# Patient Record
Sex: Female | Born: 1980 | Race: White | Hispanic: No | Marital: Married | Smoking: Current every day smoker
Health system: Southern US, Community
[De-identification: ages and names within clinical notes are randomized; demographics above are authoritative.]

## PROBLEM LIST (undated history)

## (undated) DIAGNOSIS — T4145XA Adverse effect of unspecified anesthetic, initial encounter: Secondary | ICD-10-CM

## (undated) DIAGNOSIS — F319 Bipolar disorder, unspecified: Secondary | ICD-10-CM

## (undated) DIAGNOSIS — E7800 Pure hypercholesterolemia, unspecified: Secondary | ICD-10-CM

## (undated) DIAGNOSIS — F419 Anxiety disorder, unspecified: Secondary | ICD-10-CM

## (undated) DIAGNOSIS — E079 Disorder of thyroid, unspecified: Secondary | ICD-10-CM

## (undated) DIAGNOSIS — T8859XA Other complications of anesthesia, initial encounter: Secondary | ICD-10-CM

## (undated) DIAGNOSIS — R51 Headache: Secondary | ICD-10-CM

## (undated) DIAGNOSIS — K219 Gastro-esophageal reflux disease without esophagitis: Secondary | ICD-10-CM

## (undated) DIAGNOSIS — F53 Postpartum depression: Secondary | ICD-10-CM

## (undated) DIAGNOSIS — O99345 Other mental disorders complicating the puerperium: Secondary | ICD-10-CM

## (undated) DIAGNOSIS — K589 Irritable bowel syndrome without diarrhea: Secondary | ICD-10-CM

## (undated) DIAGNOSIS — I10 Essential (primary) hypertension: Secondary | ICD-10-CM

## (undated) DIAGNOSIS — E119 Type 2 diabetes mellitus without complications: Secondary | ICD-10-CM

## (undated) DIAGNOSIS — G43909 Migraine, unspecified, not intractable, without status migrainosus: Secondary | ICD-10-CM

## (undated) DIAGNOSIS — F4310 Post-traumatic stress disorder, unspecified: Secondary | ICD-10-CM

## (undated) HISTORY — DX: Migraine, unspecified, not intractable, without status migrainosus: G43.909

## (undated) HISTORY — PX: ORIF KNEE DISLOCATION: SUR938

## (undated) HISTORY — PX: LAPAROSCOPIC PARTIAL COLECTOMY: SHX5907

---
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
## 2000-08-09 ENCOUNTER — Emergency Department (HOSPITAL_COMMUNITY): Admission: EM | Admit: 2000-08-09 | Discharge: 2000-08-09 | Payer: Self-pay | Admitting: *Deleted

## 2000-08-09 ENCOUNTER — Encounter: Payer: Self-pay | Admitting: *Deleted

## 2000-08-20 ENCOUNTER — Encounter (HOSPITAL_COMMUNITY): Admission: RE | Admit: 2000-08-20 | Discharge: 2000-09-19 | Payer: Self-pay | Admitting: Family Medicine

## 2000-08-21 ENCOUNTER — Emergency Department (HOSPITAL_COMMUNITY): Admission: EM | Admit: 2000-08-21 | Discharge: 2000-08-21 | Payer: Self-pay | Admitting: Emergency Medicine

## 2001-03-01 ENCOUNTER — Emergency Department (HOSPITAL_COMMUNITY): Admission: EM | Admit: 2001-03-01 | Discharge: 2001-03-01 | Payer: Self-pay | Admitting: Emergency Medicine

## 2002-09-07 ENCOUNTER — Emergency Department (HOSPITAL_COMMUNITY): Admission: EM | Admit: 2002-09-07 | Discharge: 2002-09-08 | Payer: Self-pay | Admitting: *Deleted

## 2005-06-27 ENCOUNTER — Emergency Department (HOSPITAL_COMMUNITY): Admission: EM | Admit: 2005-06-27 | Discharge: 2005-06-27 | Payer: Self-pay | Admitting: Emergency Medicine

## 2005-11-14 ENCOUNTER — Other Ambulatory Visit: Admission: RE | Admit: 2005-11-14 | Discharge: 2005-11-14 | Payer: Self-pay | Admitting: Emergency Medicine

## 2007-06-17 ENCOUNTER — Other Ambulatory Visit: Admission: RE | Admit: 2007-06-17 | Discharge: 2007-06-17 | Payer: Self-pay | Admitting: Internal Medicine

## 2007-09-30 ENCOUNTER — Emergency Department (HOSPITAL_COMMUNITY): Admission: EM | Admit: 2007-09-30 | Discharge: 2007-09-30 | Payer: Self-pay | Admitting: Emergency Medicine

## 2007-10-27 ENCOUNTER — Ambulatory Visit: Payer: Self-pay | Admitting: Orthopedic Surgery

## 2007-10-27 DIAGNOSIS — M25519 Pain in unspecified shoulder: Secondary | ICD-10-CM | POA: Insufficient documentation

## 2007-10-27 DIAGNOSIS — S139XXA Sprain of joints and ligaments of unspecified parts of neck, initial encounter: Secondary | ICD-10-CM | POA: Insufficient documentation

## 2007-10-30 ENCOUNTER — Encounter: Payer: Self-pay | Admitting: Orthopedic Surgery

## 2007-10-30 ENCOUNTER — Telehealth: Payer: Self-pay | Admitting: Orthopedic Surgery

## 2007-12-01 ENCOUNTER — Encounter: Payer: Self-pay | Admitting: Orthopedic Surgery

## 2008-10-10 ENCOUNTER — Ambulatory Visit (HOSPITAL_COMMUNITY): Admission: RE | Admit: 2008-10-10 | Discharge: 2008-10-10 | Payer: Self-pay | Admitting: Internal Medicine

## 2008-11-28 ENCOUNTER — Emergency Department (HOSPITAL_COMMUNITY): Admission: EM | Admit: 2008-11-28 | Discharge: 2008-11-28 | Payer: Self-pay | Admitting: Emergency Medicine

## 2008-12-21 ENCOUNTER — Ambulatory Visit (HOSPITAL_COMMUNITY): Admission: RE | Admit: 2008-12-21 | Discharge: 2008-12-21 | Payer: Self-pay | Admitting: Internal Medicine

## 2008-12-28 ENCOUNTER — Ambulatory Visit (HOSPITAL_COMMUNITY): Admission: RE | Admit: 2008-12-28 | Discharge: 2008-12-28 | Payer: Self-pay | Admitting: Internal Medicine

## 2009-01-11 ENCOUNTER — Other Ambulatory Visit: Admission: RE | Admit: 2009-01-11 | Discharge: 2009-01-11 | Payer: Self-pay | Admitting: Internal Medicine

## 2009-08-14 ENCOUNTER — Other Ambulatory Visit: Admission: RE | Admit: 2009-08-14 | Discharge: 2009-08-14 | Payer: Self-pay | Admitting: Obstetrics & Gynecology

## 2010-01-07 DIAGNOSIS — O99345 Other mental disorders complicating the puerperium: Secondary | ICD-10-CM

## 2010-01-07 DIAGNOSIS — F53 Postpartum depression: Secondary | ICD-10-CM

## 2010-01-07 HISTORY — DX: Postpartum depression: F53.0

## 2010-01-07 HISTORY — DX: Other mental disorders complicating the puerperium: O99.345

## 2011-11-05 ENCOUNTER — Ambulatory Visit (HOSPITAL_COMMUNITY)
Admission: RE | Admit: 2011-11-05 | Discharge: 2011-11-05 | Disposition: A | Discharge status: Home / Self Care | Payer: Medicaid Other | Source: Ambulatory Visit | Attending: Neurology | Admitting: Neurology

## 2011-11-05 DIAGNOSIS — M6281 Muscle weakness (generalized): Secondary | ICD-10-CM | POA: Insufficient documentation

## 2011-11-05 DIAGNOSIS — M25519 Pain in unspecified shoulder: Secondary | ICD-10-CM | POA: Insufficient documentation

## 2011-11-05 DIAGNOSIS — IMO0001 Reserved for inherently not codable concepts without codable children: Secondary | ICD-10-CM | POA: Insufficient documentation

## 2011-11-05 NOTE — Evaluation (Signed)
Occupational Therapy Evaluation  Patient Details  Name: Isabella Walker Patient MRN: [id number] Date of Birth: 14-Jan-1980  Today's Date: 11/05/2011 Time: 1110-1150 OT Time Calculation (min): 40 min OT Evaluation  40' Visit#: 1  of 4   Re-eval: 11/26/11  Assessment Diagnosis: Bilateral Shoulder Pain Next MD Visit: 11/26/11 Prior Therapy: n/a  Authorization: Medicaid - 3 visits today is eval, begin counting visits next session  Authorization Time Period: through 11/26/11  Authorization Visit#: 0  of 3    Past Medical History: No past medical history on file. Past Surgical History: No past surgical history on file.  Subjective S:  I have been having pain in both of my shoulders and it causes headaches.  I think it is bursitis. Pertinent History: [name] states that her shoulders began hurting and losing mobility a few weeks ago.  She consulted with her MD, and was diagnosed with bilateral shoulder bursitis.  She recieved cortisone injections in both shoulders, which has alleviated the pain minimally.  She has been referred to occupational therapy for evaluation and treatment.   Special Tests: UEFI:  44/80 = 55% Patient Stated Goals: I want to try and get the pain to go away. 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  Assessment ADL/Vision/Perception ADL ADL Comments: Any activity  that involves reaching overhead or lifting is painful.  Experience pain when washing dishes and pain when picking up her son Dominant Hand: Right Vision - History Baseline Vision: No visual deficits  Cognition/Observation Cognition Overall Cognitive Status: Appears within functional limits for tasks assessed  Sensation/Coordination/Edema Sensation Light Touch: Appears Intact Coordination Gross Motor Movements are Fluid and Coordinated: Yes Fine Motor Movements are Fluid and Coordinated: Yes  Additional Assessments RUE AROM (degrees) RUE Overall AROM Comments: assessed in seated.  ER/IR with shoulder adducted Right Shoulder Flexion: 131 Degrees Right Shoulder ABduction: 90 Degrees Right Shoulder Internal Rotation: 70 Degrees Right Shoulder External Rotation: 35 Degrees RUE PROM (degrees) RUE Overall PROM Comments: Assessed in supine, 75% RUE Strength RUE Overall Strength Comments: Assessed in seated Right Shoulder Flexion: 4/5 Right Shoulder ABduction: 4/5 Right Shoulder Internal Rotation: 4/5 Right Shoulder External Rotation: 4/5 LUE AROM (degrees) LUE Overall AROM Comments: assessed in seated.  ER/IR with shoulder adducted Left Shoulder Flexion: 110 Degrees Left Shoulder ABduction: 86 Degrees Left Shoulder Internal Rotation: 84 Degrees Left Shoulder External Rotation: 30 Degrees LUE PROM (degrees) LUE Overall PROM Comments: assessed in supine 75% LUE Strength LUE Overall Strength Comments: assessed in seated ER/IR with shoulder adducted  Left Shoulder Flexion: 4/5 Left Shoulder ABduction: 4/5 Left Shoulder Internal Rotation: 4/5 Left Shoulder External Rotation: 4/5 Palpation Palpation: Min-moderate fascial restrictions in scapular region of bilateral shoulders     Exercise/Treatments    Manual Therapy Manual Therapy: Myofascial release Myofascial Release: MFR and manual massage to determine fascial restrictions and range of motion limitations.  Occupational  Therapy Assessment and Plan OT Assessment and Plan Clinical Impression Statement: A:  31 year old with bilateral scapular spasms/fascial restrictions causing  limited mobility and increased pain in her bilateral shoulders.  These deficits are causing decreased I with all B/IADL, leisure, and work activities. Pt will benefit from skilled therapeutic intervention in order to improve on the following deficits: Impaired UE functional use;Increased muscle spasms;Increased fascial restricitons;Decreased range of motion;Decreased strength;Pain Rehab Potential: Good OT Frequency: Min 1X/week OT Duration: 4 weeks OT Treatment/Interventions: Self-care/ADL training;Manual therapy;Therapeutic exercise;Modalities;Therapeutic activities;Patient/family education OT Plan: P:  Skilled OT intervention to decrease pain and restrictions in bilateral scapular regions and increase functional use of BUE during ADLs, work, and leisure activities.  Treatment Plan:  MFR and manual stretching to bilateral shoulder regions to decrease pain and restrictions.  Therapeutic Exercises:  AAROM and PROM in supine.  seated scapular AROM.  ball stretches, wall wash, thumb tacks, prot/ret//elev/dep.  Progress as tolerated.     Goals Short Term Goals Time to Complete Short Term Goals: 2 weeks Short Term Goal 1: Patient will be educated on a HEP. Short Term Goal 2: Patient will increase PROM to [facility] in bilateral shoulders for increased ability to reach into overhead cabinets. Short Term Goal 3: Patient will decrease pain in her bilateral shoulders to 5/10 when picking up her son. Long Term Goals Time to Complete Long Term Goals: 4 weeks Long Term Goal 1: Patient will be I with HEP for AROM and scapular stability exercises. Long Term Goal 2: Patient will increase bilateral shoulder AROM to [facility] for increased ability to reach into overhead cabinets at work. 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 Long Term Goal 4: Patient will decrease fascial restrictions to minimal in her bilateral scapular regions. Long Term Goal 5: Patient will decrease pain in her bilateral shoulders to 3/10 when lifting her son or infants at work.  Problem List Patient Active Problem List  Diagnosis  . SHOULDER PAIN  . CERVICAL SPASM  . Muscle weakness (generalized)    End of Session Activity Tolerance: Patient tolerated treatment well General Behavior During Session: [facility] for tasks performed Cognition: [facility] for tasks performed OT Plan of Care OT Home Exercise Plan: Educated patient on self massage with tennis ball, dowel rod exercises in supine, and shoulder stretches. Consulted and Agree with Plan of Care: Patient  GO    Shirlean Mylar, OTR/L  11/05/2011, 12:17 PM  Physician Documentation Your signature is required to indicate approval of the treatment plan as stated above.  Please sign and either send electronically or make a copy of this report for your files and return this physician signed original.  Please mark one 1.__approve of plan  2. ___approve of plan with the following conditions.   ______________________________                                                          _____________________ Physician Signature  Date  

## 2011-11-12 ENCOUNTER — Ambulatory Visit (HOSPITAL_COMMUNITY)
Admission: RE | Admit: 2011-11-12 | Discharge: 2011-11-12 | Disposition: A | Discharge status: Home / Self Care | Payer: Medicaid Other | Source: Ambulatory Visit | Attending: Neurology | Admitting: Neurology

## 2011-11-12 DIAGNOSIS — IMO0001 Reserved for inherently not codable concepts without codable children: Secondary | ICD-10-CM | POA: Insufficient documentation

## 2011-11-12 DIAGNOSIS — M25519 Pain in unspecified shoulder: Secondary | ICD-10-CM | POA: Insufficient documentation

## 2011-11-12 DIAGNOSIS — M6281 Muscle weakness (generalized): Secondary | ICD-10-CM | POA: Insufficient documentation

## 2011-11-12 NOTE — Progress Notes (Signed)
Occupational Therapy Treatment Patient Details  Name: Isabella Walker Patient MRN: [id number] Date of Birth: 12/18/1980  Today's Date: 11/12/2011 Time: 7829-5621 OT Time Calculation (min): 47 min Manual Therapy 3086-5784 27' Therapeutic Exercise 1130-1149 19'  Visit#: 2  of 4   Re-eval: 11/26/11    Authorization: Medicaid - 3 visits today is eval, begin counting visits next session   Authorization Time Period: through 11/26/11   Authorization Visit#: 1  of 3   Subjective Symptoms/Limitations Symptoms: S:  They hurt today, that may be why I have a headache.  The right is worse. Pain Assessment Currently in Pain?: Yes Pain Score:   8 Pain Location: Shoulder Pain Orientation: Right Pain Type: Acute pain Multiple Pain Sites: Yes  Precautions/Restrictions     Exercise/Treatments Supine Protraction: PROM;AAROM;10 reps;Both Horizontal ABduction: PROM;AAROM;Both;10 reps External Rotation: PROM;AAROM;Both;10 reps Internal Rotation: PROM;AAROM;Both;10 reps Flexion: PROM;AAROM;Both;10 reps ABduction: PROM;AAROM;Both;10 reps Seated Elevation: AROM;10 reps Extension: AROM;10 reps Row: AROM;10 reps   Therapy Ball Flexion: 15 reps ABduction: 15 reps;Other (comment) (bilateral UE)      Manual Therapy Manual Therapy: Myofascial release Myofascial Release: MFR and manual stretching to B UE along scapula, upper trap and upper arm with focus to decrease pain and restrictions to decrease pain and increase AROM  Occupational Therapy Assessment and Plan OT Assessment and Plan Clinical Impression Statement: A:  Added multple new exercises which patient tolerated well.  Patient with pain decreased to 5/10 on left and headache almost gone after manual. OT Plan: P:  Add wall wash, thumbtacks and pro/ret/elev/dep.   Goals Short Term Goals Time to Complete Short Term Goals: 2 weeks Short Term Goal 1: Patient will be educated on a HEP. Short Term Goal 2: Patient will increase PROM to [facility] in  bilateral shoulders for increased ability to reach into overhead cabinets. Short Term Goal 3: Patient will decrease pain in her bilateral shoulders to 5/10 when picking up her son. Long Term Goals Time to Complete Long Term Goals: 4 weeks Long Term Goal 1: Patient will be I with HEP for AROM and scapular stability exercises. Long Term Goal 2: Patient will increase bilateral shoulder AROM to [facility] for increased ability to reach into overhead cabinets at work. 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 Long Term Goal 4: Patient will decrease fascial restrictions to minimal in her bilateral scapular regions. Long Term Goal 5: Patient will decrease pain in her bilateral shoulders to 3/10 when lifting her son or infants at work.  Problem List Patient Active Problem List  Diagnosis  . SHOULDER PAIN  . CERVICAL SPASM  . Muscle weakness (generalized)    End of Session Activity Tolerance: Patient tolerated treatment well General Behavior During Session: [facility] for tasks performed Cognition: [facility] for tasks performed  GO    Noralee Stain, Arianny Pun L 11/12/2011, 11:49 AM

## 2011-11-19 ENCOUNTER — Ambulatory Visit (HOSPITAL_COMMUNITY)
Admission: RE | Admit: 2011-11-19 | Discharge: 2011-11-19 | Disposition: A | Discharge status: Home / Self Care | Payer: Medicaid Other | Source: Ambulatory Visit | Attending: Neurology | Admitting: Neurology

## 2011-11-19 DIAGNOSIS — M6281 Muscle weakness (generalized): Secondary | ICD-10-CM

## 2011-11-19 NOTE — Progress Notes (Signed)
Occupational Therapy Treatment Patient Details  Name: Isabella Walker Patient MRN: [id number] Date of Birth: 25-Jul-1980  Today's Date: 11/19/2011 Time: 1110-1207 OT Time Calculation (min): 57 min Manual Therapy 1110-1136 26' Therapeutic Exercise 1137-1207 30'  Visit#: 3  of 4   Re-eval: 11/26/11    Authorization: Medicaid   Authorization Time Period:    Authorization Visit#: 2  of 3   Subjective Symptoms/Limitations Symptoms: S: I don't have any pain just tight and sore.  Precautions/Restrictions     Exercise/Treatments Supine Protraction: PROM;AAROM;Both;12 reps Horizontal ABduction: PROM;AAROM;Both;12 reps External Rotation: PROM;AAROM;Both;12 reps Internal Rotation: PROM;AAROM;Both;12 reps Flexion: PROM;AAROM;Both;12 reps ABduction: PROM;AAROM;Both;12 reps Therapy Ball Flexion: 20 reps ABduction: 20 reps    Modalities Modalities: Cryotherapy Manual Therapy Manual Therapy: Myofascial release Myofascial Release: MFR and manual stretching to B UE along scapula, upper trap and upper arm with focus to decrease pain and restrictions to decrease pain and increase AROM   Occupational Therapy Assessment and Plan OT Assessment and Plan Clinical Impression Statement: A : Palpable spasms felt in bilateral border of scapula.  Added wall wash, prot/ret/elev/dep and thumbtacks today which patient tolerated well. Patient given handout concerning Medicaid and being selfpay after 3 visits, patient also give Kathie Rhodes Ratliff's number to contact for assistance Rehab Potential: Good OT Plan: P:  Switch to AROM bilateral since patient has full PROM, add tband for scapular strengthening if no spasms.   Goals Short Term Goals Time to Complete Short Term Goals: 2 weeks Short Term Goal 1: Patient will be educated on a HEP. Short Term Goal 2: Patient will increase PROM to [facility] in bilateral shoulders for increased ability to reach into overhead cabinets. Short Term Goal 3: Patient will decrease pain  in her bilateral shoulders to 5/10 when picking up her son. Long Term Goals Time to Complete Long Term Goals: 4 weeks Long Term Goal 1: Patient will be I with HEP for AROM and scapular stability exercises. Long Term Goal 2: Patient will increase bilateral shoulder AROM to [facility] for increased ability to reach into overhead cabinets at work. 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 Long Term Goal 4: Patient will decrease fascial restrictions to minimal in her bilateral scapular regions. Long Term Goal 5: Patient will decrease pain in her bilateral shoulders to 3/10 when lifting her son or infants at work.  Problem List Patient Active Problem List  Diagnosis  . SHOULDER PAIN  . CERVICAL SPASM  . Muscle weakness (generalized)    End of Session Activity Tolerance: Patient tolerated treatment well General Behavior During Session: [facility] for tasks performed Cognition: [facility] for tasks performed  GO    Noralee Stain, [place] Burgin L 11/19/2011, 2:51 PM

## 2011-11-25 ENCOUNTER — Inpatient Hospital Stay (HOSPITAL_COMMUNITY): Admission: RE | Admit: 2011-11-25 | Payer: Medicaid Other | Source: Ambulatory Visit | Admitting: Occupational Therapy

## 2011-11-26 ENCOUNTER — Ambulatory Visit (HOSPITAL_COMMUNITY): Payer: Medicaid Other | Admitting: Specialist

## 2011-11-29 ENCOUNTER — Ambulatory Visit (HOSPITAL_COMMUNITY)
Admission: RE | Admit: 2011-11-29 | Discharge: 2011-11-29 | Disposition: A | Discharge status: Home / Self Care | Payer: Medicaid Other | Source: Ambulatory Visit | Attending: Neurology | Admitting: Neurology

## 2011-11-29 NOTE — Progress Notes (Signed)
Occupational Therapy Treatment Patient Details  Name: Isabella Walker Patient MRN: [id number] Date of Birth: 1980-11-25  Today's Date: 11/29/2011 Time: 0865-7846 OT Time Calculation (min): 34 min Manual Therapy [phone] 13' Therapeutic Exercises/HEP Instruction [phone] 60' Visit#: 4  of 4   Re-eval: 12/27/11    Authorization: Medicaid  Authorization Time Period: authorization has expired  Authorization Visit#: 3  of 3   Subjective  S:  Im feeling pretty good.  I am applying for the financial assistance through the hospital.  I want to work on some exercises at home, until I hear from them Pain Assessment Currently in Pain?: Other (Comment) (just soreness/tightness in bilateral rhomoboids.)  Precautions/Restrictions   progress as tolerated  Exercise/Treatments Supine Protraction: PROM;10 reps Horizontal ABduction: PROM;10 reps External Rotation: PROM;10 reps Internal Rotation: PROM;10 reps Flexion: PROM;10 reps ABduction: PROM;10 reps Other Supine Exercises: bilateral serratus anterior punch x 10 Seated Extension: Theraband;10 reps Theraband Level (Shoulder Extension): Level 3 (Green) Retraction: Theraband;10 reps Theraband Level (Shoulder Retraction): Level 3 (Green) Row: Theraband;10 reps Theraband Level (Shoulder Row): Level 3 (Green) ROM / Strengthening / Isometric Strengthening "W" Arms: 10 reps with cuing to use rhomboids X to V Arms: 10 reps with vg to use rhomboids Proximal Shoulder Strengthening, Seated: BUE 10 reps completed entire sequence without resting.    Stretches Other Shoulder Stretches: educated on bilateral cervical stretches, upper trap stretch, neck retraction, and levator scapulae completed 1 rep of each holding 5 seconds each and added to HEP    Manual Therapy Manual Therapy: Myofascial release Myofascial Release: MFR and manual stretching to B UE along scapula, upper trap and upper arm with focus to decrease pain and restrictions to decrease pain and  increase AROM [phone]   Occupational Therapy Assessment and Plan OT Assessment and Plan Clinical Impression Statement: A:  Significant decrease in pain and restrictions in bilateral scapular regions this date.  Please see progress note in letter section of this chart for details.  Patients therapy will be placed on hold until she determines if she is eligible for financial discount.  Patient would benefit from continued skilled OT services to increase scapular stability and shoulder strength to WNL.   OT Plan: P:  Hold therapy until financial eligibility is determined, then continue treatment 1 x week x 4 weeks to increase strength and scapular stability to WNL.   Goals Short Term Goals Time to Complete Short Term Goals: 2 weeks Short Term Goal 1: Patient will be educated on a HEP. Short Term Goal 1 Progress: Met Short Term Goal 2: Patient will increase PROM to [facility] in bilateral shoulders for increased ability to reach into overhead cabinets. Short Term Goal 2 Progress: Met Short Term Goal 3: Patient will decrease pain in her bilateral shoulders to 5/10 when picking up her son. Short Term Goal 3 Progress: Met Long Term Goals Time to Complete Long Term Goals: 4 weeks Long Term Goal 1: Patient will be I with HEP for AROM and scapular stability exercises. Long Term Goal 1 Progress: Progressing toward goal Long Term Goal 2: Patient will increase bilateral shoulder AROM to [facility] for increased ability to reach into overhead cabinets at work. 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 Long Term Goal 3 Progress: Progressing toward goal Long Term Goal 4: Patient will decrease fascial restrictions to minimal in her bilateral scapular regions. Long Term Goal 4 Progress: Met  Long Term Goal 5: Patient will decrease pain in her bilateral shoulders to 3/10 when lifting her son or  infants at work. Long Term Goal 5 Progress: Met  Problem List Patient Active Problem List  Diagnosis  . SHOULDER PAIN  . CERVICAL SPASM  . Muscle weakness (generalized)    End of Session Activity Tolerance: Patient tolerated treatment well General Behavior During Session: [facility] for tasks performed Cognition: [facility] for tasks performed OT Plan of Care OT Home Exercise Plan: Educated patient on cervical stretches, scapular stability exercises, and theraband for scapular stability for home.  Consulted and Agree with Plan of Care: Patient    Shirlean Mylar, OTR/L  11/29/2011, 9:21 AM

## 2012-04-15 ENCOUNTER — Ambulatory Visit (HOSPITAL_COMMUNITY)
Admission: RE | Admit: 2012-04-15 | Discharge: 2012-04-15 | Disposition: A | Discharge status: Home / Self Care | Payer: Medicaid Other | Source: Ambulatory Visit | Attending: Nurse Practitioner | Admitting: Nurse Practitioner

## 2012-04-15 ENCOUNTER — Other Ambulatory Visit (HOSPITAL_COMMUNITY): Payer: Self-pay | Admitting: Nurse Practitioner

## 2012-04-15 DIAGNOSIS — M542 Cervicalgia: Secondary | ICD-10-CM

## 2012-04-15 DIAGNOSIS — M549 Dorsalgia, unspecified: Secondary | ICD-10-CM | POA: Insufficient documentation

## 2012-05-01 ENCOUNTER — Encounter (HOSPITAL_COMMUNITY): Payer: Self-pay

## 2012-05-01 ENCOUNTER — Emergency Department (HOSPITAL_COMMUNITY): Payer: Medicaid Other

## 2012-05-01 ENCOUNTER — Emergency Department (HOSPITAL_COMMUNITY)
Admission: EM | Admit: 2012-05-01 | Discharge: 2012-05-01 | Disposition: A | Discharge status: Home / Self Care | Payer: Medicaid Other | Attending: Emergency Medicine | Admitting: Emergency Medicine

## 2012-05-01 DIAGNOSIS — E669 Obesity, unspecified: Secondary | ICD-10-CM | POA: Insufficient documentation

## 2012-05-01 DIAGNOSIS — K219 Gastro-esophageal reflux disease without esophagitis: Secondary | ICD-10-CM | POA: Insufficient documentation

## 2012-05-01 DIAGNOSIS — R109 Unspecified abdominal pain: Secondary | ICD-10-CM | POA: Insufficient documentation

## 2012-05-01 DIAGNOSIS — Z79899 Other long term (current) drug therapy: Secondary | ICD-10-CM | POA: Insufficient documentation

## 2012-05-01 DIAGNOSIS — R52 Pain, unspecified: Secondary | ICD-10-CM | POA: Insufficient documentation

## 2012-05-01 DIAGNOSIS — Z8742 Personal history of other diseases of the female genital tract: Secondary | ICD-10-CM | POA: Insufficient documentation

## 2012-05-01 DIAGNOSIS — F17200 Nicotine dependence, unspecified, uncomplicated: Secondary | ICD-10-CM | POA: Insufficient documentation

## 2012-05-01 DIAGNOSIS — Z3202 Encounter for pregnancy test, result negative: Secondary | ICD-10-CM | POA: Insufficient documentation

## 2012-05-01 HISTORY — DX: Other mental disorders complicating the puerperium: O99.345

## 2012-05-01 HISTORY — DX: Gastro-esophageal reflux disease without esophagitis: K21.9

## 2012-05-01 HISTORY — DX: Postpartum depression: F53.0

## 2012-05-01 LAB — URINALYSIS, ROUTINE W REFLEX MICROSCOPIC
Glucose, UA: NEGATIVE mg/dL
Protein, ur: NEGATIVE mg/dL
pH: 5.5 (ref 5.0–8.0)

## 2012-05-01 LAB — URINE MICROSCOPIC-ADD ON

## 2012-05-01 MED ORDER — TRAMADOL HCL 50 MG PO TABS: 50.0000 mg | ORAL_TABLET | Freq: Three times a day (TID) | ORAL | Status: DC | PRN

## 2012-05-01 MED ORDER — TRAMADOL HCL 50 MG PO TABS
50.0000 mg | ORAL_TABLET | Freq: Once | ORAL | Status: AC
  Administered 2012-05-01: 50 mg via ORAL
  Filled 2012-05-01: qty 1

## 2012-05-01 NOTE — ED Provider Notes (Signed)
History     CSN: [id number]  Arrival date & time 05/01/12  1919   First MD Initiated Contact with Patient 05/01/12 1945      Chief Complaint  Patient presents with  . Abdominal Pain    (Consider location/radiation/quality/duration/timing/severity/associated sxs/prior treatment) HPI Patient presents with abdominal pain.  The pain began suddenly after a sneeze.  Since onset the pain has been intermittent, is a sore, throbbing sensation to the right of her umbilicus.  The pain had been improving, but today after coughing, the pain became more pronounced.  No concurrent nausea, anorexia, vomiting, fever, dyspnea, chest pain. Minimal relief with OTC medication.  No history of abdominal surgery.  Past Medical History  Diagnosis Date  . Depression complicating pregnancy, postpartum 2012  . GERD (gastroesophageal reflux disease)     History reviewed. No pertinent past surgical history.  History reviewed. No pertinent family history.  History  Substance Use Topics  . Smoking status: Current Some Day Smoker -- 1.00 packs/day for 15 years  . Smokeless tobacco: Not on file  . Alcohol Use: No    OB History   Grav Para Term Preterm Abortions TAB SAB Ect Mult Living                  Review of Systems  All other systems reviewed and are negative.    Allergies  Bextra  Home Medications   Current Outpatient Rx  Name  Route  Sig  Dispense  Refill  . butalbital-acetaminophen-caffeine (FIORICET, ESGIC) 50-325-40 MG per tablet   Oral   Take 1-2 tablets by mouth every 6 (six) hours as needed for headache.         . clonazePAM (KLONOPIN) 0.5 MG tablet   Oral   Take 0.5 mg by mouth 3 (three) times daily as needed for anxiety.         Marland Kitchen HYDROcodone-acetaminophen (NORCO/VICODIN) 5-325 MG per tablet   Oral   Take 1 tablet by mouth every 6 (six) hours as needed for pain.         Marland Kitchen loratadine (CLARITIN) 10 MG tablet   Oral   Take 10 mg by mouth every morning.          . meloxicam (MOBIC) 15 MG tablet   Oral   Take 15 mg by mouth every morning.         . naproxen sodium (ALEVE) 220 MG tablet   Oral   Take 440 mg by mouth daily as needed (FOR HEADACHE PAIN).         Marland Kitchen omeprazole (PRILOSEC) 20 MG capsule   Oral   Take 20 mg by mouth every morning.         . topiramate (TOPAMAX) 25 MG tablet   Oral   Take 25 mg by mouth 2 (two) times daily.         . traMADol (ULTRAM) 50 MG tablet   Oral   Take 50-1,000 mg by mouth every 6 (six) hours as needed for pain.         Marland Kitchen triamterene-hydrochlorothiazide (MAXZIDE-25) 37.5-25 MG per tablet   Oral   Take 1 tablet by mouth every morning.         . Vilazodone HCl (VIIBRYD) 40 MG TABS   Oral   Take 40 mg by mouth daily after supper.           BP 136/94  Pulse 88  Temp(Src) 98 F (36.7 C) (Oral)  Resp 20  Ht 5'  3" (1.6 m)  Wt 307 lb 11.2 oz (139.572 kg)  BMI 54.52 kg/m2  SpO2 97%  LMP 04/07/2012  Physical Exam  Nursing note and vitals reviewed. Constitutional: She is oriented to person, place, and time. She appears well-developed and well-nourished. No distress.  Obese female in no distress  HENT:  Head: Normocephalic and atraumatic.  Eyes: Conjunctivae and EOM are normal.  Cardiovascular: Normal rate and regular rhythm.   Pulmonary/Chest: Effort normal and breath sounds normal. No stridor. No respiratory distress.  Abdominal:  Large abdomen, nondistended, no guarding, no rebound, minimal tenderness to palpation with about an area just to the right of the umbilicus was no discernible mass, fascial defect, protuberant bowel.   Musculoskeletal: She exhibits no edema.  Neurological: She is alert and oriented to person, place, and time. No cranial nerve deficit.  Skin: Skin is warm and dry.  Psychiatric: She has a normal mood and affect.    ED Course  Procedures (including critical care time)  Labs Reviewed  URINALYSIS, ROUTINE W REFLEX MICROSCOPIC - Abnormal; Notable for  the following:    APPearance CLOUDY (*)    Specific Gravity, Urine >1.030 (*)    Ketones, ur TRACE (*)    Leukocytes, UA TRACE (*)    All other components within normal limits  URINE MICROSCOPIC-ADD ON - Abnormal; Notable for the following:    Squamous Epithelial / LPF MANY (*)    Bacteria, UA MANY (*)    All other components within normal limits  URINE CULTURE  PREGNANCY, URINE   No results found.   No diagnosis found.    MDM  This patient presents with several weeks of ongoing pain density anterior abdomen.  The description of pain began following a forceful activity, sneezing, is suggestive of also related disruption: Rectus sheath hematoma versus muscle strain versus early hernia.  The absence of any protuberant tissue, the absence of distress, fever, any history of nausea, vomiting, and anorexia, fevers his reassuring for the low suspicion of acute ongoing pathology, such as incarcerated bowel or a typical acute appendicitis.  The patient's x-ray did not demonstrate acute pathology, and her IUD is in place.  This was concerned by the patient and her mother.  The patient's was given the stable, discharged in stable condition with PMD followup.        Gerhard Munch, MD 05/01/12 2121

## 2012-05-01 NOTE — ED Notes (Signed)
Pain lower right quad, onset 2 weeks ago when sneezing. Now increased point tender pain and a knot at the site.

## 2012-05-04 LAB — URINE CULTURE: Colony Count: NO GROWTH

## 2012-05-05 ENCOUNTER — Encounter (HOSPITAL_COMMUNITY): Payer: Self-pay

## 2012-05-05 ENCOUNTER — Other Ambulatory Visit (HOSPITAL_COMMUNITY): Payer: Self-pay | Admitting: Family Medicine

## 2012-05-05 ENCOUNTER — Ambulatory Visit (HOSPITAL_COMMUNITY)
Admission: RE | Admit: 2012-05-05 | Discharge: 2012-05-05 | Disposition: A | Discharge status: Home / Self Care | Payer: Medicaid Other | Source: Ambulatory Visit | Attending: Family Medicine | Admitting: Family Medicine

## 2012-05-05 DIAGNOSIS — R197 Diarrhea, unspecified: Secondary | ICD-10-CM

## 2012-05-05 DIAGNOSIS — K429 Umbilical hernia without obstruction or gangrene: Secondary | ICD-10-CM | POA: Insufficient documentation

## 2012-05-05 DIAGNOSIS — K7689 Other specified diseases of liver: Secondary | ICD-10-CM | POA: Insufficient documentation

## 2012-05-05 DIAGNOSIS — R109 Unspecified abdominal pain: Secondary | ICD-10-CM

## 2012-05-05 MED ORDER — IOHEXOL 300 MG/ML  SOLN
100.0000 mL | Freq: Once | INTRAMUSCULAR | Status: AC | PRN
  Administered 2012-05-05: 100 mL via INTRAVENOUS

## 2012-05-19 ENCOUNTER — Encounter (HOSPITAL_COMMUNITY): Payer: Self-pay | Admitting: Pharmacy Technician

## 2012-05-19 NOTE — H&P (Signed)
  NTS SOAP Note  Vital Signs:  Vitals as of: 05/19/2012: Systolic 124: Diastolic 73: Heart Rate 81: Temp 97.10F: Height 29ft 3in: Weight 306Lbs 0 Ounces: Pain Level 9: BMI 54.2  BMI : 54.2 kg/m2  Subjective: This 32 Years 63 Months old Female presents for of    HERNIA: ,Has been having increased swelling and pain just to the right of the umbilicus.  Has been present for some time, but is increasing in size and causing pain.  No emesis noted.  CT scan of the abdomen reveals an umbilical hernia.  Did not assess lower abdominal wall.  Review of Symptoms:  Constitutional:  fatigue    headaches Eyes:unremarkable   Nose/Mouth/Throat:unremarkable Cardiovascular:  unremarkable   Respiratory:  cough Gastrointestin    heartburn Genitourinary:    frequency Musculoskeletal:unremarkable   dry skin, boils Hematolgic/Lymphatic:unremarkable       seasonal   Past Medical History:    Reviewed   Past Medical History  Surgical History: right knee surgery Medical Problems: morbid obesity Allergies: bextra Medications: none   Social History:Reviewed  Social History  Preferred Language: English Race:  White Ethnicity: Not Hispanic / Latino Age: 32 Years 4 Months Marital Status:  S Alcohol:  No Recreational drug(s):  No   Smoking Status: Current every day smoker reviewed on 05/19/2012 Started Date: 01/07/1998 Packs per day: 1.00 Functional Status reviewed on mm/dd/yyyy ------------------------------------------------ Bathing: Normal Cooking: Normal Dressing: Normal Driving: Normal Eating: Normal Managing Meds: Normal Oral Care: Normal Shopping: Normal Toileting: Normal Transferring: Normal Walking: Normal Cognitive Status reviewed on mm/dd/yyyy ------------------------------------------------ Attention: Normal Decision Making: Normal Language: Normal Memory: Normal Motor: Normal Perception: Normal Problem Solving: Normal Visual  and Spatial: Normal   Family History:  Reviewed   Family History  Is there a family history of:N/A    Objective Information:   Morbidly obese white female Heart:  RRR, no murmur Lungs:    CTA bilaterally, no wheezes, rhonchi, rales.  Breathing unlabored. Abdomen:Soft, NT/ND, no HSM, no masses.  Hernia appreciated at umbilical base, with possible additional hernia just to the right and inferior to umbilicus.  Difficult to assess due to body habitus.  Assessment:Ventral hernia  Diagnosis &amp; Procedure Smart Code   Plan:Scheduled for laparoscopic ventral herniorrhaphy with mesh on 05/25/12.   Patient Education:Alternative treatments to surgery were discussed with patient (and family).  Risks and benefits  of procedure including bowel injury and recurrence of the hernia were fully explained to the patient (and family) who gave informed consent. Patient/family questions were addressed.  Follow-up:Pending Surgery

## 2012-05-20 ENCOUNTER — Encounter (HOSPITAL_COMMUNITY): Payer: Self-pay

## 2012-05-20 ENCOUNTER — Encounter (HOSPITAL_COMMUNITY)
Admission: RE | Admit: 2012-05-20 | Discharge: 2012-05-20 | Disposition: A | Discharge status: Home / Self Care | Payer: Medicaid Other | Source: Ambulatory Visit | Attending: General Surgery | Admitting: General Surgery

## 2012-05-20 HISTORY — DX: Essential (primary) hypertension: I10

## 2012-05-20 HISTORY — DX: Headache: R51

## 2012-05-20 LAB — CBC WITH DIFFERENTIAL/PLATELET
Basophils Absolute: 0 10*3/uL (ref 0.0–0.1)
Eosinophils Relative: 2 % (ref 0–5)
Lymphocytes Relative: 22 % (ref 12–46)
MCV: 103.2 fL — ABNORMAL HIGH (ref 78.0–100.0)
Platelets: 303 10*3/uL (ref 150–400)
RDW: 13 % (ref 11.5–15.5)
WBC: 15.3 10*3/uL — ABNORMAL HIGH (ref 4.0–10.5)

## 2012-05-20 LAB — BASIC METABOLIC PANEL
CO2: 23 mEq/L (ref 19–32)
Calcium: 8.8 mg/dL (ref 8.4–10.5)
Creatinine, Ser: 0.78 mg/dL (ref 0.50–1.10)
GFR calc Af Amer: >90 mL/min (ref >90)

## 2012-05-20 LAB — SURGICAL PCR SCREEN
MRSA, PCR: NEGATIVE
Staphylococcus aureus: NEGATIVE

## 2012-05-20 LAB — HCG, SERUM, QUALITATIVE: Preg, Serum: NEGATIVE

## 2012-05-20 NOTE — Patient Instructions (Addendum)
Isabella Walker  05/20/2012   Your procedure is scheduled on:   05/25/2012   Report to [facility] at  720  AM.  Call this number if you have problems the morning of surgery: [phone]   Remember:   Do not eat food or drink liquids after midnight.   Take these medicines the morning of surgery with A SIP OF WATER: none  Do not wear jewelry, make-up or nail polish.  Do not wear lotions, powders, or perfumes.   Do not shave 48 hours prior to surgery. Men may shave face and neck.  Do not bring valuables to the hospital.  Contacts, dentures or bridgework may not be worn into surgery.  Leave suitcase in the car. After surgery it may be brought to your room.  For patients admitted to the hospital, checkout time is 11:00 AM the day of discharge.   Patients discharged the day of surgery will not be allowed to drive  home.  Name and phone number of your driver: family  Special Instructions: Shower using CHG 2 nights before surgery and the night before surgery.  If you shower the day of surgery use CHG.  Use special wash - you have one bottle of CHG for all showers.  You should use approximately 1/3 of the bottle for each shower.   Please read over the following fact sheets that you were given: Pain Booklet, Coughing and Deep Breathing, MRSA Information, Surgical Site Infection Prevention, Anesthesia Post-op Instructions and Care and Recovery After Surgery Hernia Repair with Laparoscope A hernia occurs when an internal organ pushes out through a weak spot in the belly (abdominal) wall muscles. Hernias most commonly occur in the groin and around the navel. Hernias can also occur through a cut by the surgeon (incision) after an abdominal operation. A hernia may be caused by:  Lifting heavy objects.  Prolonged coughing.  Straining to move your bowels. Hernias can often be pushed back into place (reduced). Most hernias tend to get worse over time. Problems occur when abdominal contents get stuck  in the opening and the blood supply is blocked or impaired (incarcerated hernia). Because of these risks, you require surgery to repair the hernia. Your hernia will be repaired using a laparoscope. Laparoscopic surgery is a type of minimally invasive surgery. It does not involve making a typical surgical cut (incision) in the skin. A laparoscope is a telescope-like rod and lens system. It is usually connected to a video camera and a light source so your caregiver can clearly see the operative area. The instruments are inserted through  to  inch (5 mm or 10 mm) openings in the skin at specific locations. A working and viewing space is created by blowing a small amount of carbon dioxide gas into the abdominal cavity. The abdomen is essentially blown up like a balloon (insufflated). This elevates the abdominal wall above the internal organs like a dome. The carbon dioxide gas is common to the human body and can be absorbed by tissue and removed by the respiratory system. Once the repair is completed, the small incisions will be closed with either stitches (sutures) or staples (just like a paper stapler only this staple holds the skin together). LET YOUR CAREGIVERS KNOW ABOUT:  Allergies.  Medications taken including herbs, eye drops, over the counter medications, and creams.  Use of steroids (by mouth or creams).  Previous problems with anesthetics or Novocaine.  Possibility of pregnancy, if this applies.  History  of blood clots (thrombophlebitis).  History of bleeding or blood problems.  Previous surgery.  Other health problems. BEFORE THE PROCEDURE  Laparoscopy can be done either in a hospital or out-patient clinic. You may be given a mild sedative to help you relax before the procedure. Once in the operating room, you will be given a general anesthesia to make you sleep (unless you and your caregiver choose a different anesthetic).  AFTER THE PROCEDURE  After the procedure you will be  watched in a recovery area. Depending on what type of hernia was repaired, you might be admitted to the hospital or you might go home the same day. With this procedure you may have less pain and scarring. This usually results in a quicker recovery and less risk of infection. HOME CARE INSTRUCTIONS   Bed rest is not required. You may continue your normal activities but avoid heavy lifting (more than 10 pounds) or straining.  Cough gently. If you are a smoker it is best to stop, as even the best hernia repair can break down with the continual strain of coughing.  Avoid driving until given the OK by your surgeon.  There are no dietary restrictions unless given otherwise.  TAKE ALL MEDICATIONS AS DIRECTED.  Only take over-the-counter or prescription medicines for pain, discomfort, or fever as directed by your caregiver. SEEK MEDICAL CARE IF:   There is increasing abdominal pain or pain in your incisions.  There is more bleeding from incisions, other than minimal spotting.  You feel light headed or faint.  You develop an unexplained fever, chills, and/or an oral temperature above 102 F (38.9 C).  You have redness, swelling, or increasing pain in the wound.  Pus coming from wound.  A foul smell coming from the wound or dressings. SEEK IMMEDIATE MEDICAL CARE IF:   You develop a rash.  You have difficulty breathing.  You have any allergic problems. MAKE SURE YOU:   Understand these instructions.  Will watch your condition.  Will get help right away if you are not doing well or get worse. Document Released: 12/24/2004 Document Revised: 03/18/2011 Document Reviewed: 11/23/2008 [facility] Patient Information 2013 [place], [place]. PATIENT INSTRUCTIONS POST-ANESTHESIA  IMMEDIATELY FOLLOWING SURGERY:  Do not drive or operate machinery for the first twenty four hours after surgery.  Do not make any important decisions for twenty four hours after surgery or while taking narcotic pain  medications or sedatives.  If you develop intractable nausea and vomiting or a severe headache please notify your doctor immediately.  FOLLOW-UP:  Please make an appointment with your surgeon as instructed. You do not need to follow up with anesthesia unless specifically instructed to do so.  WOUND CARE INSTRUCTIONS (if applicable):  Keep a dry clean dressing on the anesthesia/puncture wound site if there is drainage.  Once the wound has quit draining you may leave it open to air.  Generally you should leave the bandage intact for twenty four hours unless there is drainage.  If the epidural site drains for more than 36-48 hours please call the anesthesia department.  QUESTIONS?:  Please feel free to call your physician or the hospital operator if you have any questions, and they will be happy to assist you.

## 2012-05-25 ENCOUNTER — Ambulatory Visit (HOSPITAL_COMMUNITY): Payer: Medicaid Other | Admitting: Anesthesiology

## 2012-05-25 ENCOUNTER — Observation Stay (HOSPITAL_COMMUNITY)
Admission: RE | Admit: 2012-05-25 | Discharge: 2012-05-26 | Disposition: A | Discharge status: Home / Self Care | Payer: Medicaid Other | Source: Ambulatory Visit | Attending: General Surgery | Admitting: General Surgery

## 2012-05-25 ENCOUNTER — Encounter (HOSPITAL_COMMUNITY): Payer: Self-pay | Admitting: Anesthesiology

## 2012-05-25 ENCOUNTER — Ambulatory Visit (HOSPITAL_COMMUNITY): Payer: Medicaid Other

## 2012-05-25 ENCOUNTER — Encounter (HOSPITAL_COMMUNITY)
Admission: RE | Disposition: A | Discharge status: Home / Self Care | Payer: Self-pay | Source: Ambulatory Visit | Attending: General Surgery

## 2012-05-25 ENCOUNTER — Encounter (HOSPITAL_COMMUNITY): Payer: Self-pay | Admitting: *Deleted

## 2012-05-25 DIAGNOSIS — Z23 Encounter for immunization: Secondary | ICD-10-CM | POA: Insufficient documentation

## 2012-05-25 DIAGNOSIS — E669 Obesity, unspecified: Secondary | ICD-10-CM | POA: Insufficient documentation

## 2012-05-25 DIAGNOSIS — Z01812 Encounter for preprocedural laboratory examination: Secondary | ICD-10-CM | POA: Insufficient documentation

## 2012-05-25 DIAGNOSIS — K439 Ventral hernia without obstruction or gangrene: Principal | ICD-10-CM | POA: Insufficient documentation

## 2012-05-25 DIAGNOSIS — Z01818 Encounter for other preprocedural examination: Secondary | ICD-10-CM | POA: Insufficient documentation

## 2012-05-25 HISTORY — PX: VENTRAL HERNIA REPAIR: SHX424

## 2012-05-25 HISTORY — PX: INSERTION OF MESH: SHX5868

## 2012-05-25 SURGERY — REPAIR, HERNIA, VENTRAL, LAPAROSCOPIC
Anesthesia: General | Site: Abdomen | Wound class: Clean

## 2012-05-25 MED ORDER — SODIUM CHLORIDE 0.9 % IN NEBU
INHALATION_SOLUTION | RESPIRATORY_TRACT | Status: AC
  Filled 2012-05-25: qty 3

## 2012-05-25 MED ORDER — MIDAZOLAM HCL 2 MG/2ML IJ SOLN
INTRAMUSCULAR | Status: AC
  Filled 2012-05-25: qty 2

## 2012-05-25 MED ORDER — GLYCOPYRROLATE 0.2 MG/ML IJ SOLN
INTRAMUSCULAR | Status: AC
  Filled 2012-05-25: qty 4

## 2012-05-25 MED ORDER — TRAMADOL HCL 50 MG PO TABS
50.0000 mg | ORAL_TABLET | Freq: Four times a day (QID) | ORAL | Status: DC | PRN
  Administered 2012-05-26: 50 mg via ORAL
  Filled 2012-05-25: qty 1

## 2012-05-25 MED ORDER — ONDANSETRON HCL 4 MG/2ML IJ SOLN
INTRAMUSCULAR | Status: AC
  Filled 2012-05-25: qty 2

## 2012-05-25 MED ORDER — ENOXAPARIN SODIUM 40 MG/0.4ML ~~LOC~~ SOLN
40.0000 mg | Freq: Once | SUBCUTANEOUS | Status: AC
  Administered 2012-05-25: 40 mg via SUBCUTANEOUS

## 2012-05-25 MED ORDER — ALBUTEROL SULFATE (5 MG/ML) 0.5% IN NEBU
2.5000 mg | INHALATION_SOLUTION | RESPIRATORY_TRACT | Status: DC | PRN
  Administered 2012-05-25: 2.5 mg via RESPIRATORY_TRACT

## 2012-05-25 MED ORDER — BUPIVACAINE HCL (PF) 0.5 % IJ SOLN
INTRAMUSCULAR | Status: AC
  Filled 2012-05-25: qty 30

## 2012-05-25 MED ORDER — LIDOCAINE HCL (PF) 1 % IJ SOLN
INTRAMUSCULAR | Status: AC
  Filled 2012-05-25: qty 5

## 2012-05-25 MED ORDER — TRIAMTERENE-HCTZ 37.5-25 MG PO TABS
1.0000 | ORAL_TABLET | Freq: Every morning | ORAL | Status: DC
  Administered 2012-05-26: 1 via ORAL
  Filled 2012-05-25 (×2): qty 1

## 2012-05-25 MED ORDER — ONDANSETRON HCL 4 MG/2ML IJ SOLN: 4.0000 mg | Freq: Once | INTRAMUSCULAR | Status: DC | PRN

## 2012-05-25 MED ORDER — HYDROMORPHONE HCL PF 1 MG/ML IJ SOLN
1.0000 mg | INTRAMUSCULAR | Status: DC | PRN
  Administered 2012-05-25 (×2): 1 mg via INTRAVENOUS
  Filled 2012-05-25 (×2): qty 1

## 2012-05-25 MED ORDER — HALOPERIDOL LACTATE 5 MG/ML IJ SOLN
1.0000 mg | Freq: Once | INTRAMUSCULAR | Status: DC
  Filled 2012-05-25: qty 0.2

## 2012-05-25 MED ORDER — LIDOCAINE HCL (CARDIAC) 20 MG/ML IV SOLN
INTRAVENOUS | Status: DC | PRN
  Administered 2012-05-25: 40 mg via INTRAVENOUS

## 2012-05-25 MED ORDER — ONDANSETRON HCL 4 MG PO TABS: 4.0000 mg | ORAL_TABLET | Freq: Four times a day (QID) | ORAL | Status: DC | PRN

## 2012-05-25 MED ORDER — PNEUMOCOCCAL VAC POLYVALENT 25 MCG/0.5ML IJ INJ
0.5000 mL | INJECTION | INTRAMUSCULAR | Status: AC
  Administered 2012-05-26: 0.5 mL via INTRAMUSCULAR
  Filled 2012-05-25: qty 0.5

## 2012-05-25 MED ORDER — FENTANYL CITRATE 0.05 MG/ML IJ SOLN: 25.0000 ug | INTRAMUSCULAR | Status: DC | PRN

## 2012-05-25 MED ORDER — BUPIVACAINE HCL (PF) 0.5 % IJ SOLN
INTRAMUSCULAR | Status: DC | PRN
  Administered 2012-05-25: 20 mL

## 2012-05-25 MED ORDER — NEOSTIGMINE METHYLSULFATE 1 MG/ML IJ SOLN
INTRAMUSCULAR | Status: AC
  Filled 2012-05-25: qty 1

## 2012-05-25 MED ORDER — SUCCINYLCHOLINE CHLORIDE 20 MG/ML IJ SOLN
INTRAMUSCULAR | Status: DC | PRN
  Administered 2012-05-25: 120 mg via INTRAVENOUS

## 2012-05-25 MED ORDER — GLYCOPYRROLATE 0.2 MG/ML IJ SOLN
INTRAMUSCULAR | Status: AC
  Filled 2012-05-25: qty 2

## 2012-05-25 MED ORDER — FENTANYL CITRATE 0.05 MG/ML IJ SOLN
INTRAMUSCULAR | Status: AC
  Filled 2012-05-25: qty 2

## 2012-05-25 MED ORDER — PANTOPRAZOLE SODIUM 40 MG PO TBEC: 40.0000 mg | DELAYED_RELEASE_TABLET | Freq: Every day | ORAL | Status: DC

## 2012-05-25 MED ORDER — DEXAMETHASONE SODIUM PHOSPHATE 4 MG/ML IJ SOLN
10.0000 mg | Freq: Once | INTRAMUSCULAR | Status: AC
  Administered 2012-05-25: 10 mg via INTRAVENOUS

## 2012-05-25 MED ORDER — CEFAZOLIN SODIUM-DEXTROSE 2-3 GM-% IV SOLR: 2.0000 g | INTRAVENOUS | Status: DC

## 2012-05-25 MED ORDER — MIDAZOLAM HCL 5 MG/5ML IJ SOLN
INTRAMUSCULAR | Status: DC | PRN
  Administered 2012-05-25 (×2): 2 mg via INTRAVENOUS

## 2012-05-25 MED ORDER — DEXTROSE 5 % IV SOLN: 3.0000 g | INTRAVENOUS | Status: DC

## 2012-05-25 MED ORDER — CEFAZOLIN SODIUM 1-5 GM-% IV SOLN
1.0000 g | INTRAVENOUS | Status: AC
  Administered 2012-05-25: 3 g via INTRAVENOUS

## 2012-05-25 MED ORDER — CLONAZEPAM 0.5 MG PO TABS: 0.5000 mg | ORAL_TABLET | Freq: Three times a day (TID) | ORAL | Status: DC | PRN

## 2012-05-25 MED ORDER — CEFAZOLIN SODIUM-DEXTROSE 2-3 GM-% IV SOLR
INTRAVENOUS | Status: AC
  Filled 2012-05-25: qty 50

## 2012-05-25 MED ORDER — ONDANSETRON HCL 4 MG/2ML IJ SOLN: 4.0000 mg | Freq: Four times a day (QID) | INTRAMUSCULAR | Status: DC | PRN

## 2012-05-25 MED ORDER — GLYCOPYRROLATE 0.2 MG/ML IJ SOLN
0.2000 mg | Freq: Once | INTRAMUSCULAR | Status: AC
  Administered 2012-05-25: 0.2 mg via INTRAVENOUS

## 2012-05-25 MED ORDER — DIPHENHYDRAMINE HCL 25 MG PO CAPS: 25.0000 mg | ORAL_CAPSULE | Freq: Four times a day (QID) | ORAL | Status: DC | PRN

## 2012-05-25 MED ORDER — PROPOFOL 10 MG/ML IV EMUL
INTRAVENOUS | Status: AC
  Filled 2012-05-25: qty 20

## 2012-05-25 MED ORDER — CEFAZOLIN SODIUM 1-5 GM-% IV SOLN
INTRAVENOUS | Status: AC
  Filled 2012-05-25: qty 50

## 2012-05-25 MED ORDER — CHLORHEXIDINE GLUCONATE 4 % EX LIQD: 1.0000 "application " | Freq: Once | CUTANEOUS | Status: DC

## 2012-05-25 MED ORDER — ACETAMINOPHEN 10 MG/ML IV SOLN
1000.0000 mg | Freq: Four times a day (QID) | INTRAVENOUS | Status: AC
  Administered 2012-05-25 – 2012-05-26 (×4): 1000 mg via INTRAVENOUS
  Filled 2012-05-25 (×3): qty 100

## 2012-05-25 MED ORDER — DEXAMETHASONE SODIUM PHOSPHATE 4 MG/ML IJ SOLN
INTRAMUSCULAR | Status: AC
  Filled 2012-05-25: qty 1

## 2012-05-25 MED ORDER — ENOXAPARIN SODIUM 40 MG/0.4ML ~~LOC~~ SOLN
40.0000 mg | SUBCUTANEOUS | Status: DC
  Administered 2012-05-26: 40 mg via SUBCUTANEOUS
  Filled 2012-05-25: qty 0.4

## 2012-05-25 MED ORDER — ROCURONIUM BROMIDE 100 MG/10ML IV SOLN
INTRAVENOUS | Status: DC | PRN
  Administered 2012-05-25: 32 mg via INTRAVENOUS
  Administered 2012-05-25: 10 mg via INTRAVENOUS
  Administered 2012-05-25: 8 mg via INTRAVENOUS

## 2012-05-25 MED ORDER — HALOPERIDOL LACTATE 5 MG/ML IJ SOLN
INTRAMUSCULAR | Status: DC | PRN
  Administered 2012-05-25: 1 mg via INTRAMUSCULAR
  Administered 2012-05-25 (×2): 2 mg via INTRAMUSCULAR

## 2012-05-25 MED ORDER — DEXAMETHASONE SODIUM PHOSPHATE 4 MG/ML IJ SOLN
INTRAMUSCULAR | Status: AC
  Filled 2012-05-25: qty 2

## 2012-05-25 MED ORDER — 0.9 % SODIUM CHLORIDE (POUR BTL) OPTIME
TOPICAL | Status: DC | PRN
  Administered 2012-05-25: 1000 mL

## 2012-05-25 MED ORDER — MIDAZOLAM HCL 2 MG/2ML IJ SOLN
1.0000 mg | INTRAMUSCULAR | Status: DC | PRN
  Administered 2012-05-25 (×2): 2 mg via INTRAVENOUS

## 2012-05-25 MED ORDER — ONDANSETRON HCL 4 MG/2ML IJ SOLN
4.0000 mg | Freq: Once | INTRAMUSCULAR | Status: AC
  Administered 2012-05-25: 4 mg via INTRAVENOUS

## 2012-05-25 MED ORDER — FENTANYL CITRATE 0.05 MG/ML IJ SOLN
INTRAMUSCULAR | Status: AC
  Filled 2012-05-25: qty 5

## 2012-05-25 MED ORDER — ACETAMINOPHEN 10 MG/ML IV SOLN
INTRAVENOUS | Status: AC
  Filled 2012-05-25: qty 100

## 2012-05-25 MED ORDER — MIDAZOLAM HCL 2 MG/2ML IJ SOLN: 2.0000 mg | Freq: Once | INTRAMUSCULAR | Status: DC

## 2012-05-25 MED ORDER — PROPOFOL 10 MG/ML IV BOLUS
INTRAVENOUS | Status: DC | PRN
  Administered 2012-05-25: 170 mg via INTRAVENOUS

## 2012-05-25 MED ORDER — LACTATED RINGERS IV SOLN
INTRAVENOUS | Status: DC
  Administered 2012-05-25: 18:00:00 via INTRAVENOUS

## 2012-05-25 MED ORDER — SUCCINYLCHOLINE CHLORIDE 20 MG/ML IJ SOLN
INTRAMUSCULAR | Status: AC
  Filled 2012-05-25: qty 1

## 2012-05-25 MED ORDER — ENOXAPARIN SODIUM 40 MG/0.4ML ~~LOC~~ SOLN
SUBCUTANEOUS | Status: AC
  Filled 2012-05-25: qty 0.4

## 2012-05-25 MED ORDER — LORATADINE 10 MG PO TABS
10.0000 mg | ORAL_TABLET | Freq: Every morning | ORAL | Status: DC
  Administered 2012-05-26: 10 mg via ORAL
  Filled 2012-05-25: qty 1

## 2012-05-25 MED ORDER — FENTANYL CITRATE 0.05 MG/ML IJ SOLN
INTRAMUSCULAR | Status: DC | PRN
  Administered 2012-05-25 (×7): 50 ug via INTRAVENOUS

## 2012-05-25 MED ORDER — ROCURONIUM BROMIDE 50 MG/5ML IV SOLN
INTRAVENOUS | Status: AC
  Filled 2012-05-25: qty 1

## 2012-05-25 MED ORDER — GLYCOPYRROLATE 0.2 MG/ML IJ SOLN
INTRAMUSCULAR | Status: AC
  Filled 2012-05-25: qty 1

## 2012-05-25 MED ORDER — LACTATED RINGERS IV SOLN
INTRAVENOUS | Status: DC
  Administered 2012-05-25 (×2): via INTRAVENOUS

## 2012-05-25 MED ORDER — TOPIRAMATE 25 MG PO TABS
25.0000 mg | ORAL_TABLET | Freq: Two times a day (BID) | ORAL | Status: DC
  Administered 2012-05-25 – 2012-05-26 (×2): 25 mg via ORAL
  Filled 2012-05-25 (×4): qty 1

## 2012-05-25 MED ORDER — VILAZODONE HCL 40 MG PO TABS
40.0000 mg | ORAL_TABLET | Freq: Every day | ORAL | Status: DC
  Administered 2012-05-25: 40 mg via ORAL
  Filled 2012-05-25 (×2): qty 1

## 2012-05-25 MED ORDER — ALBUTEROL SULFATE (5 MG/ML) 0.5% IN NEBU
INHALATION_SOLUTION | RESPIRATORY_TRACT | Status: AC
  Filled 2012-05-25: qty 0.5

## 2012-05-25 SURGICAL SUPPLY — 53 items
ADH SKN CLS APL DERMABOND .7 (GAUZE/BANDAGES/DRESSINGS) ×1
APL SKNCLS STERI-STRIP NONHPOA (GAUZE/BANDAGES/DRESSINGS) ×1
BAG HAMPER (MISCELLANEOUS) ×2 IMPLANT
BENZOIN TINCTURE PRP APPL 2/3 (GAUZE/BANDAGES/DRESSINGS) ×1 IMPLANT
CLOTH BEACON ORANGE TIMEOUT ST (SAFETY) ×2 IMPLANT
COVER LIGHT HANDLE STERIS (MISCELLANEOUS) ×4 IMPLANT
DECANTER SPIKE VIAL GLASS SM (MISCELLANEOUS) ×2 IMPLANT
DERMABOND ADVANCED (GAUZE/BANDAGES/DRESSINGS) ×1
DERMABOND ADVANCED .7 DNX12 (GAUZE/BANDAGES/DRESSINGS) ×1 IMPLANT
DEVICE TROCAR PUNCTURE CLOSURE (ENDOMECHANICALS) ×2 IMPLANT
DURAPREP 26ML APPLICATOR (WOUND CARE) ×2 IMPLANT
ELECT REM PT RETURN 9FT ADLT (ELECTROSURGICAL) ×2
ELECTRODE REM PT RTRN 9FT ADLT (ELECTROSURGICAL) ×1 IMPLANT
FILTER SMOKE EVAC LAPAROSHD (FILTER) ×2 IMPLANT
FORMALIN 10 PREFIL 120ML (MISCELLANEOUS) ×2 IMPLANT
GLOVE BIO SURGEON STRL SZ7.5 (GLOVE) ×2 IMPLANT
GLOVE BIOGEL PI IND STRL 7.0 (GLOVE) IMPLANT
GLOVE BIOGEL PI INDICATOR 7.0 (GLOVE) ×1
GLOVE EXAM NITRILE LRG STRL (GLOVE) ×1 IMPLANT
GLOVE SS BIOGEL STRL SZ 6.5 (GLOVE) IMPLANT
GLOVE SUPERSENSE BIOGEL SZ 6.5 (GLOVE) ×1
GOWN STRL REIN XL XLG (GOWN DISPOSABLE) ×5 IMPLANT
INST SET LAPROSCOPIC AP (KITS) ×2 IMPLANT
IV NS IRRIG 3000ML ARTHROMATIC (IV SOLUTION) IMPLANT
KIT ROOM TURNOVER APOR (KITS) ×2 IMPLANT
LIGASURE LAP ATLAS 10MM 37CM (INSTRUMENTS) ×1 IMPLANT
MANIFOLD NEPTUNE II (INSTRUMENTS) ×2 IMPLANT
MESH PHYSIO OVAL 10X15CM (Mesh General) ×1 IMPLANT
NDL INSUFFLATION 14GA 120MM (NEEDLE) ×1 IMPLANT
NEEDLE INSUFFLATION 14GA 120MM (NEEDLE) ×2 IMPLANT
NS IRRIG 1000ML POUR BTL (IV SOLUTION) ×2 IMPLANT
PACK LAP CHOLE LZT030E (CUSTOM PROCEDURE TRAY) ×2 IMPLANT
PAD ARMBOARD 7.5X6 YLW CONV (MISCELLANEOUS) ×2 IMPLANT
PENCIL HANDSWITCHING (ELECTRODE) ×2 IMPLANT
SET BASIN LINEN APH (SET/KITS/TRAYS/PACK) ×2 IMPLANT
SET TUBE IRRIG SUCTION NO TIP (IRRIGATION / IRRIGATOR) IMPLANT
SLEEVE Z-THREAD 5X100MM (TROCAR) ×2 IMPLANT
SPONGE GAUZE 2X2 8PLY STRL LF (GAUZE/BANDAGES/DRESSINGS) ×4 IMPLANT
STAPLER VISISTAT (STAPLE) ×2 IMPLANT
STRIP CLOSURE SKIN 1/2X4 (GAUZE/BANDAGES/DRESSINGS) ×2 IMPLANT
SUT NOVA NAB GS-22 2 2-0 T-19 (SUTURE) IMPLANT
SUT PROLENE 2 0 CR (SUTURE) ×2 IMPLANT
SUT VICRYL 0 UR6 27IN ABS (SUTURE) ×1 IMPLANT
TACKER 5MM HERNIA 3.5CML NAB (ENDOMECHANICALS) ×1 IMPLANT
TAPE CLOTH SURG 4X10 WHT LF (GAUZE/BANDAGES/DRESSINGS) ×1 IMPLANT
TOWEL OR 17X26 4PK STRL BLUE (TOWEL DISPOSABLE) ×2 IMPLANT
TRAY FOLEY CATH 14FR (SET/KITS/TRAYS/PACK) ×2 IMPLANT
TROCAR Z-THRD FIOS HNDL 11X100 (TROCAR) ×4 IMPLANT
TROCAR Z-THREAD FIOS 5X100MM (TROCAR) ×1 IMPLANT
TROCAR Z-THREAD SLEEVE 11X100 (TROCAR) ×2 IMPLANT
TUBING HI FLO HEAT INSUFFLATOR (IRRIGATION / IRRIGATOR) ×2 IMPLANT
WARMER LAPAROSCOPE (MISCELLANEOUS) ×2 IMPLANT
YANKAUER SUCT BULB TIP 10FT TU (MISCELLANEOUS) ×1 IMPLANT

## 2012-05-25 NOTE — Op Note (Signed)
Patient:  Isabella Walker  DOB:  [birth year]  MRN:  [id number]   Preop Diagnosis:  Ventral hernia  Postop Diagnosis:  Same  Procedure:  Laparoscopic ventral herniorrhaphy with mesh  Surgeon:  Franky Macho, M.D.  Anes:  General endotracheal  Indications:  Patient is a 32 year old morbidly obese white female who presents with a ventral hernia at the umbilical level. The patient now comes the operative room for laparoscopic ventral herniorrhaphy with mesh. The risks and benefits of the procedure including bleeding, infection, and a possibly recurrence of the hernia were fully explained to the patient, who gave informed consent.  Procedure note:  The patient is placed the supine position. After induction of general endotracheal anesthesia, the abdomen was prepped and draped using usual sterile technique with DuraPrep. Surgical site confirmation was performed.  An incision was made initially in the epigastric region to help insufflate the abdomen. As I could not enter the abdomen safely with a Veress needle at that level, a supraumbilical incision was made in a Veress needle was introduced into the abdominal cavity without difficulty. A saline drop test was performed which confirmed placement into the abdomen. The abdomen was then insufflated to 16 mm mercury pressure. An 11 mm trocar was then introduced into the left upper quadrant under direct visualization without difficulty. On inspection of liver, 2 punctate holes were present which were not actively bleeding. An additional 11 mm trocar was placed in the right upper quadrant and another one placed left lower quadrant regions. Using Bovie electrocautery. The 2 punctate wounds in the left lobe of the liver were cauterized without difficulty. Next, the ventral wall was inspected. An umbilical hernia was seen, with laxity noted to the right of the umbilicus. The hernia had no abdominal contents present within it. A 10 x 15 cm physio-mesh was then  inserted. It was tacked in 4 corners using 2-0 Prolene interrupted sutures. A pro-tacker was then used circumferentially around the mesh in order to secured to the ventral wall. There was a greater than 3 cm overlay of mesh from the edges of the hernia defect. The liver was again inspected no abnormal bleeding was noted. All fluid and air were then evacuated from the abdominal cavity prior to removal of the trochars.  All wounds were irrigated with normal saline. All wounds were injected with 0.5% Sensorcaine. The trocar sites were closed using staples. Betadine ointment and dry sterile dressings were applied. A small tacking holes were closed using Steri-Strips.  All tape and needle counts were correct at the end of the procedure. This was extubated in the operating room and transferred to PACU in stable condition.  Complications:  None  EBL:  Minimal  Specimen:  None

## 2012-05-25 NOTE — Anesthesia Postprocedure Evaluation (Signed)
  Anesthesia Post-op Note  Patient: Isabella Walker  Procedure(s) Performed: Procedure(s): LAPAROSCOPIC VENTRAL HERNIA (N/A) INSERTION OF MESH (N/A)  Patient Location: PACU  Anesthesia Type:General  Level of Consciousness: awake  Airway and Oxygen Therapy: Patient Spontanous Breathing and Patient connected to nasal cannula oxygen  Post-op Pain: mild  Post-op Assessment: Post-op Vital signs reviewed, Patient's Cardiovascular Status Stable, Respiratory Function Stable and Patent Airway  Post-op Vital Signs: Reviewed and stable  Complications: respiratory complications, delirium improving slightly with Haldol but still requiring 4 people holding her.  Intermittent combativeness and yelling.

## 2012-05-25 NOTE — Interval H&P Note (Signed)
History and Physical Interval Note:  05/25/2012 8:43 AM  Isabella Walker  has presented today for surgery, with the diagnosis of ventral hernia  The various methods of treatment have been discussed with the patient and family. After consideration of risks, benefits and other options for treatment, the patient has consented to  Procedure(s): LAPAROSCOPIC VENTRAL HERNIA (N/A) as a surgical intervention .  The patient's history has been reviewed, patient examined, no change in status, stable for surgery.  I have reviewed the patient's chart and labs.  Questions were answered to the patient's satisfaction.     Franky Macho A

## 2012-05-25 NOTE — Progress Notes (Signed)
Lesion has a confused sensorium. This causes her to become hypoxic and agitated. She also has a history of upper respiratory congestion. Chest x-ray done in recovery room revealed no pneumothorax. Will observe in step down unit overnight. Suspect patient also suffers from sleep apnea.

## 2012-05-25 NOTE — Anesthesia Preprocedure Evaluation (Addendum)
Anesthesia Evaluation  Patient identified by MRN, date of birth, ID band Patient awake    Reviewed: Allergy & Precautions, H&P , NPO status , Patient's Chart, lab work & pertinent test results  Airway Mallampati: IV TM Distance: >3 FB Neck ROM: Full    Dental  (+) Teeth Intact   Pulmonary Current Smoker (am cough),  Seasonal allergies w/ sinus drainage, chronic hoarseness. breath sounds clear to auscultation        Cardiovascular hypertension, Pt. on medications Rhythm:Regular Rate:Normal     Neuro/Psych  Headaches, PSYCHIATRIC DISORDERS Depression    GI/Hepatic GERD-  Medicated and Controlled,  Endo/Other  Morbid obesity  Renal/GU      Musculoskeletal   Abdominal   Peds  Hematology   Anesthesia Other Findings   Reproductive/Obstetrics                           Anesthesia Physical Anesthesia Plan  ASA: III  Anesthesia Plan: General   Post-op Pain Management:    Induction: Intravenous, Rapid sequence and Cricoid pressure planned  Airway Management Planned: Oral ETT and Video Laryngoscope Planned  Additional Equipment:   Intra-op Plan:   Post-operative Plan: Extubation in OR  Informed Consent: I have reviewed the patients History and Physical, chart, labs and discussed the procedure including the risks, benefits and alternatives for the proposed anesthesia with the patient or authorized representative who has indicated his/her understanding and acceptance.     Plan Discussed with:   Anesthesia Plan Comments: (Patient woke from anesthesia with delirium.  Combative, yelling.  Taken to PACU with 6 people holding her down.  Given versed, Haldol 5mg  given in divided doses and fentanyl.  )       Anesthesia Quick Evaluation

## 2012-05-25 NOTE — Transfer of Care (Signed)
Immediate Anesthesia Transfer of Care Note  Patient: Isabella Walker  Procedure(s) Performed: Procedure(s): LAPAROSCOPIC VENTRAL HERNIA (N/A) INSERTION OF MESH (N/A)  Patient Location: PACU  Anesthesia Type:General  Level of Consciousness: awake and pateint uncooperative, combative, yelling requiring 6 people to keep her in the bed  Airway & Oxygen Therapy: Patient Spontanous Breathing and Patient connected to face mask oxygen, difficulty breathing, breathing treatment given  Post-op Assessment: Report given to PACU RN  Post vital signs: Reviewed  Complications: No apparent anesthesia complicationsPost op delirium, O2 sats in the 90's, ventilating adequately, yelling, pink.  Versed 2 mg given in PACU followed by haldol 1mg  with improvement in patients mental status.

## 2012-05-25 NOTE — Anesthesia Procedure Notes (Signed)
Procedure Name: Intubation Date/Time: 05/25/2012 9:42 AM Performed by: Glynn Octave E Pre-anesthesia Checklist: Patient identified, Patient being monitored, Timeout performed, Emergency Drugs available and Suction available Patient Re-evaluated:Patient Re-evaluated prior to inductionOxygen Delivery Method: Circle System Utilized Preoxygenation: Pre-oxygenation with 100% oxygen Intubation Type: IV induction, Rapid sequence and Cricoid Pressure applied Ventilation: Mask ventilation without difficulty Grade View: Grade I Tube type: Oral Tube size: 7.0 mm Number of attempts: 1 Airway Equipment and Method: stylet,  Rigid stylet and Video-laryngoscopy Placement Confirmation: ETT inserted through vocal cords under direct vision,  positive ETCO2 and breath sounds checked- equal and bilateral Secured at: 21 cm Tube secured with: Tape Dental Injury: Teeth and Oropharynx as per pre-operative assessment  Difficulty Due To: Difficulty was anticipated Comments: More concerned about possible edema and inflammation with hoarseness.  Glidescope planned to evaluate airway, vocal cords prior to intubation and surgery.  VC visualized, minimal edema and ETT passed easily through VC.  No trauma to airway.

## 2012-05-26 MED ORDER — OXYCODONE-ACETAMINOPHEN 7.5-325 MG PO TABS: 1.0000 | ORAL_TABLET | ORAL | Status: AC | PRN

## 2012-05-26 NOTE — Progress Notes (Signed)
UR Chart Review Completed  

## 2012-05-26 NOTE — Progress Notes (Signed)
Made pt appt with Dr. Franky Macho for 06/04/12 for 0900 and gave pt discharge instructions and prescription for pain medication from Dr. Lovell Sheehan. Pt left via wheelchair with personal belongings with family going home. Pt stated she understood all instructions for meds and when to call doctor with any questions or concerns.

## 2012-05-26 NOTE — Anesthesia Postprocedure Evaluation (Signed)
  Anesthesia Post-op Note  Patient: Isabella Walker  Procedure(s) Performed: Procedure(s): LAPAROSCOPIC VENTRAL HERNIA (N/A) INSERTION OF MESH (N/A)  Patient Location: ICU 10  Anesthesia Type:General  Level of Consciousness: awake, alert , oriented and patient cooperative  Airway and Oxygen Therapy: Patient Spontanous Breathing  Post-op Pain: mild  Post-op Assessment: Post-op Vital signs reviewed, Patient's Cardiovascular Status Stable, Respiratory Function Stable, Patent Airway, No signs of Nausea or vomiting, Adequate PO intake and Pain level controlled  Post-op Vital Signs: Reviewed and stable  Complications: No apparent anesthesia complications

## 2012-05-26 NOTE — Plan of Care (Signed)
Problem: Phase I Progression Outcomes Goal: OOB as tolerated unless otherwise ordered Outcome: Completed/Met Date Met:  05/26/12 Pt is out of bed to bedside commode with no problems Goal: Voiding-avoid urinary catheter unless indicated Outcome: Completed/Met Date Met:  05/26/12 Pt is using bedside commode with not problem Goal: Hemodynamically stable Outcome: Completed/Met Date Met:  05/26/12 Pt's vital signs remaining stable.

## 2012-05-26 NOTE — Discharge Summary (Signed)
Physician Discharge Summary  Patient ID: Isabella Walker MRN: [id number] DOB/AGE: 1980-01-22 32 y.o.  Admit date: 05/25/2012 Discharge date: 05/26/2012  Admission Diagnoses: Ventral hernia, delirium post anesthesia  Discharge Diagnoses: Same Active Problems:   * No active hospital problems. *   Discharged Condition: good  Hospital Course: Patient is a 32 year old obese white female who presented to [facility] with a ventral hernia. She underwent a laparoscopic ventral herniorrhaphy with mesh on 05/25/2012. In the immediate postoperative period, she was combative and delirious. This was felt to be secondary to delirium in the immediate postoperative period. Haldol was given which helped treat this. She was admitted to the step down unit for observation. Her delirium has resolved. She is being discharged home on postoperative day one in good improving condition.  Treatments: surgery: Laparoscopic ventral herniorrhaphy with mesh on 05/25/2012  Discharge Exam: Blood pressure 127/60, pulse 55, temperature 98.5 F (36.9 C), temperature source Oral, resp. rate 19, height 5\' 3"  (1.6 m), weight 138.801 kg (306 lb), SpO2 95.00%. General appearance: alert, cooperative, no distress and moderately obese Resp: clear to auscultation bilaterally Cardio: regular rate and rhythm, S1, S2 normal, no murmur, click, rub or gallop GI: Soft. Dressings dry and intact.  Disposition: 01-Home or Self Care     Medication List    STOP taking these medications       traMADol 50 MG tablet  Commonly known as:  ULTRAM      TAKE these medications       ALEVE 220 MG tablet  Generic drug:  naproxen sodium  Take 440 mg by mouth daily as needed (FOR HEADACHE PAIN).     butalbital-acetaminophen-caffeine 50-325-40 MG per tablet  Commonly known as:  FIORICET, ESGIC  Take 1-2 tablets by mouth every 6 (six) hours as needed for headache.     clonazePAM 0.5 MG tablet  Commonly known as:  KLONOPIN  Take  0.5 mg by mouth 3 (three) times daily as needed for anxiety.     HYDROcodone-acetaminophen 7.5-325 MG per tablet  Commonly known as:  NORCO  Take 1 tablet by mouth every 4 (four) hours as needed for pain.     loratadine 10 MG tablet  Commonly known as:  CLARITIN  Take 10 mg by mouth every morning.     omeprazole 20 MG capsule  Commonly known as:  PRILOSEC  Take 20 mg by mouth every morning.     oxyCODONE-acetaminophen 7.5-325 MG per tablet  Commonly known as:  PERCOCET  Take 1-2 tablets by mouth every 4 (four) hours as needed for pain.     topiramate 25 MG tablet  Commonly known as:  TOPAMAX  Take 25 mg by mouth 2 (two) times daily.     triamterene-hydrochlorothiazide 37.5-25 MG per tablet  Commonly known as:  MAXZIDE-25  Take 1 tablet by mouth every morning.     VIIBRYD 40 MG Tabs  Generic drug:  Vilazodone HCl  Take 40 mg by mouth daily after supper.           Follow-up Information   Follow up with Dalia Heading, MD. Schedule an appointment as soon as possible for a visit on 06/04/2012.   Contact information:   [address] [place] [address] [phone]       Signed: Franky Macho A 05/26/2012, 9:20 AM

## 2012-05-27 ENCOUNTER — Encounter (HOSPITAL_COMMUNITY): Payer: Self-pay | Admitting: General Surgery

## 2012-11-18 ENCOUNTER — Other Ambulatory Visit (HOSPITAL_COMMUNITY): Payer: Self-pay | Admitting: Nurse Practitioner

## 2012-11-18 ENCOUNTER — Ambulatory Visit (HOSPITAL_COMMUNITY)
Admission: RE | Admit: 2012-11-18 | Discharge: 2012-11-18 | Disposition: A | Discharge status: Home / Self Care | Payer: Medicaid Other | Source: Ambulatory Visit | Attending: Nurse Practitioner | Admitting: Nurse Practitioner

## 2012-11-18 DIAGNOSIS — M545 Low back pain, unspecified: Secondary | ICD-10-CM | POA: Insufficient documentation

## 2014-08-03 ENCOUNTER — Other Ambulatory Visit (HOSPITAL_COMMUNITY): Payer: Self-pay | Admitting: Family Medicine

## 2014-08-03 ENCOUNTER — Ambulatory Visit (HOSPITAL_COMMUNITY)
Admission: RE | Admit: 2014-08-03 | Discharge: 2014-08-03 | Disposition: A | Discharge status: Home / Self Care | Payer: Medicaid Other | Source: Ambulatory Visit | Attending: Family Medicine | Admitting: Family Medicine

## 2014-08-03 DIAGNOSIS — R937 Abnormal findings on diagnostic imaging of other parts of musculoskeletal system: Secondary | ICD-10-CM | POA: Diagnosis not present

## 2014-08-03 DIAGNOSIS — R52 Pain, unspecified: Secondary | ICD-10-CM

## 2014-08-03 DIAGNOSIS — M25561 Pain in right knee: Secondary | ICD-10-CM | POA: Insufficient documentation

## 2014-08-11 ENCOUNTER — Other Ambulatory Visit (HOSPITAL_COMMUNITY): Payer: Self-pay | Admitting: Family Medicine

## 2014-08-11 DIAGNOSIS — M25561 Pain in right knee: Principal | ICD-10-CM

## 2014-08-11 DIAGNOSIS — G8929 Other chronic pain: Secondary | ICD-10-CM

## 2014-08-24 ENCOUNTER — Ambulatory Visit (HOSPITAL_COMMUNITY)
Admission: RE | Admit: 2014-08-24 | Discharge: 2014-08-24 | Disposition: A | Discharge status: Home / Self Care | Payer: Medicaid Other | Source: Ambulatory Visit | Attending: Family Medicine | Admitting: Family Medicine

## 2014-08-24 DIAGNOSIS — M23331 Other meniscus derangements, other medial meniscus, right knee: Secondary | ICD-10-CM | POA: Diagnosis not present

## 2014-08-24 DIAGNOSIS — G8929 Other chronic pain: Secondary | ICD-10-CM

## 2014-08-24 DIAGNOSIS — M25561 Pain in right knee: Secondary | ICD-10-CM | POA: Insufficient documentation

## 2014-09-22 ENCOUNTER — Ambulatory Visit (INDEPENDENT_AMBULATORY_CARE_PROVIDER_SITE_OTHER): Payer: Medicaid Other | Admitting: Orthopedic Surgery

## 2014-09-22 VITALS — BP 114/65 | Ht 64.0 in | Wt 306.0 lb

## 2014-09-22 DIAGNOSIS — M5441 Lumbago with sciatica, right side: Secondary | ICD-10-CM

## 2014-09-22 DIAGNOSIS — M5442 Lumbago with sciatica, left side: Secondary | ICD-10-CM | POA: Diagnosis not present

## 2014-09-22 DIAGNOSIS — M5126 Other intervertebral disc displacement, lumbar region: Secondary | ICD-10-CM
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
## 2014-09-22 NOTE — Patient Instructions (Signed)
We will schedule MRI for you and call you with appt and results To have plain xray at time of MRI

## 2014-09-23 NOTE — Addendum Note (Signed)
Addended by: Adella Hare B on: 09/23/2014 11:34 AM   Modules accepted: Orders

## 2016-05-25 ENCOUNTER — Encounter (HOSPITAL_COMMUNITY): Payer: Self-pay

## 2016-05-25 ENCOUNTER — Emergency Department (HOSPITAL_COMMUNITY)
Admission: EM | Admit: 2016-05-25 | Discharge: 2016-05-25 | Disposition: A | Discharge status: Home / Self Care | Payer: Medicaid Other | Attending: Emergency Medicine | Admitting: Emergency Medicine

## 2016-05-25 DIAGNOSIS — Z79899 Other long term (current) drug therapy: Secondary | ICD-10-CM | POA: Diagnosis not present

## 2016-05-25 DIAGNOSIS — R51 Headache: Secondary | ICD-10-CM | POA: Diagnosis present

## 2016-05-25 DIAGNOSIS — I10 Essential (primary) hypertension: Secondary | ICD-10-CM | POA: Diagnosis not present

## 2016-05-25 DIAGNOSIS — F17210 Nicotine dependence, cigarettes, uncomplicated: Secondary | ICD-10-CM | POA: Diagnosis not present

## 2016-05-25 DIAGNOSIS — R519 Headache, unspecified: Secondary | ICD-10-CM

## 2016-05-25 HISTORY — DX: Anxiety disorder, unspecified: F41.9

## 2016-05-25 MED ORDER — ONDANSETRON HCL 4 MG PO TABS
4.0000 mg | ORAL_TABLET | Freq: Once | ORAL | Status: AC
  Administered 2016-05-25: 4 mg via ORAL
  Filled 2016-05-25: qty 1

## 2016-05-25 MED ORDER — TRAMADOL HCL 50 MG PO TABS
100.0000 mg | ORAL_TABLET | Freq: Once | ORAL | Status: AC
  Administered 2016-05-25: 100 mg via ORAL
  Filled 2016-05-25: qty 2

## 2016-05-25 MED ORDER — DIAZEPAM 5 MG PO TABS
10.0000 mg | ORAL_TABLET | Freq: Once | ORAL | Status: AC
  Administered 2016-05-25: 10 mg via ORAL
  Filled 2016-05-25: qty 2

## 2016-05-25 MED ORDER — BUTALBITAL-APAP-CAFF-COD 50-325-40-30 MG PO CAPS: ORAL_CAPSULE | ORAL | 0 refills | Status: DC

## 2016-05-25 MED ORDER — ONDANSETRON HCL 4 MG PO TABS: 4.0000 mg | ORAL_TABLET | Freq: Four times a day (QID) | ORAL | 0 refills | Status: DC

## 2016-05-25 NOTE — ED Provider Notes (Signed)
[facility] Provider Note   CSN: [id number] Arrival date & time: 05/25/16  1401     History   Chief Complaint Chief Complaint  Patient presents with  . Headache    HPI Isabella Walker is a 36 y.o. female.  Patient is a 36 year old female who presents to the emergency department with complaint of headache.  The patient states this problem started yesterday and has been continuous. The pain is mostly in the frontal portion of the head, goes into the eyes, and at times in the back of the neck and shoulders. The patient has a history of "migraine headaches". The patient states she's been under a great deal of stress recently. She's not had any recent injury or trauma to the head or neck area. She's had some nausea, but no actual vomiting. She has tried Tylenol however these have not been able to relieve her pain and discomfort. The patient has a wrist device to check blood pressure, it stated that her blood pressure was extremely high. This frightened her and her family and she came into the emergency department for evaluation of the blood pressure and for the headache.      Past Medical History:  Diagnosis Date  . Anxiety   . Depression complicating pregnancy, postpartum 2012  . GERD (gastroesophageal reflux disease)   . Headache(784.0)    hx of migraines-last one few months ago.  Marland Kitchen. Hypertension     Patient Active Problem List   Diagnosis Date Noted  . Muscle weakness (generalized) 11/05/2011  . SHOULDER PAIN 10/27/2007  . CERVICAL SPASM 10/27/2007    Past Surgical History:  Procedure Laterality Date  . INSERTION OF MESH N/A 05/25/2012   Procedure: INSERTION OF MESH;  Surgeon: Dalia HeadingMark A Jenkins, MD;  Location: [facility];  Service: General;  Laterality: N/A;  . ORIF KNEE DISLOCATION Right   . VENTRAL HERNIA REPAIR N/A 05/25/2012   Procedure: LAPAROSCOPIC VENTRAL HERNIA;  Surgeon: Dalia HeadingMark A Jenkins, MD;  Location: [facility];  Service: General;  Laterality: N/A;    OB History      No data available       Home Medications    Prior to Admission medications   Medication Sig Start Date End Date Taking? Authorizing Provider  butalbital-acetaminophen-caffeine (FIORICET, ESGIC) 50-325-40 MG per tablet Take 1-2 tablets by mouth every 6 (six) hours as needed for headache.    [provider]  clonazePAM (KLONOPIN) 0.5 MG tablet Take 0.5 mg by mouth 3 (three) times daily as needed for anxiety.    [provider]  HYDROcodone-acetaminophen (NORCO) 7.5-325 MG per tablet Take 1 tablet by mouth every 4 (four) hours as needed for pain.    [provider]  loratadine (CLARITIN) 10 MG tablet Take 10 mg by mouth every morning.    [provider]  naproxen sodium (ALEVE) 220 MG tablet Take 440 mg by mouth daily as needed (FOR HEADACHE PAIN).    [provider]  omeprazole (PRILOSEC) 20 MG capsule Take 20 mg by mouth every morning.    [provider]  topiramate (TOPAMAX) 25 MG tablet Take 25 mg by mouth 2 (two) times daily.    [provider]  triamterene-hydrochlorothiazide (MAXZIDE-25) 37.5-25 MG per tablet Take 1 tablet by mouth every morning.    [provider]  Vilazodone HCl (VIIBRYD) 40 MG TABS Take 40 mg by mouth daily after supper.    [provider]    Family History No family history on file.  Social History Social History  Substance Use Topics  . Smoking status: Current Some Day Smoker    Packs/day: 1.00    Years: 15.00  . Smokeless tobacco: Never Used  . Alcohol use No     Allergies   Bextra [valdecoxib]   Review of Systems Review of Systems  Eyes: Positive for photophobia.  Gastrointestinal: Positive for nausea.  Neurological: Positive for headaches.  All other systems reviewed and are negative.    Physical Exam Updated Vital Signs BP 128/83 (BP Location: Left Arm)   Pulse 95   Temp 98.6 F (37 C) (Oral)   Resp 18   Ht 5\' 3"  (1.6 m)   Wt 295 lb (133.8 kg)    SpO2 95%   BMI 52.26 kg/m   Physical Exam  Constitutional: She is oriented to person, place, and time. She appears well-developed and well-nourished.  Non-toxic appearance. No distress.  HENT:  Head: Normocephalic and atraumatic.  Right Ear: Tympanic membrane and external ear normal.  Left Ear: Tympanic membrane and external ear normal.  Eyes: Conjunctivae, EOM and lids are normal. Pupils are equal, round, and reactive to light. Right eye exhibits no discharge. Left eye exhibits no discharge. No scleral icterus.  Neck: Normal range of motion. Neck supple. Carotid bruit is not present. No tracheal deviation present.  Cardiovascular: Normal rate, regular rhythm, normal heart sounds, intact distal pulses and normal pulses.   Pulmonary/Chest: Effort normal and breath sounds normal. No stridor. No respiratory distress. She has no wheezes. She has no rales.  Abdominal: Soft. Bowel sounds are normal. She exhibits no distension. There is no tenderness. There is no rebound and no guarding.  Musculoskeletal: Normal range of motion. She exhibits no edema or tenderness.  Lymphadenopathy:       Head (right side): No submandibular adenopathy present.       Head (left side): No submandibular adenopathy present.    She has no cervical adenopathy.  Neurological: She is alert and oriented to person, place, and time. She has normal strength. No cranial nerve deficit or sensory deficit. She exhibits normal muscle tone. She displays no seizure activity. Coordination normal.  Skin: Skin is warm and dry. No rash noted.  Psychiatric: She has a normal mood and affect. Her speech is normal.  Nursing note and vitals reviewed.    ED Treatments / Results  Labs (all labs ordered are listed, but only abnormal results are displayed) Labs Reviewed - No data to display  EKG  EKG Interpretation None       Radiology No results found.  Procedures Procedures (including critical care time)  Medications Ordered  in ED Medications  diazepam (VALIUM) tablet 10 mg (not administered)  ondansetron (ZOFRAN) tablet 4 mg (not administered)  traMADol (ULTRAM) tablet 100 mg (not administered)     Initial Impression / Assessment and Plan / ED Course  I have reviewed the triage vital signs and the nursing notes.  Pertinent labs & imaging results that were available during my care of the patient were reviewed by me and considered in my medical decision making (see chart for details).      Final Clinical Impressions(s) / ED Diagnoses MDM Vital signs within normal limits. Pulse oximetry is 95% on room air. There no gross neurologic deficits appreciated on examination. The patient speaks clearly. The gait is intact without any problem whatsoever. I've asked patient to see her primary physician for additional evaluation of her headaches. I reassured her that her examination and her vital  signs within normal limits at this time, and invited her to return to the emergency department immediately if any changes, problems, or concerns. The patient will be treated with Fioricet-Codeine and Zofran until seen by her primary physician.    Final diagnoses:  Bad headache    New Prescriptions New Prescriptions   No medications on file     Ivery Quale, Cordelia Poche 05/25/16 1458    Vanetta Mulders, MD 05/26/16 [phone]

## 2016-05-25 NOTE — Discharge Instructions (Signed)
Your blood pressure is 128/83 area the remainder of your vital signs are also within normal limits. Your oxygen level is normal 95%. Your neurologic examination is negative for acute findings at this time. Please use fioricet-codeine and Zofran we are headaches and nausea. Please see Dr. Selena BattenKim as soon as possible for additional evaluation of your headaches.

## 2016-05-25 NOTE — ED Triage Notes (Signed)
Pt reports faint headache yesterday that has gotten worse today.  Has been taking tylenol without relief. Pt says she checked her bp at home and it read high.

## 2016-05-25 NOTE — ED Triage Notes (Signed)
Took BP on home BP wrist cuff and it read high- Pt here for evaluation Pt smokes, Dad with hx of HTN and died at 4063   Dr Selena BattenKim is PCP

## 2016-07-08 ENCOUNTER — Encounter (HOSPITAL_COMMUNITY): Payer: Self-pay | Admitting: Emergency Medicine

## 2016-07-08 ENCOUNTER — Emergency Department (HOSPITAL_COMMUNITY)
Admission: EM | Admit: 2016-07-08 | Discharge: 2016-07-08 | Disposition: A | Discharge status: Home / Self Care | Payer: Medicaid Other | Attending: Emergency Medicine | Admitting: Emergency Medicine

## 2016-07-08 ENCOUNTER — Emergency Department (HOSPITAL_COMMUNITY): Payer: Medicaid Other

## 2016-07-08 DIAGNOSIS — K061 Gingival enlargement: Secondary | ICD-10-CM | POA: Diagnosis not present

## 2016-07-08 DIAGNOSIS — F17210 Nicotine dependence, cigarettes, uncomplicated: Secondary | ICD-10-CM | POA: Diagnosis not present

## 2016-07-08 DIAGNOSIS — Z79899 Other long term (current) drug therapy: Secondary | ICD-10-CM | POA: Diagnosis not present

## 2016-07-08 DIAGNOSIS — R739 Hyperglycemia, unspecified: Secondary | ICD-10-CM | POA: Insufficient documentation

## 2016-07-08 DIAGNOSIS — K047 Periapical abscess without sinus: Secondary | ICD-10-CM

## 2016-07-08 DIAGNOSIS — K0889 Other specified disorders of teeth and supporting structures: Secondary | ICD-10-CM | POA: Diagnosis not present

## 2016-07-08 LAB — CBC WITH DIFFERENTIAL/PLATELET
Basophils Absolute: 0 10*3/uL (ref 0.0–0.1)
Basophils Relative: 0 %
Eosinophils Absolute: 0.3 10*3/uL (ref 0.0–0.7)
Eosinophils Relative: 3 %
HCT: 41.4 % (ref 36.0–46.0)
Hemoglobin: 13.5 g/dL (ref 12.0–15.0)
LYMPHS ABS: 2.4 10*3/uL (ref 0.7–4.0)
LYMPHS PCT: 22 %
MCH: 32.7 pg (ref 26.0–34.0)
MCHC: 32.6 g/dL (ref 30.0–36.0)
MCV: 100.2 fL — AB (ref 78.0–100.0)
MONO ABS: 0.5 10*3/uL (ref 0.1–1.0)
Monocytes Relative: 5 %
Neutro Abs: 7.8 10*3/uL — ABNORMAL HIGH (ref 1.7–7.7)
Neutrophils Relative %: 70 %
PLATELETS: 263 10*3/uL (ref 150–400)
RBC: 4.13 MIL/uL (ref 3.87–5.11)
RDW: 13.2 % (ref 11.5–15.5)
WBC: 11 10*3/uL — ABNORMAL HIGH (ref 4.0–10.5)

## 2016-07-08 LAB — BASIC METABOLIC PANEL
Anion gap: 11 (ref 5–15)
BUN: 11 mg/dL (ref 6–20)
CALCIUM: 8.8 mg/dL — AB (ref 8.9–10.3)
CO2: 22 mmol/L (ref 22–32)
Chloride: 102 mmol/L (ref 101–111)
Creatinine, Ser: 0.73 mg/dL (ref 0.44–1.00)
GFR calc Af Amer: >60 mL/min (ref >60)
GLUCOSE: 271 mg/dL — AB (ref 65–99)
Potassium: 3.6 mmol/L (ref 3.5–5.1)
Sodium: 135 mmol/L (ref 135–145)

## 2016-07-08 LAB — POC URINE PREG, ED: PREG TEST UR: NEGATIVE

## 2016-07-08 MED ORDER — MORPHINE SULFATE (PF) 4 MG/ML IV SOLN
4.0000 mg | Freq: Once | INTRAVENOUS | Status: AC
  Administered 2016-07-08: 4 mg via INTRAVENOUS
  Filled 2016-07-08: qty 1

## 2016-07-08 MED ORDER — ONDANSETRON HCL 4 MG/2ML IJ SOLN
4.0000 mg | Freq: Once | INTRAMUSCULAR | Status: AC
  Administered 2016-07-08: 4 mg via INTRAVENOUS
  Filled 2016-07-08: qty 2

## 2016-07-08 MED ORDER — CLINDAMYCIN PHOSPHATE 600 MG/50ML IV SOLN
600.0000 mg | Freq: Once | INTRAVENOUS | Status: AC
  Administered 2016-07-08: 600 mg via INTRAVENOUS
  Filled 2016-07-08: qty 50

## 2016-07-08 MED ORDER — IOPAMIDOL (ISOVUE-300) INJECTION 61%
75.0000 mL | Freq: Once | INTRAVENOUS | Status: AC | PRN
  Administered 2016-07-08: 75 mL via INTRAVENOUS

## 2016-07-08 MED ORDER — HYDROCODONE-ACETAMINOPHEN 5-325 MG PO TABS: 1.0000 | ORAL_TABLET | ORAL | 0 refills | Status: DC | PRN

## 2016-07-08 MED ORDER — CLINDAMYCIN HCL 300 MG PO CAPS: 300.0000 mg | ORAL_CAPSULE | Freq: Four times a day (QID) | ORAL | 0 refills | Status: DC

## 2016-07-08 NOTE — ED Provider Notes (Signed)
[facility] Provider Note   CSN: [id number] Arrival date & time: 07/08/16  [id number]     History   Chief Complaint Chief Complaint  Patient presents with  . Dental Pain    HPI Isabella Walker is a 36 y.o. female presenting with 1 day history of dental pain and gingival swelling.   The patient has a history of injury and/or decay in the tooth involved which has recently started to cause increased  pain.  There has been no fevers, chills, nausea or vomiting, also no complaint of difficulty swallowing, although She is having difficulty opening her mouth secondary to pain.  Her pain radiates into her infra orbital left side and tracks along her nose.  She is concerned about eyelid swelling.  She takes hydrocodone chronically for pain, this has not improved her facial pain.  She does not have a dentist, her PCP is Dr. Selena BattenKim.    HPI  Past Medical History:  Diagnosis Date  . Anxiety   . Depression complicating pregnancy, postpartum 2012  . GERD (gastroesophageal reflux disease)   . Headache(784.0)    hx of migraines-last one few months ago.  Marland Kitchen. Hypertension     Patient Active Problem List   Diagnosis Date Noted  . Muscle weakness (generalized) 11/05/2011  . SHOULDER PAIN 10/27/2007  . CERVICAL SPASM 10/27/2007    Past Surgical History:  Procedure Laterality Date  . INSERTION OF MESH N/A 05/25/2012   Procedure: INSERTION OF MESH;  Surgeon: Dalia HeadingMark A Jenkins, MD;  Location: [facility];  Service: General;  Laterality: N/A;  . ORIF KNEE DISLOCATION Right   . VENTRAL HERNIA REPAIR N/A 05/25/2012   Procedure: LAPAROSCOPIC VENTRAL HERNIA;  Surgeon: Dalia HeadingMark A Jenkins, MD;  Location: [facility];  Service: General;  Laterality: N/A;    OB History    No data available       Home Medications    Prior to Admission medications   Medication Sig Start Date End Date Taking? Authorizing Provider  busPIRone (BUSPAR) 10 MG tablet Take 10 mg by mouth 3 (three) times daily.   Yes [provider]    celecoxib (CELEBREX) 100 MG capsule Take 100 mg by mouth 2 (two) times daily.   Yes [provider]  cholecalciferol (VITAMIN D) 1000 units tablet Take 1,000 Units by mouth daily.   Yes [provider]  Cholecalciferol (VITAMIN D3) 2000 units TABS Take 1 tablet by mouth daily.   Yes [provider]  gabapentin (NEURONTIN) 300 MG capsule Take 300 mg by mouth 2 (two) times daily. 1 capsule 2 times daily and 2 capsules at bedtime   Yes [provider]  hydrOXYzine (ATARAX/VISTARIL) 25 MG tablet Take 25 mg by mouth every 8 (eight) hours as needed.   Yes [provider]  levothyroxine (SYNTHROID, LEVOTHROID) 125 MCG tablet Take 125 mcg by mouth daily before breakfast.   Yes [provider]  loratadine (CLARITIN) 10 MG tablet Take 10 mg by mouth every morning.   Yes [provider]  mirtazapine (REMERON) 45 MG tablet Take 45 mg by mouth at bedtime.   Yes [provider]  naproxen sodium (ALEVE) 220 MG tablet Take 440 mg by mouth daily as needed (FOR HEADACHE PAIN).   Yes [provider]  omeprazole (PRILOSEC) 20 MG capsule Take 40 mg by mouth every morning.    Yes [provider]  topiramate (TOPAMAX) 25 MG tablet Take 50 mg by mouth 2 (two) times daily.    Yes  [provider]  traMADol (ULTRAM) 50 MG tablet Take 50 mg by mouth 3 (three) times daily.   Yes [provider]  triamterene-hydrochlorothiazide (MAXZIDE-25) 37.5-25 MG per tablet Take 1 tablet by mouth every morning.   Yes [provider]  butalbital-acetaminophen-caffeine (FIORICET, ESGIC) 50-325-40 MG per tablet Take 1-2 tablets by mouth every 6 (six) hours as needed for headache.    [provider]  butalbital-acetaminophen-caffeine (FIORICET/CODEINE) 50-325-40-30 MG capsule 1 or 2 po q6h prn headache Patient not taking: Reported on 07/08/2016 05/25/16   Ivery Quale, PA-C  clindamycin (CLEOCIN) 300 MG capsule Take 1  capsule (300 mg total) by mouth 4 (four) times daily. 07/08/16   Burgess Amor, PA-C  clonazePAM (KLONOPIN) 0.5 MG tablet Take 0.5 mg by mouth 3 (three) times daily as needed for anxiety.    [provider]  HYDROcodone-acetaminophen (NORCO/VICODIN) 5-325 MG tablet Take 1 tablet by mouth every 4 (four) hours as needed. 07/08/16   Burgess Amor, PA-C  ondansetron (ZOFRAN) 4 MG tablet Take 1 tablet (4 mg total) by mouth every 6 (six) hours. Patient not taking: Reported on 07/08/2016 05/25/16   Ivery Quale, PA-C  Vilazodone HCl (VIIBRYD) 40 MG TABS Take 40 mg by mouth daily after supper.    [provider]    Family History History reviewed. No pertinent family history.  Social History Social History  Substance Use Topics  . Smoking status: Current Some Day Smoker    Packs/day: 1.00    Years: 15.00  . Smokeless tobacco: Never Used  . Alcohol use No     Allergies   Bextra [valdecoxib]   Review of Systems Review of Systems  Constitutional: Negative for chills and fever.  HENT: Positive for dental problem and facial swelling. Negative for sore throat.   Respiratory: Negative for shortness of breath, wheezing and stridor.   Musculoskeletal: Negative for neck pain and neck stiffness.     Physical Exam Updated Vital Signs BP (!) 139/91   Pulse 75   Temp 98.4 F (36.9 C) (Oral)   Resp 18   Ht 5\' 3"  (1.6 m)   Wt 136.1 kg (300 lb)   SpO2 100%   BMI 53.14 kg/m   Physical Exam  Constitutional: She is oriented to person, place, and time. She appears well-developed and well-nourished. No distress.  HENT:  Head: Normocephalic and atraumatic.  Right Ear: Tympanic membrane, external ear and ear canal normal.  Left Ear: Tympanic membrane, external ear and ear canal normal.  Nose: Sinus tenderness present.  Mouth/Throat: Oropharynx is clear and moist and mucous membranes are normal. No oral lesions. There is trismus in the jaw. Dental abscesses present.  Tenderness with  induration along the left nose and tracking across maxilla involving infraorbital .  Patient is unable to fully open her mouth secondary to pain.  She does have moderate left cheek induration with erythema.  No fluctuant pocket appreciated.  Her left upper premolars have decay at the bases with gingival edema and erythema above these teeth.  Sublingual space is soft.  Eyes: Conjunctivae are normal.  Neck: Normal range of motion. Neck supple.  Cardiovascular: Normal rate and normal heart sounds.   Pulmonary/Chest: Effort normal.  Musculoskeletal: Normal range of motion.  Lymphadenopathy:    She has no cervical adenopathy.  Neurological: She is alert and oriented to person, place, and time.  Skin: Skin is warm and dry. No erythema.  Psychiatric: She has a normal mood and affect.     ED  Treatments / Results  Labs (all labs ordered are listed, but only abnormal results are displayed) Labs Reviewed  CBC WITH DIFFERENTIAL/PLATELET - Abnormal; Notable for the following:       Result Value   WBC 11.0 (*)    MCV 100.2 (*)    Neutro Abs 7.8 (*)    All other components within normal limits  BASIC METABOLIC PANEL - Abnormal; Notable for the following:    Glucose, Bld 271 (*)    Calcium 8.8 (*)    All other components within normal limits  POC URINE PREG, ED    EKG  EKG Interpretation None       Radiology Ct Maxillofacial W Contrast  Result Date: 07/08/2016 CLINICAL DATA:  Facial edema and dental infection. Left-sided facial swelling beginning last night. EXAM: CT MAXILLOFACIAL WITH CONTRAST TECHNIQUE: Multidetector CT imaging of the maxillofacial structures was performed. Multiplanar CT image reconstructions were also generated. A small metallic BB was placed on the right temple in order to reliably differentiate right from left. COMPARISON:  None. FINDINGS: Osseous: No acute or aggressive finding. Diffuse tooth devitalization with periapical erosion around tooth 11 and the remaining  right lower molar. Orbits: Negative for inflammation. Symmetric prominence of lacrimal glands without superimposed mass. Sinuses: Minimal mucosal thickening along the left maxillary sinus floor, potentially odontogenic. No acute sinusitis Soft tissues: Asymmetric fat reticulation left of the piriform aperture and contiguous with the devitalized 11 tooth. Negative for abscess or soft tissue emphysema. Limited intracranial: Negative IMPRESSION: Fat reticulation associated with the devitalized eleventh tooth correlating with history of odontogenic infection. Negative for abscess. Electronically Signed   By: Marnee Spring M.D.   On: 07/08/2016 12:01    Procedures Procedures (including critical care time)  Medications Ordered in ED Medications  clindamycin (CLEOCIN) IVPB 600 mg (0 mg Intravenous Stopped 07/08/16 1059)  morphine 4 MG/ML injection 4 mg (4 mg Intravenous Given 07/08/16 1024)  ondansetron (ZOFRAN) injection 4 mg (4 mg Intravenous Given 07/08/16 1024)  iopamidol (ISOVUE-300) 61 % injection 75 mL (75 mLs Intravenous Contrast Given 07/08/16 1126)     Initial Impression / Assessment and Plan / ED Course  I have reviewed the triage vital signs and the nursing notes.  Pertinent labs & imaging results that were available during my care of the patient were reviewed by me and considered in my medical decision making (see chart for details).     Imaging and labs reviewed and discussed.  Pt prescribed clindamycin, hydrocodone, dental referrals given.  Advised close f/u with pcp for fasting glucose.  Return here in 24 hours if not responding to abx or for any worsened sx.   Final Clinical Impressions(s) / ED Diagnoses   Final diagnoses:  Dental infection  Hyperglycemia    New Prescriptions New Prescriptions   CLINDAMYCIN (CLEOCIN) 300 MG CAPSULE    Take 1 capsule (300 mg total) by mouth 4 (four) times daily.   HYDROCODONE-ACETAMINOPHEN (NORCO/VICODIN) 5-325 MG TABLET    Take 1 tablet by mouth  every 4 (four) hours as needed.     Burgess Amor, PA-C 07/08/16 1305    Eber Hong, MD 07/11/16 [phone]

## 2016-07-08 NOTE — Discharge Instructions (Signed)
Take your entire course of the antibiotic.  You may take the hydrocodone prescribed for pain relief.  This will make you drowsy - do not drive within 4 hours of taking this medication.  Establish care with a dentist as discussed.  Call your doctor for further testing regarding your elevated blood glucose level - it was 271 here.  Return here for a recheck tomorrow if your infection is worse in any way.

## 2016-07-08 NOTE — ED Notes (Signed)
Pt taken to ct 

## 2016-07-08 NOTE — ED Triage Notes (Signed)
Started having dental pain to left side yesterday. Moderate left side facial selling noted.

## 2016-09-06 DIAGNOSIS — F418 Other specified anxiety disorders: Secondary | ICD-10-CM | POA: Insufficient documentation

## 2016-09-06 DIAGNOSIS — Z6841 Body Mass Index (BMI) 40.0 and over, adult: Secondary | ICD-10-CM | POA: Insufficient documentation

## 2016-09-06 DIAGNOSIS — N912 Amenorrhea, unspecified: Secondary | ICD-10-CM | POA: Insufficient documentation

## 2016-12-20 ENCOUNTER — Encounter (HOSPITAL_COMMUNITY): Payer: Self-pay | Admitting: Emergency Medicine

## 2016-12-20 ENCOUNTER — Other Ambulatory Visit: Payer: Self-pay

## 2016-12-20 ENCOUNTER — Emergency Department (HOSPITAL_COMMUNITY): Payer: Medicaid Other

## 2016-12-20 ENCOUNTER — Emergency Department (HOSPITAL_COMMUNITY)
Admission: EM | Admit: 2016-12-20 | Discharge: 2016-12-20 | Disposition: A | Discharge status: Home / Self Care | Payer: Medicaid Other | Attending: Emergency Medicine | Admitting: Emergency Medicine

## 2016-12-20 DIAGNOSIS — I10 Essential (primary) hypertension: Secondary | ICD-10-CM | POA: Insufficient documentation

## 2016-12-20 DIAGNOSIS — S92355A Nondisplaced fracture of fifth metatarsal bone, left foot, initial encounter for closed fracture: Secondary | ICD-10-CM | POA: Insufficient documentation

## 2016-12-20 DIAGNOSIS — Z79899 Other long term (current) drug therapy: Secondary | ICD-10-CM | POA: Insufficient documentation

## 2016-12-20 DIAGNOSIS — Y9301 Activity, walking, marching and hiking: Secondary | ICD-10-CM | POA: Insufficient documentation

## 2016-12-20 DIAGNOSIS — S300XXA Contusion of lower back and pelvis, initial encounter: Secondary | ICD-10-CM | POA: Diagnosis present

## 2016-12-20 DIAGNOSIS — F17210 Nicotine dependence, cigarettes, uncomplicated: Secondary | ICD-10-CM | POA: Diagnosis not present

## 2016-12-20 DIAGNOSIS — Y999 Unspecified external cause status: Secondary | ICD-10-CM | POA: Insufficient documentation

## 2016-12-20 DIAGNOSIS — Y929 Unspecified place or not applicable: Secondary | ICD-10-CM | POA: Insufficient documentation

## 2016-12-20 DIAGNOSIS — W109XXA Fall (on) (from) unspecified stairs and steps, initial encounter: Secondary | ICD-10-CM | POA: Insufficient documentation

## 2016-12-20 DIAGNOSIS — W19XXXA Unspecified fall, initial encounter: Secondary | ICD-10-CM

## 2016-12-20 LAB — POC URINE PREG, ED: Preg Test, Ur: NEGATIVE

## 2016-12-20 MED ORDER — HYDROCODONE-ACETAMINOPHEN 5-325 MG PO TABS: 1.0000 | ORAL_TABLET | ORAL | 0 refills | Status: DC | PRN

## 2016-12-20 MED ORDER — KETOROLAC TROMETHAMINE 60 MG/2ML IM SOLN
60.0000 mg | Freq: Once | INTRAMUSCULAR | Status: AC
  Administered 2016-12-20: 60 mg via INTRAMUSCULAR
  Filled 2016-12-20: qty 2

## 2016-12-20 NOTE — ED Provider Notes (Signed)
[facility] EMERGENCY DEPARTMENT Provider Note   CSN: [id number] Arrival date & time: 12/20/16  1802     History   Chief Complaint Chief Complaint  Patient presents with  . Fall    HPI Isabella Walker is a 36 y.o. female with a past medical history of hypertension, depression, anxiety presenting with pain in her lower back and left ankle since falling this afternoon.  She was walking down her outdoor wooden steps which were wet from rain when her feet slipped out from under her.  She landed directly on her buttocks and has had pain since the event.  She has been able to weight-bear but with discomfort.  She denies weakness or numbness in her legs and has had no urinary or fecal incontinence or retention since the event.  She denies hitting her head, denies neck pain, denies LOC.  She does have pain in her left lateral ankle which radiates into her foot.  She has had no treatments prior to arrival.  The history is provided by the patient.    Past Medical History:  Diagnosis Date  . Anxiety   . Depression complicating pregnancy, postpartum 2012  . GERD (gastroesophageal reflux disease)   . Headache(784.0)    hx of migraines-last one few months ago.  Marland Kitchen Hypertension     Patient Active Problem List   Diagnosis Date Noted  . Muscle weakness (generalized) 11/05/2011  . SHOULDER PAIN 10/27/2007  . CERVICAL SPASM 10/27/2007    Past Surgical History:  Procedure Laterality Date  . INSERTION OF MESH N/A 05/25/2012   Procedure: INSERTION OF MESH;  Surgeon: Dalia Heading, MD;  Location: [facility];  Service: General;  Laterality: N/A;  . ORIF KNEE DISLOCATION Right   . VENTRAL HERNIA REPAIR N/A 05/25/2012   Procedure: LAPAROSCOPIC VENTRAL HERNIA;  Surgeon: Dalia Heading, MD;  Location: [facility];  Service: General;  Laterality: N/A;    OB History    No data available       Home Medications    Prior to Admission medications   Medication Sig Start Date End Date Taking? Authorizing  Provider  busPIRone (BUSPAR) 10 MG tablet Take 10 mg by mouth 3 (three) times daily.    [provider]  butalbital-acetaminophen-caffeine (FIORICET, ESGIC) 50-325-40 MG per tablet Take 1-2 tablets by mouth every 6 (six) hours as needed for headache.    [provider]  butalbital-acetaminophen-caffeine (FIORICET/CODEINE) 50-325-40-30 MG capsule 1 or 2 po q6h prn headache Patient not taking: Reported on 07/08/2016 05/25/16   Ivery Quale, PA-C  celecoxib (CELEBREX) 100 MG capsule Take 100 mg by mouth 2 (two) times daily.    [provider]  cholecalciferol (VITAMIN D) 1000 units tablet Take 1,000 Units by mouth daily.    [provider]  Cholecalciferol (VITAMIN D3) 2000 units TABS Take 1 tablet by mouth daily.    [provider]  clindamycin (CLEOCIN) 300 MG capsule Take 1 capsule (300 mg total) by mouth 4 (four) times daily. 07/08/16   Burgess Amor, PA-C  clonazePAM (KLONOPIN) 0.5 MG tablet Take 0.5 mg by mouth 3 (three) times daily as needed for anxiety.    [provider]  gabapentin (NEURONTIN) 300 MG capsule Take 300 mg by mouth 2 (two) times daily. 1 capsule 2 times daily and 2 capsules at bedtime    [provider]  HYDROcodone-acetaminophen (NORCO/VICODIN) 5-325 MG tablet Take 1 tablet by mouth every 4 (four) hours as needed. 12/20/16   Burgess Amor,  PA-C  hydrOXYzine (ATARAX/VISTARIL) 25 MG tablet Take 25 mg by mouth every 8 (eight) hours as needed.    [provider]  levothyroxine (SYNTHROID, LEVOTHROID) 125 MCG tablet Take 125 mcg by mouth daily before breakfast.    [provider]  loratadine (CLARITIN) 10 MG tablet Take 10 mg by mouth every morning.    [provider]  mirtazapine (REMERON) 45 MG tablet Take 45 mg by mouth at bedtime.    [provider]  naproxen sodium (ALEVE) 220 MG tablet Take 440 mg by mouth daily as needed (FOR HEADACHE PAIN).    [provider]  omeprazole  (PRILOSEC) 20 MG capsule Take 40 mg by mouth every morning.     [provider]  ondansetron (ZOFRAN) 4 MG tablet Take 1 tablet (4 mg total) by mouth every 6 (six) hours. Patient not taking: Reported on 07/08/2016 05/25/16   Ivery Quale, PA-C  topiramate (TOPAMAX) 25 MG tablet Take 50 mg by mouth 2 (two) times daily.     [provider]  traMADol (ULTRAM) 50 MG tablet Take 50 mg by mouth 3 (three) times daily.    [provider]  triamterene-hydrochlorothiazide (MAXZIDE-25) 37.5-25 MG per tablet Take 1 tablet by mouth every morning.    [provider]  Vilazodone HCl (VIIBRYD) 40 MG TABS Take 40 mg by mouth daily after supper.    [provider]    Family History History reviewed. No pertinent family history.  Social History Social History   Tobacco Use  . Smoking status: Current Some Day Smoker    Packs/day: 1.00    Years: 15.00    Pack years: 15.00  . Smokeless tobacco: Never Used  Substance Use Topics  . Alcohol use: No  . Drug use: No     Allergies   Bextra [valdecoxib]   Review of Systems Review of Systems  Constitutional: Negative for fever.  Respiratory: Negative for shortness of breath.   Cardiovascular: Negative for chest pain and leg swelling.  Gastrointestinal: Negative for abdominal distention, abdominal pain and constipation.  Genitourinary: Negative for difficulty urinating, dysuria, flank pain, frequency and urgency.  Musculoskeletal: Positive for arthralgias and back pain. Negative for gait problem, joint swelling and myalgias.  Skin: Negative for rash.  Neurological: Negative for weakness and numbness.     Physical Exam Updated Vital Signs BP (!) 107/53 (BP Location: Left Arm)   Pulse 75   Temp 97.7 F (36.5 C) (Oral)   Resp 16   Ht 5\' 3"  (1.6 m)   Wt 131.5 kg (290 lb)   SpO2 100%   BMI 51.37 kg/m   Physical Exam  Constitutional: She appears well-developed and well-nourished.  HENT:  Head:  Normocephalic.  Eyes: Conjunctivae are normal.  Neck: Normal range of motion. Neck supple.  Cardiovascular: Normal rate and intact distal pulses.  Pedal pulses normal.  Pulmonary/Chest: Effort normal.  Musculoskeletal: She exhibits no edema.       Left ankle: She exhibits decreased range of motion and swelling. She exhibits no ecchymosis and normal pulse. Tenderness. Lateral malleolus and head of 5th metatarsal tenderness found. No proximal fibula tenderness found. Achilles tendon normal.       Thoracic back: She exhibits bony tenderness. She exhibits no swelling, no edema and no spasm.       Lumbar back: She exhibits bony tenderness. She exhibits no swelling, no edema, no deformity and no spasm.  Neurological: She is alert. She has normal strength. She displays no atrophy  and no tremor. No sensory deficit. Gait normal.  Reflex Scores:      Patellar reflexes are 2+ on the right side and 2+ on the left side.      Achilles reflexes are 2+ on the right side and 2+ on the left side. No strength deficit noted in hip and knee flexor and extensor muscle groups.  Ankle flexion and extension intact.  Skin: Skin is warm and dry.  Psychiatric: She has a normal mood and affect.  Nursing note and vitals reviewed.    ED Treatments / Results  Labs (all labs ordered are listed, but only abnormal results are displayed) Labs Reviewed  POC URINE PREG, ED    EKG  EKG Interpretation None       Radiology Dg Thoracic Spine 2 View  Result Date: 12/20/2016 CLINICAL DATA:  Slip and fall down 4 steps with thoracolumbar back pain radiating down both legs. EXAM: THORACIC SPINE 2 VIEWS COMPARISON:  None. FINDINGS: The alignment is maintained. Vertebral body heights are maintained. No evidence of acute fracture. Posterior elements appear intact. Multilevel endplate spurring throughout the thoracic spine, most prominent in the mid distal portion. There is no paravertebral soft tissue abnormality. IMPRESSION:  No fracture or acute osseous abnormality of the thoracic spine. Electronically Signed   By: Rubye OaksMelanie  Ehinger M.D.   On: 12/20/2016 20:00   Dg Lumbar Spine Complete  Result Date: 12/20/2016 CLINICAL DATA:  Slip and fall down 4 steps with thoracolumbar back pain radiating down both legs. EXAM: LUMBAR SPINE - COMPLETE 4+ VIEW COMPARISON:  Radiographs 11/18/2012 FINDINGS: The alignment is maintained. Vertebral body heights are normal. There is no listhesis. The posterior elements are intact. Disc spaces are preserved. Mild endplate spurring in the lower thoracic spine. No fracture. Sacroiliac joints are symmetric and normal. IUD noted in the pelvis. IMPRESSION: No fracture or acute osseous abnormality of the lumbar spine. Electronically Signed   By: Rubye OaksMelanie  Ehinger M.D.   On: 12/20/2016 19:56   Dg Ankle Complete Left  Result Date: 12/20/2016 CLINICAL DATA:  Slip and fall down 4 steps with left ankle pain. EXAM: LEFT ANKLE COMPLETE - 3+ VIEW COMPARISON:  None. FINDINGS: Possible nondisplaced base of fifth metatarsal fracture, seen only on the oblique view. No additional fracture of the ankle. The alignment and ankle mortise are preserved. There is no evidence of arthropathy or other focal bone abnormality. Soft tissues are unremarkable. IMPRESSION: Possible nondisplaced fracture at the base of fifth metatarsal, seen only on a single view. Recommend correlation with point tenderness. Foot radiographs could be considered for further evaluation. Electronically Signed   By: Rubye OaksMelanie  Ehinger M.D.   On: 12/20/2016 19:58    Procedures Procedures (including critical care time)  Medications Ordered in ED Medications  ketorolac (TORADOL) injection 60 mg (60 mg Intramuscular Given 12/20/16 1905)     Initial Impression / Assessment and Plan / ED Course  I have reviewed the triage vital signs and the nursing notes.  Pertinent labs & imaging results that were available during my care of the patient were  reviewed by me and considered in my medical decision making (see chart for details).     Patient is point tender at her head of the left fifth metatarsal, favoring probable small nondisplaced fracture at the site.  This does correlate with the ankle film.  She was placed in a Cam walker, discussed rice treatment, follow-up with orthopedics, referral given.  Final Clinical Impressions(s) / ED Diagnoses   Final diagnoses:  Fall, initial encounter  Lumbar contusion, initial encounter  Closed nondisplaced fracture of fifth metatarsal bone of left foot, initial encounter    ED Discharge Orders        Ordered    HYDROcodone-acetaminophen (NORCO/VICODIN) 5-325 MG tablet  Every 4 hours PRN     12/20/16 2038       Burgess Amordol, Temica Righetti, PA-C 12/20/16 2254    Burgess AmorIdol, Mckoy Bhakta, PA-C 12/20/16 2254    Samuel JesterMcManus, Kathleen, DO 12/23/16 1909

## 2016-12-20 NOTE — Discharge Instructions (Signed)
Wear the boot to protect your injury at all times when weight bearing.  Ice and elevation will help with pain as well.  Your back xrays show no sign of injury.  You may take the hydrocodone prescribed for pain relief.  This will make you drowsy - do not drive within 4 hours of taking this medication.

## 2016-12-20 NOTE — ED Triage Notes (Signed)
PT states she slipped on some outdoor wooden steps today and fell down 4 steps and has had lower back pain since fall. PT able to bear weight to legs and got herself out of the car into a wheelchair.

## 2016-12-21 NOTE — ED Notes (Signed)
Pt went to have Vicodin prescription filled but pharmacy reports pt was prescribed by PCP, Nucynta ER 50mg , 60/month, BID. Per PA, Vicodin prescription not to be filled if pt still taking Nucynta. Pharmacist verbalized understanding and reported would not fill Vicodin prescription.

## 2016-12-23 ENCOUNTER — Telehealth: Payer: Self-pay | Admitting: Orthopaedic Surgery

## 2016-12-23 NOTE — Telephone Encounter (Signed)
Call from patient inquiring about scheduling for appointment for injury "fractured foot/ankle" per [facility] visit; although another orthopaedist was on call, offered appointment. P[facility] referral [facility], per insurance. Patient contacting her primary care doctor's office.  We've also faxed note to referral department.

## 2017-02-06 ENCOUNTER — Ambulatory Visit: Payer: Medicaid Other | Admitting: Orthopaedic Surgery

## 2017-02-10 ENCOUNTER — Ambulatory Visit (INDEPENDENT_AMBULATORY_CARE_PROVIDER_SITE_OTHER): Payer: Medicaid Other

## 2017-02-10 ENCOUNTER — Encounter: Payer: Self-pay | Admitting: Orthopedic Surgery

## 2017-02-10 ENCOUNTER — Ambulatory Visit: Payer: Medicaid Other | Admitting: Orthopedic Surgery

## 2017-02-10 VITALS — BP 145/74 | HR 67 | Ht 63.0 in | Wt 290.0 lb

## 2017-02-10 DIAGNOSIS — M79672 Pain in left foot: Secondary | ICD-10-CM | POA: Diagnosis not present

## 2017-02-10 DIAGNOSIS — S92355D Nondisplaced fracture of fifth metatarsal bone, left foot, subsequent encounter for fracture with routine healing: Secondary | ICD-10-CM | POA: Diagnosis not present

## 2017-02-10 NOTE — Progress Notes (Signed)
Progress Note, new problem   Patient ID: Isabella Walker, female   DOB: 04-Feb-1980, 37 y.o.   MRN: [id number]  No chief complaint on file.   37 year old female presents for evaluation of painful left foot  She fell on December 14 she wore a boot for 3 weeks she comes in complaining of continued dull constant pain at the proximal aspect of the fifth metatarsal, mild.  Present for 50+ days.       Review of Systems  Constitutional: Negative for chills and fever.  Musculoskeletal: Positive for joint pain.  Neurological: Negative for tingling.   No outpatient medications have been marked as taking for the 02/10/17 encounter (Appointment) with Vickki HearingHarrison, Kyshawn Teal E, MD.    Past Medical History:  Diagnosis Date  . Anxiety   . Depression complicating pregnancy, postpartum 2012  . GERD (gastroesophageal reflux disease)   . Headache(784.0)    hx of migraines-last one few months ago.  Marland Kitchen. Hypertension      Allergies  Allergen Reactions  . Bextra [Valdecoxib] Other (See Comments)    SEVERE NOSE BLEEDS    BP (!) 145/74   Pulse 67   Ht 5\' 3"  (1.6 m)   Wt 290 lb (131.5 kg)   BMI 51.37 kg/m     Physical Exam  Constitutional: She is oriented to person, place, and time. She appears well-developed and well-nourished.  Neurological: She is alert and oriented to person, place, and time. Gait abnormal.  Mild limp  Psychiatric: She has a normal mood and affect. Judgment normal.  Vitals reviewed.   Ortho Exam Left foot tenderness at the proximal portion of the fifth metatarsal no swelling ankle range of motion and subtalar joint normal ankle stability normal strength normal skin warm dry intact no rash or lesion pulse and perfusion normal no edema temperature normal sensation normal  Medical decision-making  Imaging:  Original image showed a fracture of the fifth metatarsal at the proximal aspect  Today's x-rays (see my dictated report) shows the  fracture has  healed  Plan   Encounter Diagnoses  Name Primary?  . Pain of left foot Yes  . Closed nondisplaced fracture of fifth metatarsal bone of left foot with routine healing, subsequent encounter    [facility] orthotics    Released , no follow up needed    Fuller CanadaStanley Kentley Blyden, MD 02/10/2017 10:47 AM

## 2017-04-01 IMAGING — MR MR KNEE*R* W/O CM
4 of 6 series · 13 of 40 positions shown · non-contrast
Comparison: Radiographs 08/03/2014.

CLINICAL DATA: 33-year-old with progressive medial knee pain for
several months and limited weight-bearing. History of surgery for
patellar fracture in 2993. No recent injury. Initial encounter.

EXAM:
MRI OF THE RIGHT KNEE WITHOUT CONTRAST
TECHNIQUE: Multiplanar, multisequence MR imaging of the knee was performed. No
intravenous contrast was administered.

[Series 4: T1 · coronal · 3.0mm · 0.19mm/px · 4 of 28 slices shown]
[im 1/28]
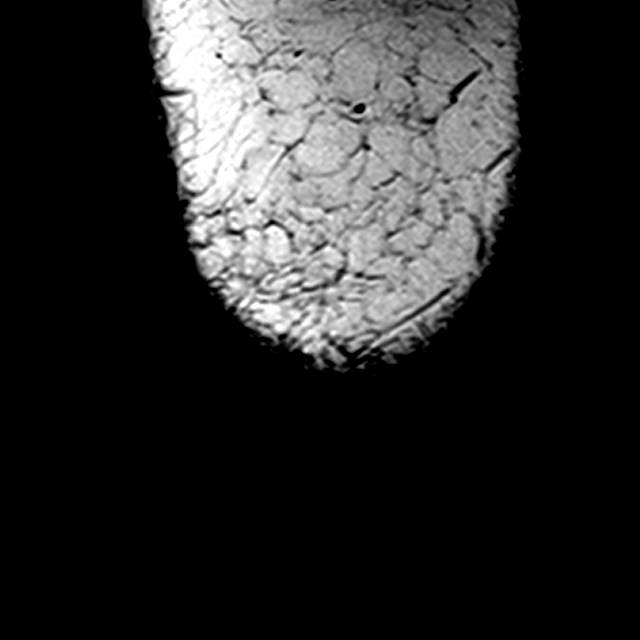
[im 5/28]
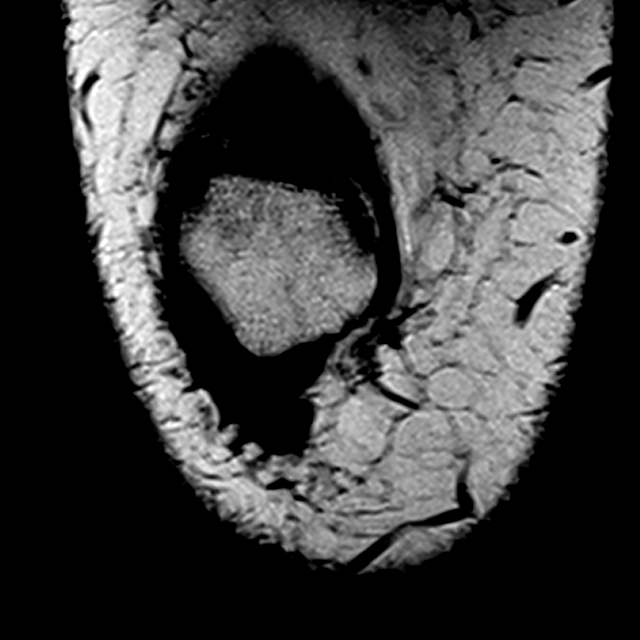
[im 14/28]
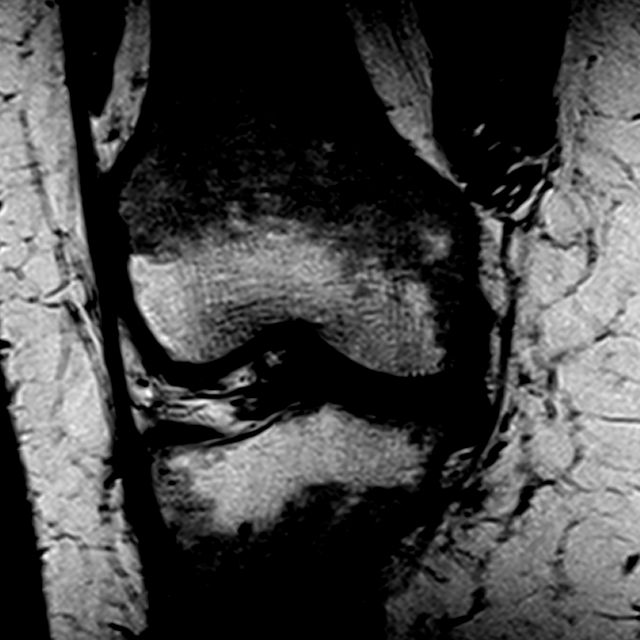
[im 23/28]
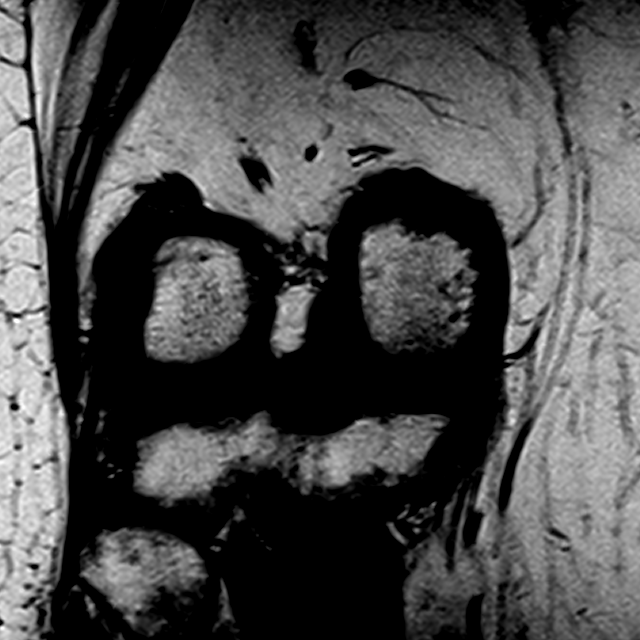

[Series 5: pdfs sag · sagittal · 3.0mm · 0.23mm/px · 3 of 29 slices shown]
[im 5/29]
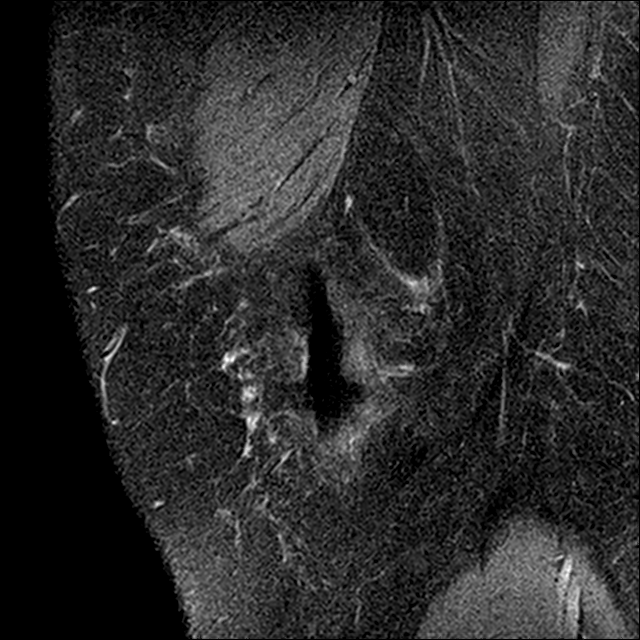
[im 15/29]
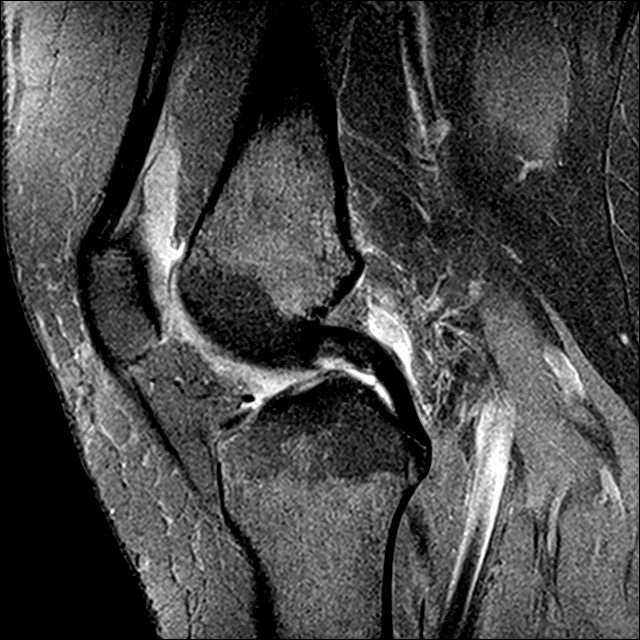
[im 24/29]
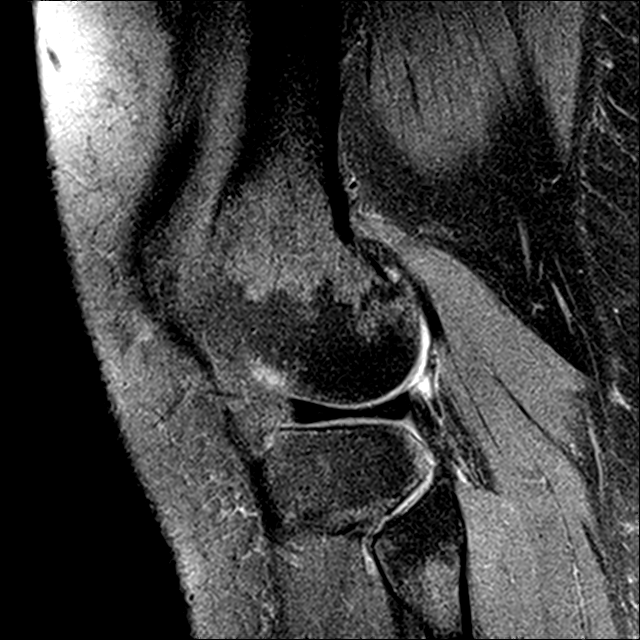

[Series 6: t2fs cor · coronal · 3.0mm · 0.20mm/px · 3 of 28 slices shown]
[im 5/28]
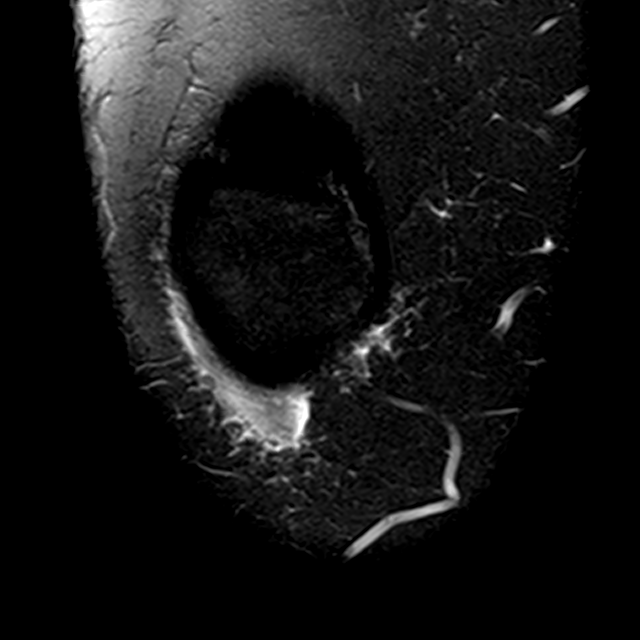
[im 14/28]
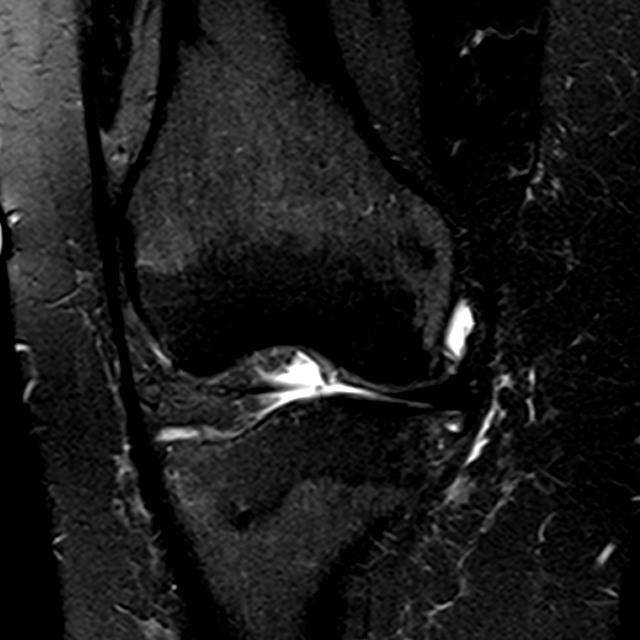
[im 23/28]
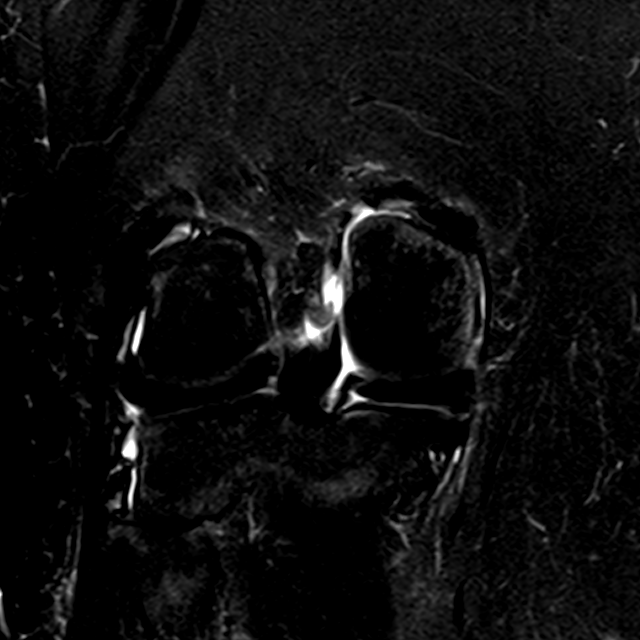

[Series 7: pdfs cor · coronal · 3.0mm · 0.19mm/px · 3 of 28 slices shown]
[im 5/28]
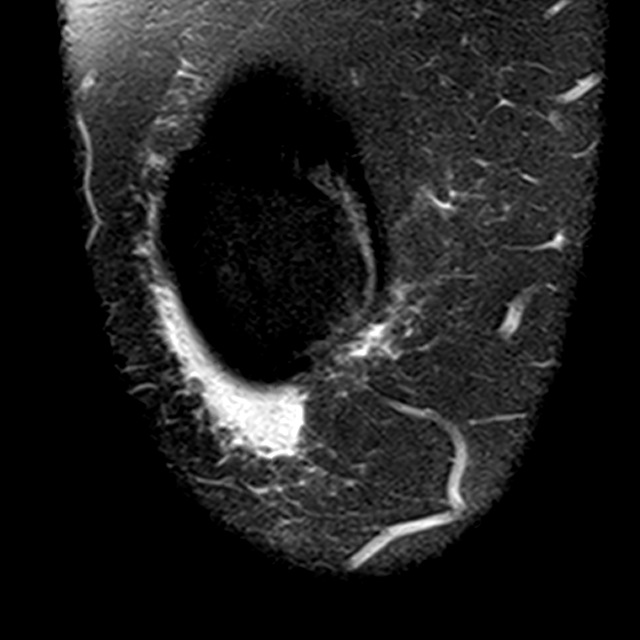
[im 14/28]
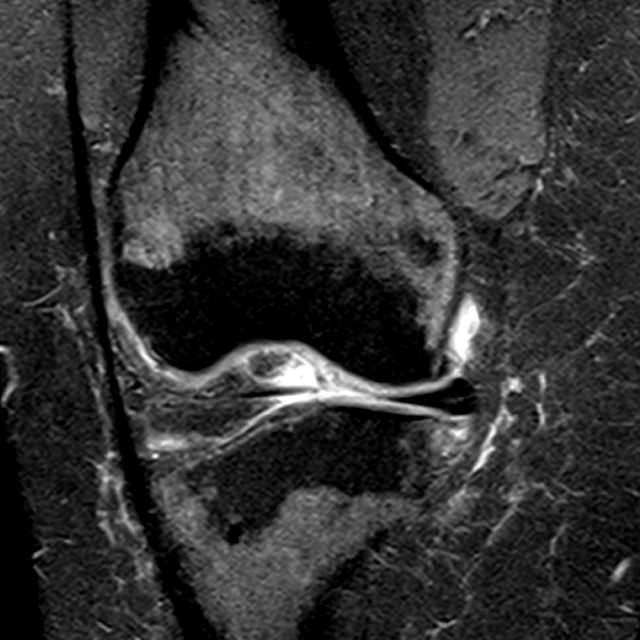
[im 23/28]
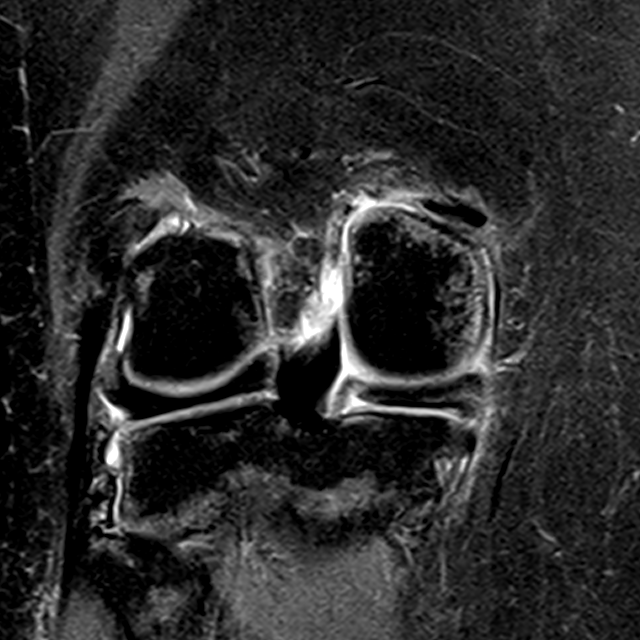

[13 of 40 positions shown; findings below may reference images not displayed]

FINDINGS: MENISCI

Medial meniscus: Meniscal degeneration with radial tearing at the
meniscal root and mild extrusion of the meniscus from the joint.
There is incomplete meniscal detachment and no centrally displaced
meniscal fragment.

Lateral meniscus:  Discoid configuration without tear.

LIGAMENTS

Cruciates:  Intact.

Collaterals: Intact. The medial collateral ligament is mildly
degenerated and buckled. The fibular collateral ligament is also
thickened with mild T2 hyperintensity proximally.

CARTILAGE

Patellofemoral: Mild chondral thinning and surface irregularity
without full-thickness defect.

Medial:  Mild chondral thinning and surface irregularity.

Lateral:  Preserved.

OTHER

Joint: Small joint effusion. No evidence of intra-articular loose
body.

Popliteal Fossa:  Unremarkable. No significant Baker's cyst.

Extensor Mechanism: Intact. There is mild prepatellar subcutaneous
edema.

Bones: No significant extra-articular osseous findings. There is
possible spurring of the lateral femoral condyle near the origin of
the fibular collateral ligament which may be posttraumatic in
origin. Residual red marrow present within the distal femur and
proximal tibia. The patella shows no evidence of previous injury.
IMPRESSION: 1. Medial meniscal degeneration radial tearing at the meniscal root,
but incomplete meniscal detachment.
2. Suspected old injury of the fibular collateral ligament
proximally. No acute ligamentous findings.
3. Mild patellofemoral and medial compartment degenerative
chondrosis. No acute osseous findings

## 2017-06-05 ENCOUNTER — Ambulatory Visit (INDEPENDENT_AMBULATORY_CARE_PROVIDER_SITE_OTHER): Payer: Medicaid Other | Admitting: Otolaryngology

## 2017-06-05 DIAGNOSIS — H906 Mixed conductive and sensorineural hearing loss, bilateral: Secondary | ICD-10-CM

## 2017-06-05 DIAGNOSIS — H6983 Other specified disorders of Eustachian tube, bilateral: Secondary | ICD-10-CM

## 2017-06-05 DIAGNOSIS — H9202 Otalgia, left ear: Secondary | ICD-10-CM

## 2017-07-22 ENCOUNTER — Other Ambulatory Visit (HOSPITAL_COMMUNITY): Payer: Self-pay | Admitting: Neurology

## 2017-07-22 DIAGNOSIS — M5416 Radiculopathy, lumbar region: Secondary | ICD-10-CM

## 2017-07-29 ENCOUNTER — Ambulatory Visit (HOSPITAL_COMMUNITY): Payer: Medicaid Other

## 2017-07-29 ENCOUNTER — Encounter (HOSPITAL_COMMUNITY): Payer: Self-pay

## 2017-09-04 ENCOUNTER — Ambulatory Visit (INDEPENDENT_AMBULATORY_CARE_PROVIDER_SITE_OTHER): Payer: Medicaid Other | Admitting: Otolaryngology

## 2017-09-28 DIAGNOSIS — Z5321 Procedure and treatment not carried out due to patient leaving prior to being seen by health care provider: Secondary | ICD-10-CM | POA: Insufficient documentation

## 2017-09-28 DIAGNOSIS — R251 Tremor, unspecified: Secondary | ICD-10-CM | POA: Diagnosis present

## 2017-09-29 ENCOUNTER — Emergency Department (HOSPITAL_COMMUNITY)
Admission: EM | Admit: 2017-09-29 | Discharge: 2017-09-29 | Disposition: A | Discharge status: Home / Self Care | Payer: Medicaid Other | Attending: Emergency Medicine | Admitting: Emergency Medicine

## 2017-09-29 ENCOUNTER — Ambulatory Visit (INDEPENDENT_AMBULATORY_CARE_PROVIDER_SITE_OTHER): Payer: Medicaid Other | Admitting: Otolaryngology

## 2017-09-29 ENCOUNTER — Other Ambulatory Visit: Payer: Self-pay

## 2017-09-29 ENCOUNTER — Encounter (HOSPITAL_COMMUNITY): Payer: Self-pay | Admitting: Emergency Medicine

## 2017-09-29 NOTE — ED Triage Notes (Signed)
Pt states she was in shower when her R leg started to shake uncontrollably.

## 2017-09-29 NOTE — ED Notes (Signed)
Patient left before being assessed by provider, due to wait time.

## 2017-11-26 ENCOUNTER — Other Ambulatory Visit: Payer: Self-pay | Admitting: Neurology

## 2017-11-26 DIAGNOSIS — R269 Unspecified abnormalities of gait and mobility: Secondary | ICD-10-CM

## 2017-11-26 DIAGNOSIS — R519 Headache, unspecified: Secondary | ICD-10-CM

## 2017-11-26 DIAGNOSIS — R51 Headache: Principal | ICD-10-CM

## 2017-12-03 ENCOUNTER — Ambulatory Visit (HOSPITAL_COMMUNITY)
Admission: RE | Admit: 2017-12-03 | Discharge: 2017-12-03 | Disposition: A | Discharge status: Home / Self Care | Payer: Medicaid Other | Source: Ambulatory Visit | Attending: Neurology | Admitting: Neurology

## 2017-12-03 DIAGNOSIS — R51 Headache: Secondary | ICD-10-CM | POA: Diagnosis not present

## 2017-12-03 DIAGNOSIS — R519 Headache, unspecified: Secondary | ICD-10-CM

## 2017-12-03 DIAGNOSIS — R269 Unspecified abnormalities of gait and mobility: Secondary | ICD-10-CM | POA: Diagnosis not present

## 2018-01-09 ENCOUNTER — Other Ambulatory Visit: Payer: Self-pay

## 2018-01-09 ENCOUNTER — Encounter (HOSPITAL_COMMUNITY): Payer: Self-pay

## 2018-01-09 ENCOUNTER — Emergency Department (HOSPITAL_COMMUNITY)
Admission: EM | Admit: 2018-01-09 | Discharge: 2018-01-09 | Disposition: A | Discharge status: Home / Self Care | Payer: Medicaid Other | Attending: Emergency Medicine | Admitting: Emergency Medicine

## 2018-01-09 DIAGNOSIS — Z87891 Personal history of nicotine dependence: Secondary | ICD-10-CM | POA: Diagnosis not present

## 2018-01-09 DIAGNOSIS — I10 Essential (primary) hypertension: Secondary | ICD-10-CM | POA: Diagnosis not present

## 2018-01-09 DIAGNOSIS — L0231 Cutaneous abscess of buttock: Secondary | ICD-10-CM | POA: Insufficient documentation

## 2018-01-09 DIAGNOSIS — Z79899 Other long term (current) drug therapy: Secondary | ICD-10-CM | POA: Insufficient documentation

## 2018-01-09 MED ORDER — OXYCODONE-ACETAMINOPHEN 5-325 MG PO TABS
1.0000 | ORAL_TABLET | Freq: Once | ORAL | Status: AC
  Administered 2018-01-09: 1 via ORAL
  Filled 2018-01-09: qty 1

## 2018-01-09 MED ORDER — SULFAMETHOXAZOLE-TRIMETHOPRIM 800-160 MG PO TABS
1.0000 | ORAL_TABLET | Freq: Once | ORAL | Status: AC
  Administered 2018-01-09: 1 via ORAL
  Filled 2018-01-09: qty 1

## 2018-01-09 MED ORDER — POVIDONE-IODINE 10 % EX SOLN
CUTANEOUS | Status: DC | PRN
  Administered 2018-01-09: 1 via TOPICAL
  Filled 2018-01-09: qty 15

## 2018-01-09 MED ORDER — OXYCODONE-ACETAMINOPHEN 5-325 MG PO TABS: 2.0000 | ORAL_TABLET | ORAL | 0 refills | Status: DC | PRN

## 2018-01-09 MED ORDER — LIDOCAINE HCL (PF) 2 % IJ SOLN
5.0000 mL | Freq: Once | INTRAMUSCULAR | Status: AC
  Administered 2018-01-09: 5 mL via INTRADERMAL

## 2018-01-09 MED ORDER — LIDOCAINE HCL (PF) 2 % IJ SOLN
INTRAMUSCULAR | Status: AC
  Filled 2018-01-09: qty 10

## 2018-01-09 MED ORDER — SULFAMETHOXAZOLE-TRIMETHOPRIM 800-160 MG PO TABS: 1.0000 | ORAL_TABLET | Freq: Two times a day (BID) | ORAL | 0 refills | Status: AC

## 2018-01-09 MED ORDER — LIDOCAINE HCL (PF) 1 % IJ SOLN
INTRAMUSCULAR | Status: AC
  Filled 2018-01-09: qty 4

## 2018-01-09 NOTE — Discharge Instructions (Addendum)
Frequent warm wet compresses or soaks to your buttock to 3 times a day.  Packing will need to be removed in 2 days.  Take the antibiotic as directed until its finished.  Follow-up with your primary doctor or return here if not improving.

## 2018-01-09 NOTE — ED Triage Notes (Signed)
Pt reports abscess to buttocks and 2 under r arm x 2 or 3 days.   Reports abscess on buttocks draining dark bloody drainage.

## 2018-01-09 NOTE — ED Provider Notes (Signed)
[facility] EMERGENCY DEPARTMENT Provider Note   CSN: [id number] Arrival date & time: 01/09/18  1948     History   Chief Complaint Chief Complaint  Patient presents with  . Abscess    HPI Isabella Walker is a 38 y.o. female.  HPI   Isabella Walker is a 38 y.o. female who presents to the Emergency Department complaining of pain, redness, swelling and drainage localized to the right buttock.  She states she has a "boil" that she noticed several days ago that has progressively gotten worse.  She squeezed it and it drained some pus and blood, but the area has been painful ever since.  She also complains of two smaller, painful nodules to the right axilla.  She has history of same.  She denies fever, chills, vomiting. No hx of DM.     Past Medical History:  Diagnosis Date  . Anxiety   . Depression complicating pregnancy, postpartum 2012  . GERD (gastroesophageal reflux disease)   . Headache(784.0)    hx of migraines-last one few months ago.  Marland Kitchen. Hypertension     Patient Active Problem List   Diagnosis Date Noted  . Muscle weakness (generalized) 11/05/2011  . SHOULDER PAIN 10/27/2007  . CERVICAL SPASM 10/27/2007    Past Surgical History:  Procedure Laterality Date  . INSERTION OF MESH N/A 05/25/2012   Procedure: INSERTION OF MESH;  Surgeon: Dalia HeadingMark A Jenkins, MD;  Location: [facility];  Service: General;  Laterality: N/A;  . ORIF KNEE DISLOCATION Right   . VENTRAL HERNIA REPAIR N/A 05/25/2012   Procedure: LAPAROSCOPIC VENTRAL HERNIA;  Surgeon: Dalia HeadingMark A Jenkins, MD;  Location: [facility];  Service: General;  Laterality: N/A;     OB History   No obstetric history on file.      Home Medications    Prior to Admission medications   Medication Sig Start Date End Date Taking? Authorizing Provider  buPROPion (WELLBUTRIN SR) 150 MG 12 hr tablet Take 150 mg by mouth 2 (two) times daily. 01/22/17   [provider]  busPIRone (BUSPAR) 10 MG tablet Take 10 mg by mouth 3 (three) times  daily.    [provider]  butalbital-acetaminophen-caffeine (FIORICET, ESGIC) 50-325-40 MG per tablet Take 1-2 tablets by mouth every 6 (six) hours as needed for headache.    [provider]  butalbital-acetaminophen-caffeine (FIORICET/CODEINE) 50-325-40-30 MG capsule 1 or 2 po q6h prn headache Patient not taking: Reported on 07/08/2016 05/25/16   Ivery QualeBryant, Hobson, PA-C  celecoxib (CELEBREX) 100 MG capsule Take 100 mg by mouth 2 (two) times daily.    [provider]  cholecalciferol (VITAMIN D) 1000 units tablet Take 1,000 Units by mouth daily.    [provider]  clonazePAM (KLONOPIN) 0.5 MG tablet Take 0.5 mg by mouth 3 (three) times daily as needed for anxiety.    [provider]  COD LIVER OIL PO Take by mouth.    [provider]  gabapentin (NEURONTIN) 300 MG capsule Take 300 mg by mouth 2 (two) times daily. 1 capsule 2 times daily and 2 capsules at bedtime    [provider]  HYDROcodone-acetaminophen (NORCO/VICODIN) 5-325 MG tablet Take 1 tablet by mouth every 4 (four) hours as needed. Patient not taking: Reported on 02/10/2017 12/20/16   Burgess AmorIdol, Julie, PA-C  hydrOXYzine (ATARAX/VISTARIL) 25 MG tablet Take 25 mg by mouth every 8 (eight) hours as needed.    [provider]  levonorgestrel (MIRENA, 52 MG,) 20 MCG/24HR IUD  [provider]  levothyroxine (SYNTHROID, LEVOTHROID) 125 MCG tablet Take 125 mcg by mouth daily before breakfast.    [provider]  loratadine (CLARITIN) 10 MG tablet Take 10 mg by mouth every morning.    [provider]  LYRICA 150 MG capsule  02/03/17   [provider]  metFORMIN (GLUCOPHAGE-XR) 500 MG 24 hr tablet  01/14/17   [provider]  mirtazapine (REMERON) 45 MG tablet Take 45 mg by mouth at bedtime.    [provider]  Multiple Vitamin (MULTIVITAMIN) capsule Take 1 capsule by mouth daily.    [provider]  naproxen sodium (ALEVE)  220 MG tablet Take 440 mg by mouth daily as needed (FOR HEADACHE PAIN).    [provider]  NUCYNTA ER 100 MG 12 hr tablet  01/13/17   [provider]  omeprazole (PRILOSEC) 20 MG capsule Take 40 mg by mouth every morning.     [provider]  phentermine 37.5 MG capsule Take 37.5 mg by mouth every morning.    [provider]  sertraline (ZOLOFT) 100 MG tablet take 1/2 tablet once daily for 1 WEEK,THEN 1 TAB DAILY. 01/22/17   [provider]  tiZANidine (ZANAFLEX) 2 MG tablet take 1-2 tablets by mouth every 8 hours if needed 01/27/17   [provider]  topiramate (TOPAMAX) 25 MG tablet Take 50 mg by mouth 2 (two) times daily.     [provider]  traMADol (ULTRAM) 50 MG tablet Take 50 mg by mouth 3 (three) times daily.    [provider]  triamterene-hydrochlorothiazide (MAXZIDE-25) 37.5-25 MG per tablet Take 1 tablet by mouth every morning.    [provider]  Vilazodone HCl (VIIBRYD) 40 MG TABS Take 40 mg by mouth daily after supper.    [provider]    Family History No family history on file.  Social History Social History   Tobacco Use  . Smoking status: Former Smoker    Packs/day: 1.00    Years: 15.00    Pack years: 15.00    Last attempt to quit: 01/31/2017    Years since quitting: 0.9  . Smokeless tobacco: Never Used  Substance Use Topics  . Alcohol use: No  . Drug use: No     Allergies   Bextra [valdecoxib]   Review of Systems Review of Systems  Constitutional: Negative for chills and fever.  Gastrointestinal: Negative for nausea and vomiting.  Musculoskeletal: Negative for arthralgias and joint swelling.  Skin: Positive for color change.       Abscess right buttock  Hematological: Negative for adenopathy.     Physical Exam Updated Vital Signs BP (!) 148/91 (BP Location: Right Arm)   Pulse 94   Temp 97.8 F (36.6 C) (Oral)   Resp 20   Ht 5\' 4"  (1.626 m)   Wt 122.5 kg    SpO2 97%   BMI 46.35 kg/m   Physical Exam Vitals signs and nursing note reviewed.  Constitutional:      General: She is not in acute distress.    Appearance: She is well-developed. She is not toxic-appearing.  HENT:     Head: Normocephalic.  Cardiovascular:     Rate and Rhythm: Normal rate and regular rhythm.     Heart sounds: Normal heart sounds. No murmur.  Pulmonary:     Effort: Pulmonary effort is normal. No respiratory distress.     Breath sounds: Normal breath sounds.  Musculoskeletal: Normal range of motion.  Skin:    General: Skin is warm and dry.     Findings: Erythema present.     Comments: 6 cm focal area of induration, mild erythema of the right mid buttock with a central pustule that is currently draining purulent material.  No significant fluctuance.  She also has two 1 cm papules to the right axilla without fluctuance or surrounding erythema.   Neurological:     General: No focal deficit present.     Mental Status: She is alert.     Sensory: No sensory deficit.     Motor: No abnormal muscle tone.      ED Treatments / Results  Labs (all labs ordered are listed, but only abnormal results are displayed) Labs Reviewed - No data to display  EKG None  Radiology No results found.  Procedures Procedures (including critical care time)   INCISION AND DRAINAGE Performed by: Fleetwood Pierron Consent: Verbal consent obtained. Risks and benefits: risks, benefits and alternatives were discussed Type: abscess  Body area: right buttock  Anesthesia: local infiltration  Incision was made with a #11 scalpel.  Local anesthetic: lidocaine 2% w/o epinephrine  Anesthetic total: 3 ml  Complexity: complex Blunt dissection to break up loculations  Drainage: purulent  Drainage amount: moderate  Packing material: 1/4 in iodoform gauze  Patient tolerance: Patient tolerated the procedure well with no immediate complications.     Medications Ordered in  ED Medications  oxyCODONE-acetaminophen (PERCOCET/ROXICET) 5-325 MG per tablet 1 tablet (has no administration in time range)  lidocaine (XYLOCAINE) 2 % injection 5 mL (has no administration in time range)  povidone-iodine (BETADINE) 10 % external solution (has no administration in time range)     Initial Impression / Assessment and Plan / ED Course  I have reviewed the triage vital signs and the nursing notes.  Pertinent labs & imaging results that were available during my care of the patient were reviewed by me and considered in my medical decision making (see chart for details).     Pt well appearing, non-toxic.  Localized abscess of the buttock with successful I&D.  This is a recurring problem.  She agrees to tx plan of abx, warm soaks and packing removal in 2 days.  Return precautions discussed.   Final Clinical Impressions(s) / ED Diagnoses   Final diagnoses:  Abscess of buttock, right    ED Discharge Orders    None       Pauline Aus, PA-C 01/10/18 1259    Long, Arlyss Repress, MD 01/10/18 1324

## 2018-01-12 MED FILL — Oxycodone w/ Acetaminophen Tab 5-325 MG: ORAL | Qty: 6 | Status: AC

## 2018-04-09 ENCOUNTER — Other Ambulatory Visit: Payer: Self-pay

## 2018-04-09 ENCOUNTER — Emergency Department (HOSPITAL_COMMUNITY)
Admission: EM | Admit: 2018-04-09 | Discharge: 2018-04-09 | Disposition: A | Discharge status: Home / Self Care | Payer: Medicaid Other | Attending: Emergency Medicine | Admitting: Emergency Medicine

## 2018-04-09 ENCOUNTER — Encounter (HOSPITAL_COMMUNITY): Payer: Self-pay | Admitting: *Deleted

## 2018-04-09 DIAGNOSIS — I10 Essential (primary) hypertension: Secondary | ICD-10-CM | POA: Diagnosis not present

## 2018-04-09 DIAGNOSIS — Z79899 Other long term (current) drug therapy: Secondary | ICD-10-CM | POA: Insufficient documentation

## 2018-04-09 DIAGNOSIS — Z7984 Long term (current) use of oral hypoglycemic drugs: Secondary | ICD-10-CM | POA: Diagnosis not present

## 2018-04-09 DIAGNOSIS — N764 Abscess of vulva: Secondary | ICD-10-CM | POA: Insufficient documentation

## 2018-04-09 DIAGNOSIS — N7689 Other specified inflammation of vagina and vulva: Secondary | ICD-10-CM | POA: Diagnosis present

## 2018-04-09 DIAGNOSIS — Z87891 Personal history of nicotine dependence: Secondary | ICD-10-CM | POA: Insufficient documentation

## 2018-04-09 LAB — CBG MONITORING, ED: Glucose-Capillary: 145 mg/dL — ABNORMAL HIGH (ref 70–99)

## 2018-04-09 MED ORDER — LIDOCAINE HCL (PF) 2 % IJ SOLN
INTRAMUSCULAR | Status: AC
  Filled 2018-04-09: qty 40

## 2018-04-09 MED ORDER — LIDOCAINE HCL (PF) 2 % IJ SOLN
5.0000 mL | Freq: Once | INTRAMUSCULAR | Status: AC
  Administered 2018-04-09: 5 mL via INTRADERMAL

## 2018-04-09 MED ORDER — SULFAMETHOXAZOLE-TRIMETHOPRIM 800-160 MG PO TABS: 1.0000 | ORAL_TABLET | Freq: Two times a day (BID) | ORAL | 0 refills | Status: AC

## 2018-04-09 MED ORDER — POVIDONE-IODINE 10 % EX SOLN
CUTANEOUS | Status: DC | PRN
  Administered 2018-04-09: 15:00:00 via TOPICAL
  Filled 2018-04-09: qty 30

## 2018-04-09 NOTE — ED Triage Notes (Signed)
Pt states she has an abscess to her left labia x one week that has been getting worse; pt states she has tried to pop it but with no sucess

## 2018-04-09 NOTE — Discharge Instructions (Addendum)
As discussed, warm water soaks 2-3 times a day.  Take the antibiotic as directed until its finished.  Follow-up with your doctor or return here for any worsening symptoms

## 2018-04-09 NOTE — ED Notes (Signed)
Have given patient pads

## 2018-04-09 NOTE — ED Provider Notes (Signed)
[facility] EMERGENCY DEPARTMENT Provider Note   CSN: [id number] Arrival date & time: 04/09/18  1426    History   Chief Complaint Chief Complaint  Patient presents with  . Abscess    HPI Isabella Walker is a 38 y.o. female.     HPI   Isabella Walker is a 38 y.o. female who presents to the Emergency Department complaining of pain and localized swelling of the left labia.  Symptoms have been present for one week.  She tried squeezing the area and now swelling and pain have worsened.  She reports history of frequent abscesses.  She denies fever, chills, difficulty urinating, abdominal pain and vomiting.  Nothing makes her symptoms better.     Past Medical History:  Diagnosis Date  . Anxiety   . Depression complicating pregnancy, postpartum 2012  . GERD (gastroesophageal reflux disease)   . Headache(784.0)    hx of migraines-last one few months ago.  Marland Kitchen Hypertension     Patient Active Problem List   Diagnosis Date Noted  . Muscle weakness (generalized) 11/05/2011  . SHOULDER PAIN 10/27/2007  . CERVICAL SPASM 10/27/2007    Past Surgical History:  Procedure Laterality Date  . INSERTION OF MESH N/A 05/25/2012   Procedure: INSERTION OF MESH;  Surgeon: Dalia Heading, MD;  Location: [facility];  Service: General;  Laterality: N/A;  . ORIF KNEE DISLOCATION Right   . VENTRAL HERNIA REPAIR N/A 05/25/2012   Procedure: LAPAROSCOPIC VENTRAL HERNIA;  Surgeon: Dalia Heading, MD;  Location: [facility];  Service: General;  Laterality: N/A;     OB History   No obstetric history on file.      Home Medications    Prior to Admission medications   Medication Sig Start Date End Date Taking? Authorizing Provider  buPROPion (WELLBUTRIN SR) 150 MG 12 hr tablet Take 150 mg by mouth 2 (two) times daily. 01/22/17   [provider]  busPIRone (BUSPAR) 10 MG tablet Take 10 mg by mouth 3 (three) times daily.    [provider]  butalbital-acetaminophen-caffeine (FIORICET, ESGIC)  50-325-40 MG per tablet Take 1-2 tablets by mouth every 6 (six) hours as needed for headache.    [provider]  butalbital-acetaminophen-caffeine (FIORICET/CODEINE) 50-325-40-30 MG capsule 1 or 2 po q6h prn headache Patient not taking: Reported on 07/08/2016 05/25/16   Ivery Quale, PA-C  celecoxib (CELEBREX) 100 MG capsule Take 100 mg by mouth 2 (two) times daily.    [provider]  cholecalciferol (VITAMIN D) 1000 units tablet Take 1,000 Units by mouth daily.    [provider]  clonazePAM (KLONOPIN) 0.5 MG tablet Take 0.5 mg by mouth 3 (three) times daily as needed for anxiety.    [provider]  COD LIVER OIL PO Take by mouth.    [provider]  gabapentin (NEURONTIN) 300 MG capsule Take 300 mg by mouth 2 (two) times daily. 1 capsule 2 times daily and 2 capsules at bedtime    [provider]  HYDROcodone-acetaminophen (NORCO/VICODIN) 5-325 MG tablet Take 1 tablet by mouth every 4 (four) hours as needed. Patient not taking: Reported on 02/10/2017 12/20/16   Burgess Amor, PA-C  hydrOXYzine (ATARAX/VISTARIL) 25 MG tablet Take 25 mg by mouth every 8 (eight) hours as needed.    [provider]  levonorgestrel (MIRENA, 52 MG,) 20 MCG/24HR IUD     [provider]  levothyroxine (SYNTHROID, LEVOTHROID) 125 MCG tablet Take 125 mcg by mouth daily before breakfast.  [provider]  loratadine (CLARITIN) 10 MG tablet Take 10 mg by mouth every morning.    [provider]  LYRICA 150 MG capsule  02/03/17   [provider]  metFORMIN (GLUCOPHAGE-XR) 500 MG 24 hr tablet  01/14/17   [provider]  mirtazapine (REMERON) 45 MG tablet Take 45 mg by mouth at bedtime.    [provider]  Multiple Vitamin (MULTIVITAMIN) capsule Take 1 capsule by mouth daily.    [provider]  naproxen sodium (ALEVE) 220 MG tablet Take 440 mg by mouth daily as needed (FOR HEADACHE PAIN).    [provider]  NUCYNTA ER 100 MG 12 hr tablet  01/13/17   [provider]  omeprazole (PRILOSEC) 20 MG capsule Take 40 mg by mouth every morning.     [provider]  oxyCODONE-acetaminophen (PERCOCET/ROXICET) 5-325 MG tablet Take 2 tablets by mouth every 4 (four) hours as needed for severe pain. 01/09/18   Nakira Litzau, PA-C  phentermine 37.5 MG capsule Take 37.5 mg by mouth every morning.    [provider]  sertraline (ZOLOFT) 100 MG tablet take 1/2 tablet once daily for 1 WEEK,THEN 1 TAB DAILY. 01/22/17   [provider]  tiZANidine (ZANAFLEX) 2 MG tablet take 1-2 tablets by mouth every 8 hours if needed 01/27/17   [provider]  topiramate (TOPAMAX) 25 MG tablet Take 50 mg by mouth 2 (two) times daily.     [provider]  traMADol (ULTRAM) 50 MG tablet Take 50 mg by mouth 3 (three) times daily.    [provider]  triamterene-hydrochlorothiazide (MAXZIDE-25) 37.5-25 MG per tablet Take 1 tablet by mouth every morning.    [provider]  Vilazodone HCl (VIIBRYD) 40 MG TABS Take 40 mg by mouth daily after supper.    [provider]    Family History History reviewed. No pertinent family history.  Social History Social History   Tobacco Use  . Smoking status: Former Smoker    Packs/day: 1.00    Years: 15.00    Pack years: 15.00    Last attempt to quit: 01/31/2017    Years since quitting: 1.1  . Smokeless tobacco: Never Used  Substance Use Topics  . Alcohol use: No  . Drug use: No     Allergies   Bextra [valdecoxib]   Review of Systems Review of Systems  Constitutional: Negative for chills and fever.  Gastrointestinal: Negative for abdominal pain, nausea and vomiting.  Genitourinary: Negative for difficulty urinating and dysuria.  Musculoskeletal: Negative for arthralgias and joint swelling.  Skin: Positive for color change.       Abscess   Hematological: Negative for adenopathy.      Physical Exam Updated Vital Signs BP (!) 142/58 (BP Location: Left Arm)   Pulse 81   Temp 98.2 F (36.8 C) (Oral)   Resp 16   Ht 5\' 4"  (1.626 m)   Wt 135.6 kg   SpO2 100%   BMI 51.32 kg/m   Physical Exam Vitals signs and nursing note reviewed.  Constitutional:      General: She is not in acute distress.    Appearance: She is well-developed. She is obese.  Cardiovascular:     Rate and Rhythm: Normal rate and regular rhythm.     Heart sounds: No murmur.  Pulmonary:     Effort: Pulmonary effort is normal. No respiratory distress.  Genitourinary:    Comments: Localized fluctuance and edema of the left  labia majora.  No drainage or surrounding erythema Musculoskeletal: Normal range of motion.  Skin:    General: Skin is warm.     Findings: No rash.  Neurological:     General: No focal deficit present.     Mental Status: She is alert.     Sensory: No sensory deficit.     Motor: No abnormal muscle tone.      ED Treatments / Results  Labs (all labs ordered are listed, but only abnormal results are displayed) Labs Reviewed  CBG MONITORING, ED - Abnormal; Notable for the following components:      Result Value   Glucose-Capillary 145 (*)    All other components within normal limits    EKG None  Radiology No results found.  Procedures Procedures (including critical care time)  INCISION AND DRAINAGE Performed by: Dyshawn Cangelosi Consent: Verbal consent obtained. Risks and benefits: risks, benefits and alternatives were discussed Type: abscess  Body area: left labia  Anesthesia: local infiltration  Incision was made with a #11 scalpel.  Local anesthetic: lidocaine 2% w/o epinephrine  Anesthetic total: 3 ml  Complexity: complex Blunt dissection to break up loculations  Drainage: purulent  Drainage amount: moderate  Packing material: none  Patient tolerance: Patient tolerated the procedure well with no immediate complications.     Medications  Ordered in ED Medications  lidocaine (XYLOCAINE) 2 % injection 5 mL (has no administration in time range)  povidone-iodine (BETADINE) 10 % external solution (has no administration in time range)     Initial Impression / Assessment and Plan / ED Course  I have reviewed the triage vital signs and the nursing notes.  Pertinent labs & imaging results that were available during my care of the patient were reviewed by me and considered in my medical decision making (see chart for details).        Diabetic patient with abscess of left labia.  She is otherwise well appearing, non-toxic.  Symptomatic improvement after I&D.  She agrees to frequent warm water soaks, abx, and ER return if symptoms worsen.  She has prescription pain medication at home.     Final Clinical Impressions(s) / ED Diagnoses   Final diagnoses:  Labial abscess    ED Discharge Orders    None       Rosey Bath 04/09/18 1534    Raeford Razor, MD 04/10/18 1151

## 2018-08-12 ENCOUNTER — Encounter: Payer: Self-pay | Admitting: Orthopedic Surgery

## 2018-08-12 ENCOUNTER — Other Ambulatory Visit: Payer: Self-pay

## 2018-08-12 ENCOUNTER — Ambulatory Visit: Payer: Medicaid Other | Admitting: Orthopedic Surgery

## 2018-08-12 VITALS — BP 132/71 | HR 76 | Ht 63.5 in | Wt 318.0 lb

## 2018-08-12 DIAGNOSIS — Z6841 Body Mass Index (BMI) 40.0 and over, adult: Secondary | ICD-10-CM | POA: Diagnosis not present

## 2018-08-12 DIAGNOSIS — G5603 Carpal tunnel syndrome, bilateral upper limbs: Secondary | ICD-10-CM | POA: Diagnosis not present

## 2018-08-12 NOTE — Patient Instructions (Addendum)
Open Carpal Tunnel Release  Open carpal tunnel release is a surgery to relieve symptoms caused by carpal tunnel syndrome. The carpal tunnel is a narrow, hollow space in the wrist. It is located between the wrist bones and a band of connective tissue (transverse carpal ligament, also known as the flexor retinaculum). The nerve that supplies most of the hand (median nerve) passes through the carpal tunnel, and so do tissues that connect bones to muscles (tendons) in the hand and arm. Carpal tunnel syndrome makes this space swell and become narrow. The swelling pinches the median nerve and causes pain and numbness. During carpal tunnel release surgery, the transverse carpal ligament is cut to make more room in the carpal tunnel space. This also lessens the pressure on the median nerve. You may have this surgery if other types of treatment have not relieved your carpal tunnel symptoms. This surgery is usually done only for the hand that you use more often (dominant hand), but it may be done for both hands depending on your symptoms. Tell a health care provider about:  Any allergies you have.  All medicines you are taking, including vitamins, herbs, eye drops, creams, and over-the-counter medicines.  Any problems you or family members have had with anesthetic medicines.  Any blood disorders you have.  Any surgeries you have had.  Any medical conditions you have.  Whether you are pregnant or may be pregnant. What are the risks? Generally, this is a safe procedure. However, problems may occur, including:  Infection.  Bleeding.  Injury to the median nerve.  Allergic reactions to medicines.  The surgery failing to relieve your symptoms, or making your symptoms worse. What happens before the procedure? Medicines  Ask your health care provider about: ? Changing or stopping your regular medicines. This is especially important if you are taking diabetes medicines or blood thinners. ? Taking  medicines such as aspirin and ibuprofen. These medicines can thin your blood. Do not take these medicines unless your health care provider tells you to take them. ? Taking over-the-counter medicines, vitamins, herbs, and supplements.  You may be given antibiotic medicine to help prevent infection. Staying hydrated Follow instructions from your health care provider about hydration, which may include:  Up to 2 hours before the procedure - you may continue to drink clear liquids, such as water, clear fruit juice, black coffee, and plain tea. Eating and drinking restrictions Follow instructions from your health care provider about eating and drinking, which may include:  8 hours before the procedure - stop eating heavy meals or foods such as meat, fried foods, or fatty foods.  6 hours before the procedure - stop eating light meals or foods, such as toast or cereal.  6 hours before the procedure - stop drinking milk or drinks that contain milk.  2 hours before the procedure - stop drinking clear liquids. General instructions  Ask your health care provider how your surgical site will be marked or identified.  You may be asked to shower with a germ-killing soap.  Plan to have someone take you home from the hospital or clinic.  Plan to have a responsible adult care for you for at least 24 hours after you leave the hospital or clinic. This is important. What happens during the procedure?  To lower your risk of infection: ? Your health care team will wash or sanitize their hands. ? Hair may be removed from the surgical area. ? Your arm, hand, and wrist will be cleaned with   a germ-killing (antiseptic) solution.  An IV will be inserted into one of your veins.  You will be given one of the following: ? A medicine to numb the wrist area (local anesthetic). You may also be given a medicine to help you relax (sedative). ? A medicine to make you fall asleep (general anesthetic).  An incision  will be made in your wrist, on the same side as your palm.  The skin of your wrist will be spread to expose the transverse carpal ligament.  The transverse carpal ligament will be cut to make more room in the carpal tunnel space.  Your incision will be closed with stitches (sutures) or staples.  A bandage (dressing) will be placed over your wrist and wrapped around your hand and wrist. The procedure may vary among health care providers and hospitals. What happens after the procedure?  Your blood pressure, heart rate, breathing rate, and blood oxygen level will be monitored until the medicines you were given have worn off.  You will be given pain medicine as needed.  A splint or brace may be placed over your dressing, to hold your hand and wrist in place while you heal.  Do not drive until your health care provider approves. Summary  Carpal tunnel release is a surgery to relieve pain and numbness in the hand caused by swelling around a nerve (carpal tunnel syndrome).  You may have this surgery if other types of treatment have not relieved your carpal tunnel symptoms.  During carpal tunnel release surgery, a band of connective tissue (transverse carpal ligament) is cut to make more room in the carpal tunnel space. This information is not intended to replace advice given to you by your health care provider. Make sure you discuss any questions you have with your health care provider. Document Released: 03/16/2003 Document Revised: 12/06/2016 Document Reviewed: 09/02/2016 Elsevier Patient Education  2020 [employer]  

## 2018-08-12 NOTE — Progress Notes (Signed)
Isabella Walker  08/12/2018  HISTORY SECTION :  Chief Complaint  Patient presents with  . Carpal Tunnel    bilateral    HPI The patient presents for evaluation of bilateral carpal tunnel syndrome  She is 37 she has thyroid disease and diabetes she has chronic pain syndrome she has had bracing gabapentin Neurontin multiple injections with no relief she has a positive carpal tunnel test by local neurologist indicating bilateral severe median neuropathy wrist carpal tunnel syndrome  Pain and numbness are located both hands all 4 fingers occasional thumb present for several months best described as burning numbness and tingling worse after most recent injection  Review of Systems  HENT: Positive for congestion, hearing loss and tinnitus.   Cardiovascular: Positive for leg swelling and PND.  Gastrointestinal: Positive for constipation, heartburn and nausea.  Musculoskeletal: Positive for back pain, joint pain and neck pain.  Endo/Heme/Allergies: Positive for environmental allergies.  Psychiatric/Behavioral: Positive for depression and memory loss. The patient has insomnia.   All other systems reviewed and are negative.    Past Medical History:  Diagnosis Date  . Anxiety   . Depression complicating pregnancy, postpartum 2012  . GERD (gastroesophageal reflux disease)   . Headache(784.0)    hx of migraines-last one few months ago.  [place] Kitchen. Hypertension     Past Surgical History:  Procedure Laterality Date  . INSERTION OF MESH N/A 05/25/2012   Procedure: INSERTION OF MESH;  Surgeon: Dalia HeadingMark A Jenkins, MD;  Location: [facility];  Service: General;  Laterality: N/A;  . ORIF KNEE DISLOCATION Right   . VENTRAL HERNIA REPAIR N/A 05/25/2012   Procedure: LAPAROSCOPIC VENTRAL HERNIA;  Surgeon: Dalia HeadingMark A Jenkins, MD;  Location: [facility];  Service: General;  Laterality: N/A;     Allergies  Allergen Reactions  . Bextra [Valdecoxib] Other (See Comments)    SEVERE NOSE BLEEDS     Current Outpatient  Medications:  .  AIMOVIG 140 MG/ML SOAJ, INJECT CONTENTS OF 1 INJECTOR INTO THE SKIN ONCE MONTHLY AS DIRECTED, Disp: , Rfl:  .  buPROPion (WELLBUTRIN SR) 150 MG 12 hr tablet, Take 150 mg by mouth 2 (two) times daily., Disp: , Rfl: 0 .  busPIRone (BUSPAR) 10 MG tablet, Take 10 mg by mouth 3 (three) times daily., Disp: , Rfl:  .  butalbital-acetaminophen-caffeine (FIORICET, ESGIC) 50-325-40 MG per tablet, Take 1-2 tablets by mouth every 6 (six) hours as needed for headache., Disp: , Rfl:  .  butalbital-acetaminophen-caffeine (FIORICET/CODEINE) 50-325-40-30 MG capsule, 1 or 2 po q6h prn headache, Disp: 15 capsule, Rfl: 0 .  celecoxib (CELEBREX) 100 MG capsule, Take 100 mg by mouth 2 (two) times daily., Disp: , Rfl:  .  cholecalciferol (VITAMIN D) 1000 units tablet, Take 1,000 Units by mouth daily., Disp: , Rfl:  .  clonazePAM (KLONOPIN) 0.5 MG tablet, Take 0.5 mg by mouth 3 (three) times daily as needed for anxiety., Disp: , Rfl:  .  COD LIVER OIL PO, Take by mouth., Disp: , Rfl:  .  gabapentin (NEURONTIN) 300 MG capsule, Take 300 mg by mouth 2 (two) times daily. 1 capsule 2 times daily and 2 capsules at bedtime, Disp: , Rfl:  .  HYDROcodone-acetaminophen (NORCO/VICODIN) 5-325 MG tablet, Take 1 tablet by mouth every 4 (four) hours as needed., Disp: 15 tablet, Rfl: 0 .  hydrOXYzine (ATARAX/VISTARIL) 25 MG tablet, Take 25 mg by mouth every 8 (eight) hours as needed., Disp: , Rfl:  .  levonorgestrel (MIRENA, 52 MG,) 20 MCG/24HR IUD, , Disp: ,  Rfl:  .  levothyroxine (SYNTHROID, LEVOTHROID) 125 MCG tablet, Take 125 mcg by mouth daily before breakfast., Disp: , Rfl:  .  loratadine (CLARITIN) 10 MG tablet, Take 10 mg by mouth every morning., Disp: , Rfl:  .  LYRICA 150 MG capsule, , Disp: , Rfl: 0 .  metFORMIN (GLUCOPHAGE-XR) 500 MG 24 hr tablet, , Disp: , Rfl: 1 .  mirtazapine (REMERON) 45 MG tablet, Take 45 mg by mouth at bedtime., Disp: , Rfl:  .  Multiple Vitamin (MULTIVITAMIN) capsule, Take 1 capsule  by mouth daily., Disp: , Rfl:  .  naproxen sodium (ALEVE) 220 MG tablet, Take 440 mg by mouth daily as needed (FOR HEADACHE PAIN)., Disp: , Rfl:  .  omeprazole (PRILOSEC) 20 MG capsule, Take 40 mg by mouth every morning. , Disp: , Rfl:  .  phentermine 37.5 MG capsule, Take 37.5 mg by mouth every morning., Disp: , Rfl:  .  sertraline (ZOLOFT) 100 MG tablet, take 1/2 tablet once daily for 1 WEEK,THEN 1 TAB DAILY., Disp: , Rfl: 0 .  tiZANidine (ZANAFLEX) 2 MG tablet, take 1-2 tablets by mouth every 8 hours if needed, Disp: , Rfl: 0 .  topiramate (TOPAMAX) 25 MG tablet, Take 50 mg by mouth 2 (two) times daily. , Disp: , Rfl:  .  triamterene-hydrochlorothiazide (MAXZIDE-25) 37.5-25 MG per tablet, Take 1 tablet by mouth every morning., Disp: , Rfl:  .  Vilazodone HCl (VIIBRYD) 40 MG TABS, Take 40 mg by mouth daily after supper., Disp: , Rfl:    PHYSICAL EXAM SECTION: 1) BP 132/71   Pulse 76   Ht 5' 3.5" (1.613 m)   Wt (!) 318 lb (144.2 kg)   BMI 55.45 kg/m   Body mass index is 55.45 kg/m. General appearance: Well-developed well-nourished no gross deformities  2) Cardiovascular normal pulse and perfusion in the upper extremities 3) Neurologically deep tendon reflexes are equal and normal, no pathologic reflexes  4) Psychological: Awake alert and oriented x3 mood and affect normal  5) Skin no lacerations or ulcerations no nodularity no palpable masses, no erythema or nodularity  6) Musculoskeletal: Bilateral numbness and tingling both hands specifically the index long and ring finger with normal range of motion in both upper extremities including the hand weak grip strength positive Tinel's positive compression test positive Phalen's sign immediately.  Severe disease noted  Color normal capillary refill normal  MEDICAL DECISION SECTION:  Encounter Diagnoses  Name Primary?  . Bilateral carpal tunnel syndrome Yes  . Body mass index 50.0-59.9, adult ([place])   . Morbid obesity ([place])    The  patient meets the AMA guidelines for Morbid (severe) obesity with a BMI > 40.0 and I have recommended weight loss.  Imaging No imaging report read into the record  Plan:  (Rx., Inj., surg., Frx, MRI/CT, XR:2)  Bilateral carpal tunnel releases  The procedure has been fully reviewed with the patient; The risks and benefits of surgery have been discussed and explained and understood. Alternative treatment has also been reviewed, questions were encouraged and answered. The postoperative plan is also been reviewed.  Specific counseling was made regarding thyroid disease and diabetes a compromise results and getting complete relief and obesity as well as counseling about 6 months of tenderness around the incision  9:57 AM Arther Abbott, MD  08/12/2018

## 2018-08-14 NOTE — Patient Instructions (Signed)
Isabella Walker  08/14/2018     @PREFPERIOPPHARMACY @   Your procedure is scheduled on  08/20/2018.  Report to [facility] at  845  A.M.  Call this number if you have problems the morning of surgery:  [phone]   Remember:  Do not eat or drink after midnight.                          Take these medicines the morning of surgery with A SIP OF WATER  Wellbutrin, celebrex, hydrocodone(if needed), hydroxyzine, levothyroxine, claritn, lyrica, prilosec, zoloft, zanaflex(if needed).    Do not wear jewelry, make-up or nail polish.  Do not wear lotions, powders, or perfumes. Please wear deodorant and brush your teeth.  Do not shave 48 hours prior to surgery.  Men may shave face and neck.  Do not bring valuables to the hospital.  [facility] is not responsible for any belongings or valuables.  Contacts, dentures or bridgework may not be worn into surgery.  Leave your suitcase in the car.  After surgery it may be brought to your room.  For patients admitted to the hospital, discharge time will be determined by your treatment team.  Patients discharged the day of surgery will not be allowed to drive home.   Name and phone number of your driver:   family Special instructions:  None  Please read over the following fact sheets that you were given. Anesthesia Post-op Instructions and Care and Recovery After Surgery       Open Carpal Tunnel Release, Care After This sheet gives you information about how to care for yourself after your procedure. Your health care provider may also give you more specific instructions. If you have problems or questions, contact your health care provider. What can I expect after the procedure? After the procedure, it is common to have:  Wrist stiffness.  Bruising. Follow these instructions at home: Bathing  Do not take baths, swim, or use a hot tub until your health care provider approves. Ask your health care provider if you may take  showers.  Keep your bandage (dressing) dry until your health care provider says it can be removed. If you have a splint or brace:  Wear the splint or brace as told by your health care provider. You may need to wear it for 2-3 weeks. Remove it only as told by your health care provider.  Loosen the splint or brace if your fingers tingle, become numb, or turn cold and blue.  Keep the splint or brace clean.  If the splint or brace is not waterproof: ? Do not let it get wet. ? Cover it with a watertight covering when you take a bath or a shower. Incision care   Follow instructions from your health care provider about how to take care of your incision. Make sure you: ? Wash your hands with soap and water before you change your dressing. If soap and water are not available, use hand sanitizer. ? Change your dressing as told by your health care provider. ? Leave stitches (sutures), skin glue, or adhesive strips in place. These skin closures may need to stay in place for 2 weeks or longer. If adhesive strip edges start to loosen and curl up, you may trim the loose edges. Do not remove adhesive strips completely unless your health care provider tells you to do that.  Check your incision area  every day for signs of infection. Check for: ? Redness, swelling, or pain. ? Fluid or blood. ? Warmth. ? Pus or a bad smell. Managing pain, stiffness, and swelling   If directed, put ice on the affected area. ? If you have a removable splint or brace, remove it as told by your health care provider. ? Put ice in a plastic bag. ? Place a towel between your skin and the bag. ? Leave the ice on for 20 minutes, 2-3 times a day.  Move your fingers often to avoid stiffness and to lessen swelling.  Raise (elevate) your wrist above the level of your heart while you are sitting or lying down. Activity  Do not drive until your health care provider approves.  Do not drive or use heavy machinery while  taking prescription pain medicine.  Return to your normal activities as told by your health care provider. Avoid activities that cause pain.  If physical therapy was prescribed, do exercises as told by your therapist. Physical therapy can help you heal faster and regain movement. General instructions  Take over-the-counter and prescription medicines only as told by your health care provider.  If you are taking prescription pain medicine, take actions to prevent or treat constipation. Your health care provider may recommend that you: ? Drink enough fluid to keep your urine pale yellow. ? Eat foods that are high in fiber, such as fresh fruits and vegetables, whole grains, and beans. ? Limit foods that are high in fat and processed sugars, such as fried or sweet foods. ? Take an over-the-counter or prescription medicine for constipation.  Do not use any products that contain nicotine or tobacco, such as cigarettes and e-cigarettes. If you need help quitting, ask your health care provider.  Keep all follow-up visits as told by your health care provider and physical therapist. This is important. Contact a health care provider if:  You have redness or swelling around your incision.  You have fluid or blood coming from your incision.  Your incision feels warm to the touch.  You have pus or a bad smell coming from your incision.  You have a fever.  You have chills.  You have pain that does not get better with medicine.  Your carpal tunnel symptoms do not go away after 2 months.  Your carpal tunnel symptoms go away and then come back. Get help right away if:  You have pain or numbness that is getting worse.  Your fingers or fingertips become very pale or bluish in color.  You are not able to move your fingers. Summary  It is common to have wrist stiffness and bruising after a carpal tunnel release.  Icing and raising (elevating) your wrist may help to lessen swelling and pain.   Call your health care provider if you have a fever or notice any signs of infection in your incision area. This information is not intended to replace advice given to you by your health care provider. Make sure you discuss any questions you have with your health care provider. Document Released: 07/13/2004 Document Revised: 12/06/2016 Document Reviewed: 09/02/2016 Elsevier Patient Education  2020 Elsevier Inc.  Monitored Anesthesia Care, Care After These instructions provide you with information about caring for yourself after your procedure. Your health care provider may also give you more specific instructions. Your treatment has been planned according to current medical practices, but problems sometimes occur. Call your health care provider if you have any problems or questions after your procedure.  What can I expect after the procedure? After your procedure, you may:  Feel sleepy for several hours.  Feel clumsy and have poor balance for several hours.  Feel forgetful about what happened after the procedure.  Have poor judgment for several hours.  Feel nauseous or vomit.  Have a sore throat if you had a breathing tube during the procedure. Follow these instructions at home: For at least 24 hours after the procedure:      Have a responsible adult stay with you. It is important to have someone help care for you until you are awake and alert.  Rest as needed.  Do not: ? Participate in activities in which you could fall or become injured. ? Drive. ? Use heavy machinery. ? Drink alcohol. ? Take sleeping pills or medicines that cause drowsiness. ? Make important decisions or sign legal documents. ? Take care of children on your own. Eating and drinking  Follow the diet that is recommended by your health care provider.  If you vomit, drink water, juice, or soup when you can drink without vomiting.  Make sure you have little or no nausea before eating solid foods. General  instructions  Take over-the-counter and prescription medicines only as told by your health care provider.  If you have sleep apnea, surgery and certain medicines can increase your risk for breathing problems. Follow instructions from your health care provider about wearing your sleep device: ? Anytime you are sleeping, including during daytime naps. ? While taking prescription pain medicines, sleeping medicines, or medicines that make you drowsy.  If you smoke, do not smoke without supervision.  Keep all follow-up visits as told by your health care provider. This is important. Contact a health care provider if:  You keep feeling nauseous or you keep vomiting.  You feel light-headed.  You develop a rash.  You have a fever. Get help right away if:  You have trouble breathing. Summary  For several hours after your procedure, you may feel sleepy and have poor judgment.  Have a responsible adult stay with you for at least 24 hours or until you are awake and alert. This information is not intended to replace advice given to you by your health care provider. Make sure you discuss any questions you have with your health care provider. Document Released: 04/16/2015 Document Revised: 03/24/2017 Document Reviewed: 04/16/2015 Elsevier Patient Education  2020 [employer]  How to Use Chlorhexidine for Bathing Chlorhexidine gluconate (CHG) is a germ-killing (antiseptic) solution that is used to clean the skin. It can get rid of the bacteria that normally live on the skin and can keep them away for about 24 hours. To clean your skin with CHG, you may be given:  A CHG solution to use in the shower or as part of a sponge bath.  A prepackaged cloth that contains CHG. Cleaning your skin with CHG may help lower the risk for infection:  While you are staying in the intensive care unit of the hospital.  If you have a vascular access, such as a central line, to provide short-term or long-term  access to your veins.  If you have a catheter to drain urine from your bladder.  If you are on a ventilator. A ventilator is a machine that helps you breathe by moving air in and out of your lungs.  After surgery. What are the risks? Risks of using CHG include:  A skin reaction.  Hearing loss, if CHG gets in your ears.  Eye injury, if CHG gets in your eyes and is not rinsed out.  The CHG product catching fire. Make sure that you avoid smoking and flames after applying CHG to your skin. Do not use CHG:  If you have a chlorhexidine allergy or have previously reacted to chlorhexidine.  On babies younger than 81 months of age. How to use CHG solution  Use CHG only as told by your health care provider, and follow the instructions on the label.  Use the full amount of CHG as directed. Usually, this is one bottle. During a shower Follow these steps when using CHG solution during a shower (unless your health care provider gives you different instructions): 1. Start the shower. 2. Use your normal soap and shampoo to wash your face and hair. 3. Turn off the shower or move out of the shower stream. 4. Pour the CHG onto a clean washcloth. Do not use any type of brush or rough-edged sponge. 5. Starting at your neck, lather your body down to your toes. Make sure you follow these instructions: ? If you will be having surgery, pay special attention to the part of your body where you will be having surgery. Scrub this area for at least 1 minute. ? Do not use CHG on your head or face. If the solution gets into your ears or eyes, rinse them well with water. ? Avoid your genital area. ? Avoid any areas of skin that have broken skin, cuts, or scrapes. ? Scrub your back and under your arms. Make sure to wash skin folds. 6. Let the lather sit on your skin for 1-2 minutes or as long as told by your health care provider. 7. Thoroughly rinse your entire body in the shower. Make sure that all body  creases and crevices are rinsed well. 8. Dry off with a clean towel. Do not put any substances on your body afterward-such as powder, lotion, or perfume-unless you are told to do so by your health care provider. Only use lotions that are recommended by the manufacturer. 9. Put on clean clothes or pajamas. 10. If it is the night before your surgery, sleep in clean sheets.  During a sponge bath Follow these steps when using CHG solution during a sponge bath (unless your health care provider gives you different instructions): 1. Use your normal soap and shampoo to wash your face and hair. 2. Pour the CHG onto a clean washcloth. 3. Starting at your neck, lather your body down to your toes. Make sure you follow these instructions: ? If you will be having surgery, pay special attention to the part of your body where you will be having surgery. Scrub this area for at least 1 minute. ? Do not use CHG on your head or face. If the solution gets into your ears or eyes, rinse them well with water. ? Avoid your genital area. ? Avoid any areas of skin that have broken skin, cuts, or scrapes. ? Scrub your back and under your arms. Make sure to wash skin folds. 4. Let the lather sit on your skin for 1-2 minutes or as long as told by your health care provider. 5. Using a different clean, wet washcloth, thoroughly rinse your entire body. Make sure that all body creases and crevices are rinsed well. 6. Dry off with a clean towel. Do not put any substances on your body afterward-such as powder, lotion, or perfume-unless you are told to do so by your health care provider. Only use  lotions that are recommended by the manufacturer. 7. Put on clean clothes or pajamas. 8. If it is the night before your surgery, sleep in clean sheets. How to use CHG prepackaged cloths  Only use CHG cloths as told by your health care provider, and follow the instructions on the label.  Use the CHG cloth on clean, dry skin.  Do not  use the CHG cloth on your head or face unless your health care provider tells you to.  When washing with the CHG cloth: ? Avoid your genital area. ? Avoid any areas of skin that have broken skin, cuts, or scrapes. Before surgery Follow these steps when using a CHG cloth to clean before surgery (unless your health care provider gives you different instructions): 1. Using the CHG cloth, vigorously scrub the part of your body where you will be having surgery. Scrub using a back-and-forth motion for 3 minutes. The area on your body should be completely wet with CHG when you are done scrubbing. 2. Do not rinse. Discard the cloth and let the area air-dry. Do not put any substances on the area afterward, such as powder, lotion, or perfume. 3. Put on clean clothes or pajamas. 4. If it is the night before your surgery, sleep in clean sheets.  For general bathing Follow these steps when using CHG cloths for general bathing (unless your health care provider gives you different instructions). 1. Use a separate CHG cloth for each area of your body. Make sure you wash between any folds of skin and between your fingers and toes. Wash your body in the following order, switching to a new cloth after each step: ? The front of your neck, shoulders, and chest. ? Both of your arms, under your arms, and your hands. ? Your stomach and groin area, avoiding the genitals. ? Your right leg and foot. ? Your left leg and foot. ? The back of your neck, your back, and your buttocks. 2. Do not rinse. Discard the cloth and let the area air-dry. Do not put any substances on your body afterward-such as powder, lotion, or perfume-unless you are told to do so by your health care provider. Only use lotions that are recommended by the manufacturer. 3. Put on clean clothes or pajamas. Contact a health care provider if:  Your skin gets irritated after scrubbing.  You have questions about using your solution or cloth. Get help  right away if:  Your eyes become very red or swollen.  Your eyes itch badly.  Your skin itches badly and is red or swollen.  Your hearing changes.  You have trouble seeing.  You have swelling or tingling in your mouth or throat.  You have trouble breathing.  You swallow any chlorhexidine. Summary  Chlorhexidine gluconate (CHG) is a germ-killing (antiseptic) solution that is used to clean the skin. Cleaning your skin with CHG may help to lower your risk for infection.  You may be given CHG to use for bathing. It may be in a bottle or in a prepackaged cloth to use on your skin. Carefully follow your health care provider's instructions and the instructions on the product label.  Do not use CHG if you have a chlorhexidine allergy.  Contact your health care provider if your skin gets irritated after scrubbing. This information is not intended to replace advice given to you by your health care provider. Make sure you discuss any questions you have with your health care provider. Document Released: 09/18/2011 Document Revised: 03/12/2018  Document Reviewed: 11/21/2016 Elsevier Patient Education  [employer]2020 Elsevier Inc.

## 2018-08-18 ENCOUNTER — Other Ambulatory Visit: Payer: Self-pay

## 2018-08-18 ENCOUNTER — Other Ambulatory Visit (HOSPITAL_COMMUNITY)
Admission: RE | Admit: 2018-08-18 | Discharge: 2018-08-18 | Disposition: A | Discharge status: Home / Self Care | Payer: Medicaid Other | Source: Ambulatory Visit | Attending: Orthopedic Surgery | Admitting: Orthopedic Surgery

## 2018-08-18 ENCOUNTER — Encounter (HOSPITAL_COMMUNITY): Payer: Self-pay

## 2018-08-18 ENCOUNTER — Encounter (HOSPITAL_COMMUNITY)
Admission: RE | Admit: 2018-08-18 | Discharge: 2018-08-18 | Disposition: A | Discharge status: Home / Self Care | Payer: Medicaid Other | Source: Ambulatory Visit | Attending: Orthopedic Surgery | Admitting: Orthopedic Surgery

## 2018-08-18 DIAGNOSIS — Z20828 Contact with and (suspected) exposure to other viral communicable diseases: Secondary | ICD-10-CM | POA: Diagnosis not present

## 2018-08-18 DIAGNOSIS — G5602 Carpal tunnel syndrome, left upper limb: Secondary | ICD-10-CM | POA: Diagnosis not present

## 2018-08-18 DIAGNOSIS — Z01812 Encounter for preprocedural laboratory examination: Secondary | ICD-10-CM | POA: Diagnosis present

## 2018-08-18 HISTORY — DX: Other complications of anesthesia, initial encounter: T88.59XA

## 2018-08-18 LAB — SARS CORONAVIRUS 2 (TAT 6-24 HRS): SARS Coronavirus 2: NEGATIVE

## 2018-08-18 LAB — BASIC METABOLIC PANEL
Anion gap: 8 (ref 5–15)
BUN: 17 mg/dL (ref 6–20)
CO2: 20 mmol/L — ABNORMAL LOW (ref 22–32)
Calcium: 8.6 mg/dL — ABNORMAL LOW (ref 8.9–10.3)
Chloride: 110 mmol/L (ref 98–111)
Creatinine, Ser: 0.71 mg/dL (ref 0.44–1.00)
GFR calc Af Amer: >60 mL/min (ref >60)
GFR calc non Af Amer: >60 mL/min (ref >60)
Glucose, Bld: 149 mg/dL — ABNORMAL HIGH (ref 70–99)
Potassium: 3.8 mmol/L (ref 3.5–5.1)
Sodium: 138 mmol/L (ref 135–145)

## 2018-08-18 LAB — CBC WITH DIFFERENTIAL/PLATELET
Abs Immature Granulocytes: 0.04 10*3/uL (ref 0.00–0.07)
Basophils Absolute: 0 10*3/uL (ref 0.0–0.1)
Basophils Relative: 0 %
Eosinophils Absolute: 0.3 10*3/uL (ref 0.0–0.5)
Eosinophils Relative: 4 %
HCT: 38.5 % (ref 36.0–46.0)
Hemoglobin: 11.9 g/dL — ABNORMAL LOW (ref 12.0–15.0)
Immature Granulocytes: 1 %
Lymphocytes Relative: 21 %
Lymphs Abs: 1.8 10*3/uL (ref 0.7–4.0)
MCH: 31.9 pg (ref 26.0–34.0)
MCHC: 30.9 g/dL (ref 30.0–36.0)
MCV: 103.2 fL — ABNORMAL HIGH (ref 80.0–100.0)
Monocytes Absolute: 0.3 10*3/uL (ref 0.1–1.0)
Monocytes Relative: 4 %
Neutro Abs: 6.1 10*3/uL (ref 1.7–7.7)
Neutrophils Relative %: 70 %
Platelets: 230 10*3/uL (ref 150–400)
RBC: 3.73 MIL/uL — ABNORMAL LOW (ref 3.87–5.11)
RDW: 13.3 % (ref 11.5–15.5)
WBC: 8.7 10*3/uL (ref 4.0–10.5)
nRBC: 0 % (ref 0.0–0.2)

## 2018-08-18 LAB — HEMOGLOBIN A1C
Hgb A1c MFr Bld: 5.7 % — ABNORMAL HIGH (ref 4.8–5.6)
Mean Plasma Glucose: 116.89 mg/dL

## 2018-08-18 LAB — GLUCOSE, CAPILLARY: Glucose-Capillary: 141 mg/dL — ABNORMAL HIGH (ref 70–99)

## 2018-08-18 LAB — PREGNANCY, URINE: Preg Test, Ur: NEGATIVE

## 2018-08-18 NOTE — Progress Notes (Signed)
   08/18/18 1406  OBSTRUCTIVE SLEEP APNEA  Have you ever been diagnosed with sleep apnea through a sleep study? No  Do you snore loudly (loud enough to be heard through closed doors)?  1  Do you often feel tired, fatigued, or sleepy during the daytime (such as falling asleep during driving or talking to someone)? 1  Has anyone observed you stop breathing during your sleep? 1  BMI more than 35 kg/m2? 1  Age > 50 (1-yes) 0  Neck circumference greater than:Female 16 inches or larger, Female 17inches or larger? 1  Female Gender (Yes=1) 0  Obstructive Sleep Apnea Score 5  Score 5 or greater  Results sent to PCP

## 2018-08-19 NOTE — Pre-Procedure Instructions (Signed)
HgA1c routed to PCP.

## 2018-08-19 NOTE — Progress Notes (Signed)
Patient made aware to arrive at 615 on 08/20/18.  Verbalized understanding.

## 2018-08-19 NOTE — H&P (Signed)
Isabella Walker   08/12/2018   HISTORY SECTION :       Chief Complaint  Patient presents with  . Carpal Tunnel      bilateral    HPI The patient presents for evaluation of bilateral carpal tunnel syndrome   She is 37 she has thyroid disease and diabetes she has chronic pain syndrome she has had bracing gabapentin Neurontin multiple injections with no relief she has a positive carpal tunnel test by local neurologist indicating bilateral severe median neuropathy wrist carpal tunnel syndrome   Pain and numbness are located both hands all 4 fingers occasional thumb present for several months best described as burning numbness and tingling worse after most recent injection   Review of Systems  HENT: Positive for congestion, hearing loss and tinnitus.   Cardiovascular: Positive for leg swelling and PND.  Gastrointestinal: Positive for constipation, heartburn and nausea.  Musculoskeletal: Positive for back pain, joint pain and neck pain.  Endo/Heme/Allergies: Positive for environmental allergies.  Psychiatric/Behavioral: Positive for depression and memory loss. The patient has insomnia.   All other systems reviewed and are negative.           Past Medical History:  Diagnosis Date  . Anxiety    . Depression complicating pregnancy, postpartum 2012  . GERD (gastroesophageal reflux disease)    . Headache(784.0)      hx of migraines-last one few months ago.  Marland Kitchen Hypertension            Past Surgical History:  Procedure Laterality Date  . INSERTION OF MESH N/A 05/25/2012    Procedure: INSERTION OF MESH;  Surgeon: Jamesetta So, MD;  Location: [facility];  Service: General;  Laterality: N/A;  . ORIF KNEE DISLOCATION Right    . VENTRAL HERNIA REPAIR N/A 05/25/2012    Procedure: LAPAROSCOPIC VENTRAL HERNIA;  Surgeon: Jamesetta So, MD;  Location: [facility];  Service: General;  Laterality: N/A;            Allergies  Allergen Reactions  . Bextra [Valdecoxib] Other (See Comments)   SEVERE NOSE BLEEDS      Current Outpatient Medications:  .  AIMOVIG 140 MG/ML SOAJ, INJECT CONTENTS OF 1 INJECTOR INTO THE SKIN ONCE MONTHLY AS DIRECTED, Disp: , Rfl:  .  buPROPion (WELLBUTRIN SR) 150 MG 12 hr tablet, Take 150 mg by mouth 2 (two) times daily., Disp: , Rfl: 0 .  busPIRone (BUSPAR) 10 MG tablet, Take 10 mg by mouth 3 (three) times daily., Disp: , Rfl:  .  butalbital-acetaminophen-caffeine (FIORICET, ESGIC) 50-325-40 MG per tablet, Take 1-2 tablets by mouth every 6 (six) hours as needed for headache., Disp: , Rfl:  .  butalbital-acetaminophen-caffeine (FIORICET/CODEINE) 50-325-40-30 MG capsule, 1 or 2 po q6h prn headache, Disp: 15 capsule, Rfl: 0 .  celecoxib (CELEBREX) 100 MG capsule, Take 100 mg by mouth 2 (two) times daily., Disp: , Rfl:  .  cholecalciferol (VITAMIN D) 1000 units tablet, Take 1,000 Units by mouth daily., Disp: , Rfl:  .  clonazePAM (KLONOPIN) 0.5 MG tablet, Take 0.5 mg by mouth 3 (three) times daily as needed for anxiety., Disp: , Rfl:  .  COD LIVER OIL PO, Take by mouth., Disp: , Rfl:  .  gabapentin (NEURONTIN) 300 MG capsule, Take 300 mg by mouth 2 (two) times daily. 1 capsule 2 times daily and 2 capsules at bedtime, Disp: , Rfl:  .  HYDROcodone-acetaminophen (NORCO/VICODIN) 5-325 MG tablet, Take 1 tablet by mouth every 4 (four) hours as  needed., Disp: 15 tablet, Rfl: 0 .  hydrOXYzine (ATARAX/VISTARIL) 25 MG tablet, Take 25 mg by mouth every 8 (eight) hours as needed., Disp: , Rfl:  .  levonorgestrel (MIRENA, 52 MG,) 20 MCG/24HR IUD, , Disp: , Rfl:  .  levothyroxine (SYNTHROID, LEVOTHROID) 125 MCG tablet, Take 125 mcg by mouth daily before breakfast., Disp: , Rfl:  .  loratadine (CLARITIN) 10 MG tablet, Take 10 mg by mouth every morning., Disp: , Rfl:  .  LYRICA 150 MG capsule, , Disp: , Rfl: 0 .  metFORMIN (GLUCOPHAGE-XR) 500 MG 24 hr tablet, , Disp: , Rfl: 1 .  mirtazapine (REMERON) 45 MG tablet, Take 45 mg by mouth at bedtime., Disp: , Rfl:  .  Multiple  Vitamin (MULTIVITAMIN) capsule, Take 1 capsule by mouth daily., Disp: , Rfl:  .  naproxen sodium (ALEVE) 220 MG tablet, Take 440 mg by mouth daily as needed (FOR HEADACHE PAIN)., Disp: , Rfl:  .  omeprazole (PRILOSEC) 20 MG capsule, Take 40 mg by mouth every morning. , Disp: , Rfl:  .  phentermine 37.5 MG capsule, Take 37.5 mg by mouth every morning., Disp: , Rfl:  .  sertraline (ZOLOFT) 100 MG tablet, take 1/2 tablet once daily for 1 WEEK,THEN 1 TAB DAILY., Disp: , Rfl: 0 .  tiZANidine (ZANAFLEX) 2 MG tablet, take 1-2 tablets by mouth every 8 hours if needed, Disp: , Rfl: 0 .  topiramate (TOPAMAX) 25 MG tablet, Take 50 mg by mouth 2 (two) times daily. , Disp: , Rfl:  .  triamterene-hydrochlorothiazide (MAXZIDE-25) 37.5-25 MG per tablet, Take 1 tablet by mouth every morning., Disp: , Rfl:  .  Vilazodone HCl (VIIBRYD) 40 MG TABS, Take 40 mg by mouth daily after supper., Disp: , Rfl:      PHYSICAL EXAM SECTION: 1) BP 132/71   Pulse 76   Ht 5' 3.5" (1.613 m)   Wt (!) 318 lb (144.2 kg)   BMI 55.45 kg/m   Body mass index is 55.45 kg/m. General appearance: Well-developed well-nourished no gross deformities  2) Cardiovascular normal pulse and perfusion in the upper extremities 3) Neurologically deep tendon reflexes are equal and normal, no pathologic reflexes   4) Psychological: Awake alert and oriented x3 mood and affect normal   5) Skin no lacerations or ulcerations no nodularity no palpable masses, no erythema or nodularity   6) Musculoskeletal: Bilateral numbness and tingling both hands specifically the index long and ring finger with normal range of motion in both upper extremities including the hand weak grip strength positive Tinel's positive compression test positive Phalen's sign immediately.   Severe disease noted   Color normal capillary refill normal   MEDICAL DECISION SECTION:      Encounter Diagnoses  Name Primary?  . Bilateral carpal tunnel syndrome Yes  . Body mass  index 50.0-59.9, adult (HCC)    . Morbid obesity (HCC)     The patient meets the AMA guidelines for Morbid (severe) obesity with a BMI > 40.0 and I have recommended weight loss.   Imaging No imaging report read into the record   Plan:  (Rx., Inj., surg., Frx, MRI/CT, XR:2)   left carpal tunnel releases   The procedure has been fully reviewed with the patient; The risks and benefits of surgery have been discussed and explained and understood. Alternative treatment has also been reviewed, questions were encouraged and answered. The postoperative plan is also been reviewed.   Specific counseling was made regarding thyroid disease and diabetes a  compromise results and getting complete relief and obesity as well as counseling about 6 months of tenderness around the incision   9:57 AM Fuller CanadaStanley Jarmar Rousseau, MD

## 2018-08-20 ENCOUNTER — Encounter (HOSPITAL_COMMUNITY): Payer: Self-pay | Admitting: *Deleted

## 2018-08-20 ENCOUNTER — Ambulatory Visit (HOSPITAL_COMMUNITY): Payer: Medicaid Other | Admitting: Anesthesiology

## 2018-08-20 ENCOUNTER — Ambulatory Visit (HOSPITAL_COMMUNITY)
Admission: RE | Admit: 2018-08-20 | Discharge: 2018-08-20 | Disposition: A | Discharge status: Home / Self Care | Payer: Medicaid Other | Attending: Orthopedic Surgery | Admitting: Orthopedic Surgery

## 2018-08-20 ENCOUNTER — Encounter (HOSPITAL_COMMUNITY)
Admission: RE | Disposition: A | Discharge status: Home / Self Care | Payer: Self-pay | Source: Home / Self Care | Attending: Orthopedic Surgery

## 2018-08-20 ENCOUNTER — Other Ambulatory Visit: Payer: Self-pay

## 2018-08-20 DIAGNOSIS — Z793 Long term (current) use of hormonal contraceptives: Secondary | ICD-10-CM | POA: Diagnosis not present

## 2018-08-20 DIAGNOSIS — I10 Essential (primary) hypertension: Secondary | ICD-10-CM | POA: Insufficient documentation

## 2018-08-20 DIAGNOSIS — M549 Dorsalgia, unspecified: Secondary | ICD-10-CM | POA: Insufficient documentation

## 2018-08-20 DIAGNOSIS — K219 Gastro-esophageal reflux disease without esophagitis: Secondary | ICD-10-CM | POA: Insufficient documentation

## 2018-08-20 DIAGNOSIS — G5603 Carpal tunnel syndrome, bilateral upper limbs: Secondary | ICD-10-CM | POA: Diagnosis present

## 2018-08-20 DIAGNOSIS — Z791 Long term (current) use of non-steroidal anti-inflammatories (NSAID): Secondary | ICD-10-CM | POA: Diagnosis not present

## 2018-08-20 DIAGNOSIS — H9190 Unspecified hearing loss, unspecified ear: Secondary | ICD-10-CM | POA: Diagnosis not present

## 2018-08-20 DIAGNOSIS — G4700 Insomnia, unspecified: Secondary | ICD-10-CM | POA: Diagnosis not present

## 2018-08-20 DIAGNOSIS — Z6841 Body Mass Index (BMI) 40.0 and over, adult: Secondary | ICD-10-CM | POA: Insufficient documentation

## 2018-08-20 DIAGNOSIS — K5900 Constipation, unspecified: Secondary | ICD-10-CM | POA: Insufficient documentation

## 2018-08-20 DIAGNOSIS — G43909 Migraine, unspecified, not intractable, without status migrainosus: Secondary | ICD-10-CM | POA: Insufficient documentation

## 2018-08-20 DIAGNOSIS — Z79899 Other long term (current) drug therapy: Secondary | ICD-10-CM | POA: Insufficient documentation

## 2018-08-20 DIAGNOSIS — E1140 Type 2 diabetes mellitus with diabetic neuropathy, unspecified: Secondary | ICD-10-CM | POA: Insufficient documentation

## 2018-08-20 DIAGNOSIS — F329 Major depressive disorder, single episode, unspecified: Secondary | ICD-10-CM | POA: Diagnosis not present

## 2018-08-20 DIAGNOSIS — F419 Anxiety disorder, unspecified: Secondary | ICD-10-CM | POA: Insufficient documentation

## 2018-08-20 DIAGNOSIS — Z7984 Long term (current) use of oral hypoglycemic drugs: Secondary | ICD-10-CM | POA: Insufficient documentation

## 2018-08-20 DIAGNOSIS — H9319 Tinnitus, unspecified ear: Secondary | ICD-10-CM | POA: Diagnosis not present

## 2018-08-20 DIAGNOSIS — G894 Chronic pain syndrome: Secondary | ICD-10-CM | POA: Insufficient documentation

## 2018-08-20 DIAGNOSIS — G5602 Carpal tunnel syndrome, left upper limb: Secondary | ICD-10-CM | POA: Diagnosis not present

## 2018-08-20 HISTORY — PX: CARPAL TUNNEL RELEASE: SHX101

## 2018-08-20 LAB — GLUCOSE, CAPILLARY: Glucose-Capillary: 146 mg/dL — ABNORMAL HIGH (ref 70–99)

## 2018-08-20 SURGERY — CARPAL TUNNEL RELEASE
Anesthesia: Monitor Anesthesia Care | Laterality: Left

## 2018-08-20 MED ORDER — HYDROCODONE-ACETAMINOPHEN 5-325 MG PO TABS
ORAL_TABLET | ORAL | Status: AC
  Filled 2018-08-20: qty 1

## 2018-08-20 MED ORDER — CEFAZOLIN SODIUM-DEXTROSE 2-4 GM/100ML-% IV SOLN
INTRAVENOUS | Status: AC
  Filled 2018-08-20: qty 100

## 2018-08-20 MED ORDER — CHLORHEXIDINE GLUCONATE 4 % EX LIQD: 60.0000 mL | Freq: Once | CUTANEOUS | Status: DC

## 2018-08-20 MED ORDER — LIDOCAINE HCL (PF) 0.5 % IJ SOLN: INTRAMUSCULAR | Status: DC | PRN

## 2018-08-20 MED ORDER — PROPOFOL 500 MG/50ML IV EMUL
INTRAVENOUS | Status: DC | PRN
  Administered 2018-08-20: 30 ug/kg/min via INTRAVENOUS

## 2018-08-20 MED ORDER — MIDAZOLAM HCL 2 MG/2ML IJ SOLN: 0.5000 mg | Freq: Once | INTRAMUSCULAR | Status: DC | PRN

## 2018-08-20 MED ORDER — MIDAZOLAM HCL 2 MG/2ML IJ SOLN
INTRAMUSCULAR | Status: AC
  Filled 2018-08-20: qty 2

## 2018-08-20 MED ORDER — BUPIVACAINE HCL (PF) 0.5 % IJ SOLN
INTRAMUSCULAR | Status: DC | PRN
  Administered 2018-08-20: 10 mL

## 2018-08-20 MED ORDER — FENTANYL CITRATE (PF) 100 MCG/2ML IJ SOLN
INTRAMUSCULAR | Status: DC | PRN
  Administered 2018-08-20: 100 ug via INTRAVENOUS

## 2018-08-20 MED ORDER — LACTATED RINGERS IV SOLN
INTRAVENOUS | Status: DC
  Administered 2018-08-20: 1000 mL via INTRAVENOUS

## 2018-08-20 MED ORDER — HYDROMORPHONE HCL 1 MG/ML IJ SOLN
0.2500 mg | INTRAMUSCULAR | Status: DC | PRN
  Administered 2018-08-20: 0.5 mg via INTRAVENOUS
  Filled 2018-08-20: qty 0.5

## 2018-08-20 MED ORDER — HYDROCODONE-ACETAMINOPHEN 7.5-325 MG PO TABS: 1.0000 | ORAL_TABLET | ORAL | 0 refills | Status: DC | PRN

## 2018-08-20 MED ORDER — LIDOCAINE HCL (PF) 0.5 % IJ SOLN
INTRAMUSCULAR | Status: DC | PRN
  Administered 2018-08-20: 45 mL via INTRAVENOUS

## 2018-08-20 MED ORDER — HYDROCODONE-ACETAMINOPHEN 7.5-325 MG PO TABS: 1.0000 | ORAL_TABLET | Freq: Once | ORAL | Status: DC | PRN

## 2018-08-20 MED ORDER — FENTANYL CITRATE (PF) 100 MCG/2ML IJ SOLN
INTRAMUSCULAR | Status: AC
  Filled 2018-08-20: qty 2

## 2018-08-20 MED ORDER — DEXAMETHASONE SODIUM PHOSPHATE 10 MG/ML IJ SOLN
INTRAMUSCULAR | Status: AC
  Filled 2018-08-20: qty 1

## 2018-08-20 MED ORDER — 0.9 % SODIUM CHLORIDE (POUR BTL) OPTIME
TOPICAL | Status: DC | PRN
  Administered 2018-08-20: 1000 mL

## 2018-08-20 MED ORDER — LIDOCAINE HCL (PF) 0.5 % IJ SOLN
INTRAMUSCULAR | Status: AC
  Filled 2018-08-20: qty 50

## 2018-08-20 MED ORDER — HYDROCODONE-ACETAMINOPHEN 5-325 MG PO TABS
1.0000 | ORAL_TABLET | Freq: Once | ORAL | Status: AC
  Administered 2018-08-20: 1 via ORAL

## 2018-08-20 MED ORDER — CEFAZOLIN SODIUM-DEXTROSE 1-4 GM/50ML-% IV SOLN
INTRAVENOUS | Status: AC
  Filled 2018-08-20: qty 50

## 2018-08-20 MED ORDER — BUPIVACAINE HCL (PF) 0.5 % IJ SOLN
INTRAMUSCULAR | Status: AC
  Filled 2018-08-20: qty 30

## 2018-08-20 MED ORDER — PROPOFOL 10 MG/ML IV BOLUS
INTRAVENOUS | Status: AC
  Filled 2018-08-20: qty 40

## 2018-08-20 MED ORDER — ONDANSETRON HCL 4 MG/2ML IJ SOLN
INTRAMUSCULAR | Status: DC | PRN
  Administered 2018-08-20: 4 mg via INTRAVENOUS

## 2018-08-20 MED ORDER — PROMETHAZINE HCL 25 MG/ML IJ SOLN: 6.2500 mg | INTRAMUSCULAR | Status: DC | PRN

## 2018-08-20 MED ORDER — MIDAZOLAM HCL 5 MG/5ML IJ SOLN
INTRAMUSCULAR | Status: DC | PRN
  Administered 2018-08-20: 2 mg via INTRAVENOUS

## 2018-08-20 MED ORDER — DEXTROSE 5 % IV SOLN
3.0000 g | INTRAVENOUS | Status: AC
  Administered 2018-08-20: 3 g via INTRAVENOUS
  Filled 2018-08-20: qty 3000

## 2018-08-20 SURGICAL SUPPLY — 48 items
APL PRP STRL LF DISP 70% ISPRP (MISCELLANEOUS) ×1
BANDAGE ELASTIC 3 LF NS (GAUZE/BANDAGES/DRESSINGS) ×2 IMPLANT
BANDAGE ESMARK 4X12 BL STRL LF (DISPOSABLE) ×1 IMPLANT
BLADE SURG 15 STRL LF DISP TIS (BLADE) ×1 IMPLANT
BLADE SURG 15 STRL SS (BLADE) ×3
BNDG CMPR 12X4 ELC STRL LF (DISPOSABLE) ×1
BNDG CMPR MED 5X3 ELC HKLP NS (GAUZE/BANDAGES/DRESSINGS) ×1
BNDG CMPR STD VLCR NS LF 5.8X3 (GAUZE/BANDAGES/DRESSINGS) ×1
BNDG COHESIVE 4X5 TAN STRL (GAUZE/BANDAGES/DRESSINGS) ×3 IMPLANT
BNDG ELASTIC 3X5.8 VLCR NS LF (GAUZE/BANDAGES/DRESSINGS) ×3 IMPLANT
BNDG ESMARK 4X12 BLUE STRL LF (DISPOSABLE) ×3
BNDG GAUZE ELAST 4 BULKY (GAUZE/BANDAGES/DRESSINGS) ×3 IMPLANT
CHLORAPREP W/TINT 26 (MISCELLANEOUS) ×3 IMPLANT
CLOTH BEACON ORANGE TIMEOUT ST (SAFETY) ×3 IMPLANT
COVER LIGHT HANDLE STERIS (MISCELLANEOUS) ×6 IMPLANT
COVER WAND RF STERILE (DRAPES) ×3 IMPLANT
CUFF TOURN SGL QUICK 24 (TOURNIQUET CUFF) ×3
CUFF TRNQT CYL 24X4X16.5-23 (TOURNIQUET CUFF) ×1 IMPLANT
DECANTER SPIKE VIAL GLASS SM (MISCELLANEOUS) ×3 IMPLANT
DRAPE HALF SHEET 40X57 (DRAPES) ×2 IMPLANT
DRAPE PROXIMA HALF (DRAPES) ×3 IMPLANT
DRSG XEROFORM 1X8 (GAUZE/BANDAGES/DRESSINGS) ×3 IMPLANT
ELECT NDL TIP 2.8 STRL (NEEDLE) ×1 IMPLANT
ELECT NEEDLE TIP 2.8 STRL (NEEDLE) ×3 IMPLANT
ELECT REM PT RETURN 9FT ADLT (ELECTROSURGICAL) ×3
ELECTRODE REM PT RTRN 9FT ADLT (ELECTROSURGICAL) ×1 IMPLANT
GAUZE SPONGE 4X4 12PLY STRL (GAUZE/BANDAGES/DRESSINGS) ×3 IMPLANT
GLOVE BIO SURGEON STRL SZ7 (GLOVE) ×2 IMPLANT
GLOVE BIOGEL PI IND STRL 7.0 (GLOVE) ×1 IMPLANT
GLOVE BIOGEL PI INDICATOR 7.0 (GLOVE) ×4
GLOVE SKINSENSE NS SZ8.0 LF (GLOVE) ×2
GLOVE SKINSENSE STRL SZ8.0 LF (GLOVE) ×1 IMPLANT
GLOVE SS N UNI LF 8.5 STRL (GLOVE) ×3 IMPLANT
GOWN STRL REUS W/ TWL LRG LVL3 (GOWN DISPOSABLE) ×1 IMPLANT
GOWN STRL REUS W/TWL LRG LVL3 (GOWN DISPOSABLE) ×3
GOWN STRL REUS W/TWL XL LVL3 (GOWN DISPOSABLE) ×3 IMPLANT
KIT TURNOVER KIT A (KITS) ×3 IMPLANT
MANIFOLD NEPTUNE II (INSTRUMENTS) ×3 IMPLANT
NDL HYPO 21X1.5 SAFETY (NEEDLE) ×1 IMPLANT
NEEDLE HYPO 21X1.5 SAFETY (NEEDLE) ×3 IMPLANT
NS IRRIG 1000ML POUR BTL (IV SOLUTION) ×3 IMPLANT
PACK BASIC LIMB (CUSTOM PROCEDURE TRAY) ×3 IMPLANT
PAD ARMBOARD 7.5X6 YLW CONV (MISCELLANEOUS) ×3 IMPLANT
PADDING WEBRIL 4 STERILE (GAUZE/BANDAGES/DRESSINGS) ×2 IMPLANT
POSITIONER HAND ALUMI XLG (MISCELLANEOUS) ×3 IMPLANT
SET BASIN LINEN APH (SET/KITS/TRAYS/PACK) ×3 IMPLANT
SUT ETHILON 3 0 FSL (SUTURE) ×3 IMPLANT
SYR CONTROL 10ML LL (SYRINGE) ×3 IMPLANT

## 2018-08-20 NOTE — Op Note (Signed)
08/20/2018  8:17 AM  PATIENT:  Isabella Walker  37 y.o. female  PRE-OPERATIVE DIAGNOSIS:  left carpal tunnel syndrome  POST-OPERATIVE DIAGNOSIS:  left carpal tunnel syndrome  PROCEDURE:  Procedure(s): CARPAL TUNNEL RELEASE (Left) - 27062  SURGEON:  Surgeon(s) and Role:    Carole Civil, MD - Primary  Carpal tunnel release left wrist  Preop diagnosis carpal tunnel syndrome left wrist   postop diagnosis same  Procedure open carpal tunnel release left wrist Surgeon Aline Brochure  Anesthesia regional Bier block  Operative findings compression of the left median nerve, color dark shape apple core without any anterior carpal tunnel masses   Indications failure of conservative treatment to relieve pain and paresthesias and numbness and tingling of the left hand  The patient was identified in the preop area we confirm the surgical site marked as left wrist. Chart update completed. Patient taken to surgery. She had 2 g of Ancef. After establishing a Bier block left her arm was prepped with ChloraPrep.  Timeout executed completed and confirmed site.  A straight incision was made over the left carpal tunnel in line with the radial border of the ring finger. Blunt dissection was carried out to find the distal aspect of the carpal tunnel. A blunted instrument was passed beneath the carpal tunnel. Sharp incision was then used to release the transverse carpal ligament. The contents of the carpal tunnel were inspected. The median nerve was compressed with discoloration.  The wound was irrigated and then closed with 3-0 nylon suture. We injected 10 mL of plain Marcaine on the radial side of the incision  A sterile bandage was applied and the tourniquet was released the color of the hand and capillary refill were normal  The patient was taken to the recovery room in stable condition  PHYSICIAN ASSISTANT:   ASSISTANTS: none   ANESTHESIA:   regional  EBL:  none   BLOOD  ADMINISTERED:none  DRAINS: none   LOCAL MEDICATIONS USED:  MARCAINE     SPECIMEN:  No Specimen  DISPOSITION OF SPECIMEN:  N/A  COUNTS:  YES  TOURNIQUET:   Total Tourniquet Time Documented: Upper Arm (Left) - 35 minutes Total: Upper Arm (Left) - 35 minutes   DICTATION: .Viviann Spare Dictation  PLAN OF CARE: Discharge to home after PACU  PATIENT DISPOSITION:  PACU - hemodynamically stable.   Delay start of Pharmacological VTE agent (>24hrs) due to surgical blood loss or risk of bleeding: no

## 2018-08-20 NOTE — Interval H&P Note (Signed)
History and Physical Interval Note:  08/20/2018 7:24 AM  Isabella Walker  has presented today for surgery, with the diagnosis of left carpal tunnel syndrome.  The various methods of treatment have been discussed with the patient and family. After consideration of risks, benefits and other options for treatment, the patient has consented to  Procedure(s): CARPAL TUNNEL RELEASE (Left) as a surgical intervention.  The patient's history has been reviewed, patient examined, no change in status, stable for surgery.  I have reviewed the patient's chart and labs.  Questions were answered to the patient's satisfaction.     Arther Abbott

## 2018-08-20 NOTE — Brief Op Note (Signed)
08/20/2018  8:17 AM  PATIENT:  Isabella Walker  37 y.o. female  PRE-OPERATIVE DIAGNOSIS:  left carpal tunnel syndrome  POST-OPERATIVE DIAGNOSIS:  left carpal tunnel syndrome  PROCEDURE:  Procedure(s): CARPAL TUNNEL RELEASE (Left)  SURGEON:  Surgeon(s) and Role:    Carole Civil, MD - Primary  PHYSICIAN ASSISTANT:   ASSISTANTS: none   ANESTHESIA:   regional  EBL:  none   BLOOD ADMINISTERED:none  DRAINS: none   LOCAL MEDICATIONS USED:  MARCAINE     SPECIMEN:  No Specimen  DISPOSITION OF SPECIMEN:  N/A  COUNTS:  YES  TOURNIQUET:   Total Tourniquet Time Documented: Upper Arm (Left) - 35 minutes Total: Upper Arm (Left) - 35 minutes   DICTATION: .Viviann Spare Dictation  PLAN OF CARE: Discharge to home after PACU  PATIENT DISPOSITION:  PACU - hemodynamically stable.   Delay start of Pharmacological VTE agent (>24hrs) due to surgical blood loss or risk of bleeding: no

## 2018-08-20 NOTE — Transfer of Care (Signed)
Immediate Anesthesia Transfer of Care Note  Patient: Isabella Walker  Procedure(s) Performed: CARPAL TUNNEL RELEASE (Left )  Patient Location: PACU  Anesthesia Type:Bier block  Level of Consciousness: awake, alert  and oriented  Airway & Oxygen Therapy: Patient Spontanous Breathing  Post-op Assessment: Report given to RN and Post -op Vital signs reviewed and stable  Post vital signs: Reviewed and stable  Last Vitals:  Vitals Value Taken Time  BP    Temp    Pulse    Resp    SpO2      Last Pain:  Vitals:   08/20/18 0649  TempSrc: Oral  PainSc: 9       Patients Stated Pain Goal: 9 (33/35/45 6256)  Complications: No apparent anesthesia complications

## 2018-08-20 NOTE — Anesthesia Postprocedure Evaluation (Signed)
Anesthesia Post Note  Patient: Isabella Walker  Procedure(s) Performed: CARPAL TUNNEL RELEASE (Left )  Patient location during evaluation: PACU Anesthesia Type: MAC and Bier Block Level of consciousness: awake and alert, patient cooperative and oriented Pain management: pain level controlled Vital Signs Assessment: post-procedure vital signs reviewed and stable Respiratory status: spontaneous breathing, nonlabored ventilation and respiratory function stable Cardiovascular status: stable Postop Assessment: no apparent nausea or vomiting Anesthetic complications: no     Last Vitals:  Vitals:   08/20/18 0649  BP: (!) 162/89  Pulse: 77  Resp: 20  Temp: 37 C  SpO2: 98%    Last Pain:  Vitals:   08/20/18 0649  TempSrc: Oral  PainSc: 9                  Brown Dunlap

## 2018-08-20 NOTE — Anesthesia Preprocedure Evaluation (Signed)
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 (+) Edentulous Upper, Edentulous Lower   Pulmonary neg pulmonary ROS, Current SmokerPatient did not abstain from smoking.,    Pulmonary exam normal breath sounds clear to auscultation       Cardiovascular Exercise Tolerance: Poor hypertension, Pt. on medications negative cardio ROS Normal cardiovascular examII Rhythm:Regular Rate:Normal     Neuro/Psych  Headaches, Anxiety Depression negative psych ROS   GI/Hepatic Neg liver ROS, GERD  Medicated and Controlled,  Endo/Other  Morbid obesityBMI >54  SMO   Renal/GU negative Renal ROS  negative genitourinary   Musculoskeletal negative musculoskeletal ROS (+)   Abdominal   Peds negative pediatric ROS (+)  Hematology negative hematology ROS (+)   Anesthesia Other Findings   Reproductive/Obstetrics negative OB ROS                             Anesthesia Physical Anesthesia Plan  ASA: IV  Anesthesia Plan: Bier Block and MAC and Bier Block-LIDOCAINE ONLY   Post-op Pain Management:    Induction: Intravenous  PONV Risk Score and Plan: 1 and Ondansetron, Treatment may vary due to age or medical condition and TIVA  Airway Management Planned: Nasal Cannula and Simple Face Mask  Additional Equipment:   Intra-op Plan:   Post-operative Plan:   Informed Consent: I have reviewed the patients History and Physical, chart, labs and discussed the procedure including the risks, benefits and alternatives for the proposed  anesthesia with the patient or authorized representative who has indicated his/her understanding and acceptance.     Dental advisory given  Plan Discussed with: CRNA  Anesthesia Plan Comments: (Plan Full PPE use  Plan Bier /MAC-WTP with same after Q&A  D/w pt GETA as needed -VU-WTP )        Anesthesia Quick Evaluation

## 2018-08-20 NOTE — Discharge Instructions (Signed)
Monitored Anesthesia Care, Care After °These instructions provide you with information about caring for yourself after your procedure. Your health care provider may also give you more specific instructions. Your treatment has been planned according to current medical practices, but problems sometimes occur. Call your health care provider if you have any problems or questions after your procedure. °What can I expect after the procedure? °After your procedure, you may: °· Feel sleepy for several hours. °· Feel clumsy and have poor balance for several hours. °· Feel forgetful about what happened after the procedure. °· Have poor judgment for several hours. °· Feel nauseous or vomit. °· Have a sore throat if you had a breathing tube during the procedure. °Follow these instructions at home: °For at least 24 hours after the procedure: ° °  ° °· Have a responsible adult stay with you. It is important to have someone help care for you until you are awake and alert. °· Rest as needed. °· Do not: °? Participate in activities in which you could fall or become injured. °? Drive. °? Use heavy machinery. °? Drink alcohol. °? Take sleeping pills or medicines that cause drowsiness. °? Make important decisions or sign legal documents. °? Take care of children on your own. °Eating and drinking °· Follow the diet that is recommended by your health care provider. °· If you vomit, drink water, juice, or soup when you can drink without vomiting. °· Make sure you have little or no nausea before eating solid foods. °General instructions °· Take over-the-counter and prescription medicines only as told by your health care provider. °· If you have sleep apnea, surgery and certain medicines can increase your risk for breathing problems. Follow instructions from your health care provider about wearing your sleep device: °? Anytime you are sleeping, including during daytime naps. °? While taking prescription pain medicines, sleeping medicines,  or medicines that make you drowsy. °· If you smoke, do not smoke without supervision. °· Keep all follow-up visits as told by your health care provider. This is important. °Contact a health care provider if: °· You keep feeling nauseous or you keep vomiting. °· You feel light-headed. °· You develop a rash. °· You have a fever. °Get help right away if: °· You have trouble breathing. °Summary °· For several hours after your procedure, you may feel sleepy and have poor judgment. °· Have a responsible adult stay with you for at least 24 hours or until you are awake and alert. °This information is not intended to replace advice given to you by your health care provider. Make sure you discuss any questions you have with your health care provider. °Document Released: 04/16/2015 Document Revised: 03/24/2017 Document Reviewed: 04/16/2015 °Elsevier Patient Education © 2020 [employer] ° °

## 2018-08-25 ENCOUNTER — Encounter (HOSPITAL_COMMUNITY): Payer: Self-pay | Admitting: Orthopedic Surgery

## 2018-08-27 ENCOUNTER — Ambulatory Visit: Payer: Medicaid Other | Admitting: Orthopaedic Surgery

## 2018-08-27 ENCOUNTER — Other Ambulatory Visit: Payer: Self-pay

## 2018-08-27 ENCOUNTER — Ambulatory Visit (INDEPENDENT_AMBULATORY_CARE_PROVIDER_SITE_OTHER): Payer: Medicaid Other | Admitting: Orthopedic Surgery

## 2018-08-27 VITALS — BP 89/68 | HR 65 | Temp 97.0°F | Ht 63.0 in | Wt 318.0 lb

## 2018-08-27 DIAGNOSIS — Z9889 Other specified postprocedural states: Secondary | ICD-10-CM

## 2018-08-27 NOTE — Progress Notes (Signed)
Chief Complaint  Patient presents with  . Follow-up    Recheck on left CTR, DOS 08-20-18.   Postop visit #1 left carpal tunnel release she is postop day 7 comes in for dressing change  She has numbness at the tips of her fingers and her fingers are very stiff  I will take her stitches out next Friday by I encouraged her to move the fingers more frequently  Currently on chronic hydrocodone 7.5 mg  Stitches out next fri    Encounter Diagnosis  Name Primary?  . S/P carpal tunnel release-left August 20, 2018 Yes

## 2018-08-31 ENCOUNTER — Ambulatory Visit: Payer: Medicaid Other | Admitting: Orthopedic Surgery

## 2018-09-04 ENCOUNTER — Ambulatory Visit (INDEPENDENT_AMBULATORY_CARE_PROVIDER_SITE_OTHER): Payer: Medicaid Other | Admitting: Orthopedic Surgery

## 2018-09-04 ENCOUNTER — Other Ambulatory Visit: Payer: Self-pay

## 2018-09-04 ENCOUNTER — Encounter: Payer: Self-pay | Admitting: Orthopedic Surgery

## 2018-09-04 VITALS — BP 136/70 | HR 79

## 2018-09-04 DIAGNOSIS — Z9889 Other specified postprocedural states: Secondary | ICD-10-CM

## 2018-09-04 NOTE — Progress Notes (Signed)
Chief Complaint  Patient presents with  . Follow-up    Recheck on CTR, DOS 08-20-18.   Left carpal tunnel release   15 days post op,sutures removed. Patient has no redness, drainage, or irritation. Tolerated suture removal well. I advised patient she may now shower but not to submerge hand in water or soak. Use soap and water only on incision. She states she has less pain and tingling, it does not wake her up at night any more. She is requesting refill on Hydrocodone.   I had to go to the surgery as an emergency  The patient wants a refill for hydrocodone but that is not necessary for carpal tunnel release.  It looks like she may have been on some before and will have to get that from her proceeding prescriber Dr. Merlene Laughter  Tylenol and Advil are enough for pain at this point  Encounter Diagnosis  Name Primary?  . S/P carpal tunnel release Yes

## 2018-09-08 ENCOUNTER — Telehealth: Payer: Self-pay | Admitting: Orthopedic Surgery

## 2018-09-08 NOTE — Telephone Encounter (Signed)
Was 3 weeks ago, I faxed

## 2018-09-08 NOTE — Telephone Encounter (Signed)
Call just received from patient's primary care provider [facility], per Alyssa; relays that provider has ordered a covid-19 test for patient for today, 09/08/18, 1:00pm. States patient relayed to them that she had a recent covid test, prior to surgery. Their office is asking for results.  Ph# [phone] Fax# [phone].  Please advise.

## 2018-09-10 DIAGNOSIS — Z9889 Other specified postprocedural states: Secondary | ICD-10-CM | POA: Insufficient documentation

## 2018-09-11 ENCOUNTER — Ambulatory Visit: Payer: Medicaid Other | Admitting: Orthopedic Surgery

## 2018-09-18 ENCOUNTER — Other Ambulatory Visit: Payer: Self-pay

## 2018-09-18 ENCOUNTER — Ambulatory Visit (INDEPENDENT_AMBULATORY_CARE_PROVIDER_SITE_OTHER): Payer: Medicaid Other | Admitting: Orthopedic Surgery

## 2018-09-18 ENCOUNTER — Encounter: Payer: Self-pay | Admitting: Orthopedic Surgery

## 2018-09-18 DIAGNOSIS — Z9889 Other specified postprocedural states: Secondary | ICD-10-CM

## 2018-09-18 NOTE — Progress Notes (Signed)
Chief Complaint  Patient presents with  . Post-op Follow-up    08/20/18 left carpal tunnel release, still has some pain    Isabella Walker is progressing well she has very little tingling at the tips of the fingers improving her grip strength is improving and her range of motion is improving although not completely normal yet  We recommend continued active range of motion exercises strengthening exercises and a follow-up should be done in 4 weeks  Today's examination revealed a well-healed wound she could slowly get her fingers all the way closed and straightened Encounter Diagnosis  Name Primary?  . S/P carpal tunnel release 08/20/2018 Yes   .

## 2018-09-18 NOTE — Patient Instructions (Signed)
WORK ON GRIP STRENGTH AND THE BENDING

## 2018-09-18 NOTE — Progress Notes (Signed)
Post op

## 2018-10-16 ENCOUNTER — Ambulatory Visit: Payer: Medicaid Other | Admitting: Orthopedic Surgery

## 2018-10-19 ENCOUNTER — Encounter: Payer: Self-pay | Admitting: Orthopedic Surgery

## 2018-10-19 ENCOUNTER — Ambulatory Visit: Payer: Medicaid Other | Admitting: Orthopedic Surgery

## 2018-10-19 ENCOUNTER — Telehealth: Payer: Self-pay | Admitting: Orthopedic Surgery

## 2018-10-19 NOTE — Telephone Encounter (Signed)
Called back to patient regarding re-schedule (as per Dr Aline Brochure - needs in-person visit); left message on voice mail.

## 2018-10-19 NOTE — Telephone Encounter (Signed)
Also sent patient a letter.

## 2018-10-19 NOTE — Telephone Encounter (Signed)
Patient has had to cancel her follow up/wound check appointment for the second time due to no transportation. Said she will call back as soon as she can make arrangements. Or can do a telephone visit if that is an option. Please advise. Ph [phone] *   * Per Dr Aline Brochure, needs in-person visit.

## 2018-10-21 ENCOUNTER — Other Ambulatory Visit: Payer: Self-pay

## 2018-10-21 ENCOUNTER — Encounter: Payer: Self-pay | Admitting: Orthopedic Surgery

## 2018-10-21 ENCOUNTER — Ambulatory Visit (INDEPENDENT_AMBULATORY_CARE_PROVIDER_SITE_OTHER): Payer: Medicaid Other | Admitting: Orthopedic Surgery

## 2018-10-21 DIAGNOSIS — Z9889 Other specified postprocedural states: Secondary | ICD-10-CM

## 2018-10-21 NOTE — Progress Notes (Signed)
Chief Complaint  Patient presents with  . Routine Post Op    08/20/2018 left carpal tunnel release    Asheley is doing better she has pain and tenderness over the thenar eminence but all of her carpal tunnel symptoms are resolved her wound looks clean she has full range of motion of the hand and normal sensation  Recommend ibuprofen and BenGay as needed for the thenar eminence pain which is just off the incision and related to surgical dissection  Follow-up as needed  Encounter Diagnosis  Name Primary?  . S/P carpal tunnel release 08/20/2018 Yes

## 2018-10-21 NOTE — Patient Instructions (Signed)
Ben gay and ibuprofen for pain

## 2018-10-27 ENCOUNTER — Other Ambulatory Visit: Payer: Self-pay | Admitting: Neurology

## 2018-10-27 ENCOUNTER — Other Ambulatory Visit (HOSPITAL_COMMUNITY): Payer: Self-pay | Admitting: Neurology

## 2018-10-27 DIAGNOSIS — M4714 Other spondylosis with myelopathy, thoracic region: Secondary | ICD-10-CM

## 2018-11-04 ENCOUNTER — Other Ambulatory Visit: Payer: Self-pay

## 2018-11-04 ENCOUNTER — Ambulatory Visit (HOSPITAL_COMMUNITY)
Admission: RE | Admit: 2018-11-04 | Discharge: 2018-11-04 | Disposition: A | Discharge status: Home / Self Care | Payer: Medicaid Other | Source: Ambulatory Visit | Attending: Neurology | Admitting: Neurology

## 2018-11-04 ENCOUNTER — Encounter (HOSPITAL_COMMUNITY): Payer: Self-pay

## 2018-11-04 DIAGNOSIS — M4714 Other spondylosis with myelopathy, thoracic region: Secondary | ICD-10-CM

## 2018-11-10 ENCOUNTER — Other Ambulatory Visit (HOSPITAL_COMMUNITY): Payer: Self-pay | Admitting: Neurology

## 2018-11-10 DIAGNOSIS — M4714 Other spondylosis with myelopathy, thoracic region: Secondary | ICD-10-CM

## 2018-11-20 ENCOUNTER — Ambulatory Visit (HOSPITAL_COMMUNITY): Admission: RE | Admit: 2018-11-20 | Payer: Medicaid Other | Source: Ambulatory Visit

## 2019-01-04 ENCOUNTER — Other Ambulatory Visit: Payer: Self-pay

## 2019-01-04 ENCOUNTER — Ambulatory Visit: Payer: Medicaid Other | Attending: Internal Medicine

## 2019-01-04 DIAGNOSIS — Z20822 Contact with and (suspected) exposure to covid-19: Secondary | ICD-10-CM

## 2019-01-06 LAB — NOVEL CORONAVIRUS, NAA: SARS-CoV-2, NAA: NOT DETECTED

## 2019-01-22 ENCOUNTER — Ambulatory Visit (HOSPITAL_COMMUNITY)
Admission: RE | Admit: 2019-01-22 | Discharge: 2019-01-22 | Disposition: A | Discharge status: Home / Self Care | Payer: Medicaid Other | Source: Ambulatory Visit | Attending: Neurology | Admitting: Neurology

## 2019-01-22 ENCOUNTER — Other Ambulatory Visit: Payer: Self-pay

## 2019-01-22 DIAGNOSIS — M4714 Other spondylosis with myelopathy, thoracic region: Secondary | ICD-10-CM | POA: Diagnosis not present

## 2019-07-20 ENCOUNTER — Other Ambulatory Visit (HOSPITAL_COMMUNITY): Payer: Self-pay | Admitting: Family Medicine

## 2019-07-20 DIAGNOSIS — Z1231 Encounter for screening mammogram for malignant neoplasm of breast: Secondary | ICD-10-CM

## 2019-08-05 ENCOUNTER — Ambulatory Visit (HOSPITAL_COMMUNITY): Payer: Medicaid Other

## 2019-08-11 ENCOUNTER — Ambulatory Visit (HOSPITAL_COMMUNITY): Payer: Medicaid Other

## 2019-08-16 ENCOUNTER — Other Ambulatory Visit: Payer: Self-pay

## 2019-08-16 ENCOUNTER — Ambulatory Visit (HOSPITAL_COMMUNITY)
Admission: RE | Admit: 2019-08-16 | Discharge: 2019-08-16 | Disposition: A | Discharge status: Home / Self Care | Payer: Medicaid Other | Source: Ambulatory Visit | Attending: Family Medicine | Admitting: Family Medicine

## 2019-08-16 ENCOUNTER — Other Ambulatory Visit (HOSPITAL_COMMUNITY): Payer: Self-pay | Admitting: Family Medicine

## 2019-08-16 DIAGNOSIS — M79672 Pain in left foot: Secondary | ICD-10-CM | POA: Diagnosis present

## 2019-08-16 DIAGNOSIS — R296 Repeated falls: Secondary | ICD-10-CM

## 2019-08-16 DIAGNOSIS — M79671 Pain in right foot: Secondary | ICD-10-CM | POA: Insufficient documentation

## 2019-08-18 DIAGNOSIS — F419 Anxiety disorder, unspecified: Secondary | ICD-10-CM | POA: Insufficient documentation

## 2019-08-18 DIAGNOSIS — R42 Dizziness and giddiness: Secondary | ICD-10-CM | POA: Insufficient documentation

## 2019-08-18 DIAGNOSIS — R531 Weakness: Secondary | ICD-10-CM | POA: Insufficient documentation

## 2019-08-18 DIAGNOSIS — Z5321 Procedure and treatment not carried out due to patient leaving prior to being seen by health care provider: Secondary | ICD-10-CM | POA: Insufficient documentation

## 2019-08-19 ENCOUNTER — Emergency Department (HOSPITAL_COMMUNITY)
Admission: EM | Admit: 2019-08-19 | Discharge: 2019-08-19 | Disposition: A | Discharge status: Home / Self Care | Payer: Medicaid Other | Attending: Emergency Medicine | Admitting: Emergency Medicine

## 2019-08-19 ENCOUNTER — Other Ambulatory Visit: Payer: Self-pay

## 2019-08-19 ENCOUNTER — Encounter (HOSPITAL_COMMUNITY): Payer: Self-pay | Admitting: Emergency Medicine

## 2019-08-19 LAB — CBC
HCT: 40.5 % (ref 36.0–46.0)
Hemoglobin: 12.3 g/dL (ref 12.0–15.0)
MCH: 31.5 pg (ref 26.0–34.0)
MCHC: 30.4 g/dL (ref 30.0–36.0)
MCV: 103.8 fL — ABNORMAL HIGH (ref 80.0–100.0)
Platelets: 269 10*3/uL (ref 150–400)
RBC: 3.9 MIL/uL (ref 3.87–5.11)
RDW: 13.3 % (ref 11.5–15.5)
WBC: 8.6 10*3/uL (ref 4.0–10.5)
nRBC: 0 % (ref 0.0–0.2)

## 2019-08-19 LAB — COMPREHENSIVE METABOLIC PANEL
ALT: 17 U/L (ref 0–44)
AST: 18 U/L (ref 15–41)
Albumin: 3.9 g/dL (ref 3.5–5.0)
Alkaline Phosphatase: 52 U/L (ref 38–126)
Anion gap: 8 (ref 5–15)
BUN: 13 mg/dL (ref 6–20)
CO2: 26 mmol/L (ref 22–32)
Calcium: 8.6 mg/dL — ABNORMAL LOW (ref 8.9–10.3)
Chloride: 105 mmol/L (ref 98–111)
Creatinine, Ser: 0.75 mg/dL (ref 0.44–1.00)
GFR calc Af Amer: >60 mL/min (ref >60)
GFR calc non Af Amer: >60 mL/min (ref >60)
Glucose, Bld: 199 mg/dL — ABNORMAL HIGH (ref 70–99)
Potassium: 3.7 mmol/L (ref 3.5–5.1)
Sodium: 139 mmol/L (ref 135–145)
Total Bilirubin: 0.5 mg/dL (ref 0.3–1.2)
Total Protein: 7 g/dL (ref 6.5–8.1)

## 2019-08-19 NOTE — ED Triage Notes (Signed)
Pt reports weakness, anxiety, and dizzy that started 1 week ago.

## 2019-08-23 ENCOUNTER — Other Ambulatory Visit (HOSPITAL_COMMUNITY): Payer: Self-pay | Admitting: Family Medicine

## 2019-08-23 ENCOUNTER — Other Ambulatory Visit: Payer: Self-pay | Admitting: Family Medicine

## 2019-08-23 DIAGNOSIS — M542 Cervicalgia: Secondary | ICD-10-CM

## 2019-09-09 ENCOUNTER — Ambulatory Visit (HOSPITAL_COMMUNITY): Payer: Medicaid Other

## 2019-09-09 ENCOUNTER — Encounter (HOSPITAL_COMMUNITY): Payer: Self-pay

## 2019-09-14 ENCOUNTER — Other Ambulatory Visit (HOSPITAL_COMMUNITY): Payer: Self-pay | Admitting: Neurology

## 2019-09-14 DIAGNOSIS — M542 Cervicalgia: Secondary | ICD-10-CM

## 2019-12-12 ENCOUNTER — Other Ambulatory Visit: Payer: Self-pay

## 2019-12-12 ENCOUNTER — Emergency Department (HOSPITAL_COMMUNITY)
Admission: EM | Admit: 2019-12-12 | Discharge: 2019-12-13 | Disposition: A | Discharge status: Home / Self Care | Payer: Medicaid Other | Attending: Emergency Medicine | Admitting: Emergency Medicine

## 2019-12-12 ENCOUNTER — Encounter (HOSPITAL_COMMUNITY): Payer: Self-pay | Admitting: Emergency Medicine

## 2019-12-12 DIAGNOSIS — I10 Essential (primary) hypertension: Secondary | ICD-10-CM | POA: Diagnosis not present

## 2019-12-12 DIAGNOSIS — E876 Hypokalemia: Secondary | ICD-10-CM

## 2019-12-12 DIAGNOSIS — Z79899 Other long term (current) drug therapy: Secondary | ICD-10-CM | POA: Diagnosis not present

## 2019-12-12 DIAGNOSIS — Z7984 Long term (current) use of oral hypoglycemic drugs: Secondary | ICD-10-CM | POA: Diagnosis not present

## 2019-12-12 DIAGNOSIS — K219 Gastro-esophageal reflux disease without esophagitis: Secondary | ICD-10-CM | POA: Diagnosis not present

## 2019-12-12 DIAGNOSIS — R110 Nausea: Secondary | ICD-10-CM | POA: Diagnosis not present

## 2019-12-12 DIAGNOSIS — R109 Unspecified abdominal pain: Secondary | ICD-10-CM

## 2019-12-12 DIAGNOSIS — F17200 Nicotine dependence, unspecified, uncomplicated: Secondary | ICD-10-CM | POA: Diagnosis not present

## 2019-12-12 DIAGNOSIS — R1013 Epigastric pain: Secondary | ICD-10-CM | POA: Insufficient documentation

## 2019-12-12 DIAGNOSIS — M7918 Myalgia, other site: Secondary | ICD-10-CM

## 2019-12-12 LAB — URINALYSIS, ROUTINE W REFLEX MICROSCOPIC
Bilirubin Urine: NEGATIVE
Glucose, UA: NEGATIVE mg/dL
Hgb urine dipstick: NEGATIVE
Ketones, ur: NEGATIVE mg/dL
Leukocytes,Ua: NEGATIVE
Nitrite: NEGATIVE
Protein, ur: NEGATIVE mg/dL
Specific Gravity, Urine: 1.017 (ref 1.005–1.030)
pH: 5 (ref 5.0–8.0)

## 2019-12-12 NOTE — ED Triage Notes (Signed)
Pt c/o abd pain RLQ x1wk.

## 2019-12-12 NOTE — ED Notes (Signed)
Urine to lab

## 2019-12-12 NOTE — ED Notes (Addendum)
[name] called this nurse into room. When adjusting monitoring screen, portable monitor fell and hit pt on left side of head near hairline. Area assessed. Skin and neurological status intact. EDP and Charge nurse made aware of incident

## 2019-12-13 ENCOUNTER — Emergency Department (HOSPITAL_COMMUNITY): Payer: Medicaid Other

## 2019-12-13 LAB — COMPREHENSIVE METABOLIC PANEL
ALT: 18 U/L (ref 0–44)
AST: 13 U/L — ABNORMAL LOW (ref 15–41)
Albumin: 4 g/dL (ref 3.5–5.0)
Alkaline Phosphatase: 45 U/L (ref 38–126)
Anion gap: 9 (ref 5–15)
BUN: 19 mg/dL (ref 6–20)
CO2: 21 mmol/L — ABNORMAL LOW (ref 22–32)
Calcium: 9.2 mg/dL (ref 8.9–10.3)
Chloride: 104 mmol/L (ref 98–111)
Creatinine, Ser: 0.82 mg/dL (ref 0.44–1.00)
GFR, Estimated: >60 mL/min (ref >60)
Glucose, Bld: 184 mg/dL — ABNORMAL HIGH (ref 70–99)
Potassium: 3.1 mmol/L — ABNORMAL LOW (ref 3.5–5.1)
Sodium: 134 mmol/L — ABNORMAL LOW (ref 135–145)
Total Bilirubin: 0.7 mg/dL (ref 0.3–1.2)
Total Protein: 7.4 g/dL (ref 6.5–8.1)

## 2019-12-13 LAB — CBC
HCT: 42.3 % (ref 36.0–46.0)
Hemoglobin: 13.3 g/dL (ref 12.0–15.0)
MCH: 30.6 pg (ref 26.0–34.0)
MCHC: 31.4 g/dL (ref 30.0–36.0)
MCV: 97.2 fL (ref 80.0–100.0)
Platelets: 284 10*3/uL (ref 150–400)
RBC: 4.35 MIL/uL (ref 3.87–5.11)
RDW: 14.8 % (ref 11.5–15.5)
WBC: 12.9 10*3/uL — ABNORMAL HIGH (ref 4.0–10.5)
nRBC: 0 % (ref 0.0–0.2)

## 2019-12-13 LAB — LIPASE, BLOOD: Lipase: 21 U/L (ref 11–51)

## 2019-12-13 MED ORDER — CYCLOBENZAPRINE HCL 5 MG PO TABS: 5.0000 mg | ORAL_TABLET | Freq: Three times a day (TID) | ORAL | 0 refills | Status: DC | PRN

## 2019-12-13 MED ORDER — FAMOTIDINE IN NACL 20-0.9 MG/50ML-% IV SOLN
20.0000 mg | Freq: Once | INTRAVENOUS | Status: AC
  Administered 2019-12-13: 20 mg via INTRAVENOUS
  Filled 2019-12-13: qty 50

## 2019-12-13 MED ORDER — NAPROXEN 500 MG PO TABS: ORAL_TABLET | ORAL | 0 refills | Status: DC

## 2019-12-13 MED ORDER — SODIUM CHLORIDE 0.9 % IV BOLUS
1000.0000 mL | Freq: Once | INTRAVENOUS | Status: AC
  Administered 2019-12-13: 1000 mL via INTRAVENOUS

## 2019-12-13 MED ORDER — SODIUM CHLORIDE 0.9 % IV BOLUS
500.0000 mL | Freq: Once | INTRAVENOUS | Status: AC
  Administered 2019-12-13: 500 mL via INTRAVENOUS

## 2019-12-13 MED ORDER — KETOROLAC TROMETHAMINE 30 MG/ML IJ SOLN
30.0000 mg | Freq: Once | INTRAMUSCULAR | Status: AC
  Administered 2019-12-13: 30 mg via INTRAVENOUS
  Filled 2019-12-13: qty 1

## 2019-12-13 MED ORDER — ONDANSETRON HCL 4 MG/2ML IJ SOLN
4.0000 mg | Freq: Once | INTRAMUSCULAR | Status: AC
  Administered 2019-12-13: 4 mg via INTRAVENOUS
  Filled 2019-12-13: qty 2

## 2019-12-13 MED ORDER — FENTANYL CITRATE (PF) 100 MCG/2ML IJ SOLN
50.0000 ug | Freq: Once | INTRAMUSCULAR | Status: AC
  Administered 2019-12-13: 50 ug via INTRAVENOUS
  Filled 2019-12-13: qty 2

## 2019-12-13 MED ORDER — FAMOTIDINE 20 MG PO TABS: 20.0000 mg | ORAL_TABLET | Freq: Two times a day (BID) | ORAL | 0 refills | Status: DC

## 2019-12-13 MED ORDER — POTASSIUM CHLORIDE CRYS ER 20 MEQ PO TBCR: 20.0000 meq | EXTENDED_RELEASE_TABLET | Freq: Two times a day (BID) | ORAL | 0 refills | Status: DC

## 2019-12-13 NOTE — ED Provider Notes (Signed)
[facility] EMERGENCY DEPARTMENT Provider Note   CSN: [id number] Arrival date & time: 12/12/19  2103   Time seen 1:11 AM  History Chief Complaint  Patient presents with  . Abdominal Pain    Isabella Walker is a 39 y.o. female.  HPI   Patient states she started having epigastric abdominal pain about a week ago.  She had nausea without vomiting.  She states she had diarrhea 7 or 8 times a day that was loose and watery.  That pain was described as achy.  However the pain started being more in her right lateral abdomen today.  She states the pain was so severe doubled her over and described it as sharp and stabbing.  She denies any acid reflux symptoms.  She has not had any fever.  She states eating and moving around a lot makes the pain worse.  She denies fever, dysuria, frequency, or hematuria.  She states she has never had this pain before.  She has not eaten anything different before the pain started.  She denies any prior abdominal surgeries.  PCP Pearson Grippe, MD   Past Medical History:  Diagnosis Date  . Anxiety   . Complication of anesthesia   . Depression complicating pregnancy, postpartum 2012  . GERD (gastroesophageal reflux disease)   . Headache(784.0)    hx of migraines-last one few months ago.  Marland Kitchen Hypertension     Patient Active Problem List   Diagnosis Date Noted  . S/P carpal tunnel release 08/20/2018 09/10/2018  . Carpal tunnel syndrome of left wrist   . Amenorrhea 09/06/2016  . Body mass index (BMI) of 45.0 to 49.9 in adult (HCC) 09/06/2016  . Depression with anxiety 09/06/2016  . Muscle weakness (generalized) 11/05/2011  . SHOULDER PAIN 10/27/2007  . CERVICAL SPASM 10/27/2007    Past Surgical History:  Procedure Laterality Date  . CARPAL TUNNEL RELEASE Left 08/20/2018   Procedure: CARPAL TUNNEL RELEASE;  Surgeon: Vickki Hearing, MD;  Location: [facility];  Service: Orthopedics;  Laterality: Left;  . INSERTION OF MESH N/A 05/25/2012   Procedure: INSERTION OF  MESH;  Surgeon: Dalia Heading, MD;  Location: [facility];  Service: General;  Laterality: N/A;  . ORIF KNEE DISLOCATION Right   . VENTRAL HERNIA REPAIR N/A 05/25/2012   Procedure: LAPAROSCOPIC VENTRAL HERNIA;  Surgeon: Dalia Heading, MD;  Location: [facility];  Service: General;  Laterality: N/A;     OB History   No obstetric history on file.     Family History  Problem Relation Age of Onset  . High blood pressure Mother   . Diabetes Father   . High blood pressure Father   . Cancer Paternal Aunt        ovarian    Social History   Tobacco Use  . Smoking status: Current Every Day Smoker    Packs/day: 1.00    Years: 15.00    Pack years: 15.00    Last attempt to quit: 01/31/2017    Years since quitting: 2.8  . Smokeless tobacco: Never Used  Vaping Use  . Vaping Use: Former  Substance Use Topics  . Alcohol use: No  . Drug use: No    Home Medications Prior to Admission medications   Medication Sig Start Date End Date Taking? Authorizing Provider  AIMOVIG 140 MG/ML SOAJ Inject 140 mg into the skin every 28 (twenty-eight) days.  07/26/18   [provider]  buPROPion (WELLBUTRIN XL) 150 MG 24 hr tablet Take 300  mg by mouth daily.    [provider]  celecoxib (CELEBREX) 100 MG capsule Take 100 mg by mouth 2 (two) times daily.    [provider]  cyclobenzaprine (FLEXERIL) 5 MG tablet Take 1 tablet (5 mg total) by mouth 3 (three) times daily as needed. 12/13/19   Devoria Albe, MD  ergocalciferol (VITAMIN D2) 1.25 MG (50000 UT) capsule Take 50,000 Units by mouth once a week.    [provider]  famotidine (PEPCID) 20 MG tablet Take 1 tablet (20 mg total) by mouth 2 (two) times daily. 12/13/19   Devoria Albe, MD  HYDROcodone-acetaminophen (NORCO) 7.5-325 MG tablet Take 1 tablet by mouth every 6 (six) hours as needed for moderate pain.    [provider]  HYDROcodone-acetaminophen (NORCO) 7.5-325 MG tablet Take 1 tablet by mouth every 4 (four) hours as  needed for moderate pain. 08/20/18   Vickki Hearing, MD  hydrOXYzine (ATARAX/VISTARIL) 25 MG tablet Take 25 mg by mouth every 8 (eight) hours as needed for anxiety.     [provider]  levonorgestrel (MIRENA, 52 MG,) 20 MCG/24HR IUD 1 each by Intrauterine route once.     [provider]  levothyroxine (SYNTHROID, LEVOTHROID) 125 MCG tablet Take 125 mcg by mouth daily before breakfast.    [provider]  loratadine (CLARITIN) 10 MG tablet Take 10 mg by mouth every morning.    [provider]  LYRICA 200 MG capsule Take 200 mg by mouth 3 (three) times daily.  02/03/17   [provider]  metFORMIN (GLUCOPHAGE) 500 MG tablet Take 500 mg by mouth every other day.    [provider]  naloxegol oxalate (MOVANTIK) 25 MG TABS tablet Take 25 mg by mouth daily.    [provider]  naproxen (NAPROSYN) 500 MG tablet Take 1 po BID with food prn pain 12/13/19   Devoria Albe, MD  omeprazole (PRILOSEC) 20 MG capsule Take 40 mg by mouth every morning.     [provider]  potassium chloride SA (KLOR-CON) 20 MEQ tablet Take 1 tablet (20 mEq total) by mouth 2 (two) times daily. 12/13/19   Devoria Albe, MD  rosuvastatin (CRESTOR) 10 MG tablet Take 10 mg by mouth daily.    [provider]  sertraline (ZOLOFT) 100 MG tablet Take 200 mg by mouth daily.  01/22/17   [provider]  tiZANidine (ZANAFLEX) 4 MG tablet Take 4 mg by mouth every 8 (eight) hours as needed for muscle spasms.  01/27/17   [provider]  triamterene-hydrochlorothiazide (MAXZIDE-25) 37.5-25 MG per tablet Take 1 tablet by mouth every morning.    [provider]  zolpidem (AMBIEN) 10 MG tablet Take 10 mg by mouth at bedtime.    [provider]    Allergies    Bextra [valdecoxib]  Review of Systems   Review of Systems  All other systems reviewed and are negative.   Physical Exam Updated Vital Signs BP 138/82   Pulse (!) 56    Temp 99 F (37.2 C) (Oral)   Resp 17   Ht 5\' 3"  (1.6 m)   Wt 131 kg   SpO2 99%   BMI 51.18 kg/m   Physical Exam Vitals and nursing note reviewed.  Constitutional:      General: She is not in acute distress.    Appearance: Normal appearance. She is obese.  HENT:     Head: Normocephalic and atraumatic.     Right Ear: External ear normal.  Left Ear: External ear normal.     Mouth/Throat:     Mouth: Mucous membranes are dry.  Eyes:     Conjunctiva/sclera: Conjunctivae normal.     Pupils: Pupils are equal, round, and reactive to light.  Cardiovascular:     Rate and Rhythm: Normal rate and regular rhythm.     Pulses: Normal pulses.     Heart sounds: No murmur heard.   Pulmonary:     Effort: Pulmonary effort is normal. No respiratory distress.     Breath sounds: Normal breath sounds.  Abdominal:     Palpations: Abdomen is soft.     Tenderness: There is abdominal tenderness. There is right CVA tenderness. There is no left CVA tenderness, guarding or rebound.    Musculoskeletal:        General: Normal range of motion.     Cervical back: Normal range of motion.  Skin:    General: Skin is warm and dry.     Findings: No rash.  Neurological:     General: No focal deficit present.     Mental Status: She is alert and oriented to person, place, and time.     Cranial Nerves: No cranial nerve deficit.  Psychiatric:        Mood and Affect: Affect is flat.        Speech: Speech is delayed.        Behavior: Behavior is slowed.     ED Results / Procedures / Treatments   Labs (all labs ordered are listed, but only abnormal results are displayed) Results for orders placed or performed during the hospital encounter of 12/12/19  Lipase, blood  Result Value Ref Range   Lipase 21 11 - 51 U/L  Comprehensive metabolic panel  Result Value Ref Range   Sodium 134 (L) 135 - 145 mmol/L   Potassium 3.1 (L) 3.5 - 5.1 mmol/L   Chloride 104 98 - 111 mmol/L   CO2 21 (L) 22 - 32 mmol/L    Glucose, Bld 184 (H) 70 - 99 mg/dL   BUN 19 6 - 20 mg/dL   Creatinine, Ser 3.81 0.44 - 1.00 mg/dL   Calcium 9.2 8.9 - 01.7 mg/dL   Total Protein 7.4 6.5 - 8.1 g/dL   Albumin 4.0 3.5 - 5.0 g/dL   AST 13 (L) 15 - 41 U/L   ALT 18 0 - 44 U/L   Alkaline Phosphatase 45 38 - 126 U/L   Total Bilirubin 0.7 0.3 - 1.2 mg/dL   GFR, Estimated >51 >02 mL/min   Anion gap 9 5 - 15  CBC  Result Value Ref Range   WBC 12.9 (H) 4.0 - 10.5 K/uL   RBC 4.35 3.87 - 5.11 MIL/uL   Hemoglobin 13.3 12.0 - 15.0 g/dL   HCT 58.5 36 - 46 %   MCV 97.2 80.0 - 100.0 fL   MCH 30.6 26.0 - 34.0 pg   MCHC 31.4 30.0 - 36.0 g/dL   RDW 27.7 82.4 - 23.5 %   Platelets 284 150 - 400 K/uL   nRBC 0.0 0.0 - 0.2 %  Urinalysis, Routine w reflex microscopic Urine, Clean Catch  Result Value Ref Range   Color, Urine YELLOW YELLOW   APPearance CLEAR CLEAR   Specific Gravity, Urine 1.017 1.005 - 1.030   pH 5.0 5.0 - 8.0   Glucose, UA NEGATIVE NEGATIVE mg/dL   Hgb urine dipstick NEGATIVE NEGATIVE   Bilirubin Urine NEGATIVE NEGATIVE   Ketones, ur NEGATIVE NEGATIVE  mg/dL   Protein, ur NEGATIVE NEGATIVE mg/dL   Nitrite NEGATIVE NEGATIVE   Leukocytes,Ua NEGATIVE NEGATIVE   Laboratory interpretation all normal except leukocytosis, hypokalemia    EKG None  Radiology CT Renal Stone Study  Result Date: 12/13/2019 CLINICAL DATA:  Right lower quadrant abdominal pain EXAM: CT ABDOMEN AND PELVIS WITHOUT CONTRAST TECHNIQUE: Multidetector CT imaging of the abdomen and pelvis was performed following the standard protocol without IV contrast. COMPARISON:  None. FINDINGS: Lower chest: The visualized heart size within normal limits. No pericardial fluid/thickening. No hiatal hernia. The visualized portions of the lungs are clear. Hepatobiliary: The liver is normal in density without focal abnormality.The main portal vein is patent. No evidence of calcified gallstones, gallbladder wall thickening or biliary dilatation. Pancreas: Unremarkable.  No pancreatic ductal dilatation or surrounding inflammatory changes. Spleen: Normal in size without focal abnormality. Adrenals/Urinary Tract: Both adrenal glands appear normal. The kidneys and collecting system appear normal without evidence of urinary tract calculus or hydronephrosis. Bladder is unremarkable. Stomach/Bowel: The stomach, small bowel, and colon are normal in appearance. No inflammatory changes, wall thickening, or obstructive findings.The appendix is normal. Vascular/Lymphatic: There are no enlarged mesenteric, retroperitoneal, or pelvic lymph nodes. No significant vascular findings are present. Reproductive: IUD seen within the endometrial canal. Other: No evidence of abdominal wall mass or hernia. Musculoskeletal: No acute or significant osseous findings. IMPRESSION: No acute abdominal or pelvic pathology. Electronically Signed   By: Jonna ClarkBindu  Avutu M.D.   On: 12/13/2019 02:17    Procedures Procedures (including critical care time)  Medications Ordered in ED Medications  famotidine (PEPCID) IVPB 20 mg premix (20 mg Intravenous New Bag/Given 12/13/19 0355)  sodium chloride 0.9 % bolus 1,000 mL (0 mLs Intravenous Stopped 12/13/19 0243)  sodium chloride 0.9 % bolus 500 mL (0 mLs Intravenous Stopped 12/13/19 0243)  fentaNYL (SUBLIMAZE) injection 50 mcg (50 mcg Intravenous Given 12/13/19 0126)  ondansetron (ZOFRAN) injection 4 mg (4 mg Intravenous Given 12/13/19 0126)  ketorolac (TORADOL) 30 MG/ML injection 30 mg (30 mg Intravenous Given 12/13/19 0417)    ED Course  I have reviewed the triage vital signs and the nursing notes.  Pertinent labs & imaging results that were available during my care of the patient were reviewed by me and considered in my medical decision making (see chart for details).    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  When I examined it I did not see any obvious bruising or swelling.  She states it sore.  We discussed that it may develop a bruise and she could even have some bruising show up in her eyelid just due to gravity and that should be of no concern.  Patient had no loss of consciousness.  I do not feel need to do CT scan of her head.  Recheck at 4:10 AM we discussed her CT results.  Patient does admit to moving furniture recently so I suspect her pain is go to be muscular.  She was discharged home with the nonsteroidal anti-inflammatory drug, muscle relaxer and Pepcid.  She also was given a few days of potassium for her hypokalemia.  Final Clinical Impression(s) / ED Diagnoses Final diagnoses:  Abdominal pain, unspecified abdominal location  Rx / DC Orders ED Discharge Orders         Ordered    naproxen (NAPROSYN) 500 MG tablet        12/13/19 0420    cyclobenzaprine (FLEXERIL) 5 MG tablet  3 times daily PRN        12/13/19 0420    famotidine (PEPCID) 20 MG tablet  2 times daily,   Status:  Discontinued        12/13/19 0420    famotidine (PEPCID) 20 MG tablet  2 times daily        12/13/19 0421    potassium chloride SA (KLOR-CON) 20 MEQ tablet  2 times daily        12/13/19 0423         Plan discharge  Devoria Albe, MD, Concha Pyo, MD 12/13/19 [phone]

## 2019-12-13 NOTE — Discharge Instructions (Addendum)
Use ice and heat on your abdomen for comfort. You can use SalonPas on your abdomen for comfort. Take the medication as prescribed. Recheck if you get a fever, vomiting or diarrhea.

## 2020-07-12 ENCOUNTER — Ambulatory Visit (HOSPITAL_COMMUNITY): Payer: Medicaid Other | Attending: Neurology

## 2020-07-12 ENCOUNTER — Encounter (HOSPITAL_COMMUNITY): Payer: Self-pay

## 2020-07-12 ENCOUNTER — Other Ambulatory Visit: Payer: Self-pay

## 2020-07-12 DIAGNOSIS — M797 Fibromyalgia: Secondary | ICD-10-CM | POA: Insufficient documentation

## 2020-07-12 DIAGNOSIS — M6281 Muscle weakness (generalized): Secondary | ICD-10-CM | POA: Insufficient documentation

## 2020-07-12 DIAGNOSIS — R29898 Other symptoms and signs involving the musculoskeletal system: Secondary | ICD-10-CM | POA: Insufficient documentation

## 2020-07-12 NOTE — Therapy (Signed)
[facility] [address] [place], [place], [address] Phone: [phone]   Fax:  [phone]  Physical Therapy Evaluation  Patient Details  Name: Celestial R Draper MRN: [id number] Date of Birth: [birth year] Referring Provider (PT): Beryle Beams, MD   Encounter Date: 07/12/2020   PT End of Session - 07/12/20 0903     Visit Number 1    Number of Visits 6    Date for PT Re-Evaluation 08/23/20    Authorization Type [place] [employer] U[employer]    Authorization - Visit Number 1    Authorization - Number of Visits 27    PT Start Time 0900    PT Stop Time 0945    PT Time Calculation (min) 45 min    Activity Tolerance Patient tolerated treatment well;Patient limited by fatigue    Behavior During Therapy [facility] for tasks assessed/performed             Past Medical History:  Diagnosis Date   Anxiety    Complication of anesthesia    Depression complicating pregnancy, postpartum 2012   GERD (gastroesophageal reflux disease)    Headache(784.0)    hx of migraines-last one few months ago.   Hypertension     Past Surgical History:  Procedure Laterality Date   CARPAL TUNNEL RELEASE Left 08/20/2018   Procedure: CARPAL TUNNEL RELEASE;  Surgeon: Vickki Hearing, MD;  Location: [facility];  Service: Orthopedics;  Laterality: Left;   INSERTION OF MESH N/A 05/25/2012   Procedure: INSERTION OF MESH;  Surgeon: Dalia Heading, MD;  Location: [facility];  Service: General;  Laterality: N/A;   ORIF KNEE DISLOCATION Right    VENTRAL HERNIA REPAIR N/A 05/25/2012   Procedure: LAPAROSCOPIC VENTRAL HERNIA;  Surgeon: Dalia Heading, MD;  Location: [facility];  Service: General;  Laterality: N/A;    There were no vitals filed for this visit.    Subjective Assessment - 07/12/20 0906     Subjective Pt notes chronic hx of fibromyalgia with pain and discomfort along neck, upper traps, and along middle of her back and notes numbness along her back.  Pt reports the MD recently  discontinued Gabapentin due to ineffectiveness    Currently in Pain? Yes    Pain Score 8     Pain Location Neck    Pain Orientation Right;Left;Mid    Pain Descriptors / Indicators Aching;Tiring;Sore    Pain Type Chronic pain    Pain Onset More than a month ago    Pain Frequency Constant    Aggravating Factors  "everything"    Pain Relieving Factors "nothing"                [facility] PT Assessment - 07/12/20 0001       Assessment   Medical Diagnosis Fibromyalgia    Referring Provider (PT) Beryle Beams, MD    Hand Dominance Left      Restrictions   Weight Bearing Restrictions No      Balance Screen   Has the patient fallen in the past 6 months No    Has the patient had a decrease in activity level because of a fear of falling?  No    Is the patient reluctant to leave their home because of a fear of falling?  No      Home [employer]ist information centre manager residence    Living Arrangements Spouse/significant other;Other relatives    Available Help at Discharge Family    Type of Home  Mobile home    Home Access Ramped entrance    Home Layout One level    Home Equipment None      Prior Function   Level of Independence Independent    Vocation Unemployed    Leisure fishing, outdoors, family time      Posture/Postural Control   Posture/Postural Control Postural limitations    Postural Limitations Rounded Shoulders;Forward head      ROM / Strength   AROM / PROM / Strength AROM;Strength      AROM   AROM Assessment Site Cervical    Cervical Flexion WNL    Cervical Extension 10% limited    Cervical - Right Side Bend WNL    Cervical - Left Side Bend 25% limited    Cervical - Right Rotation WNL    Cervical - Left Rotation 25% limited      Strength   Overall Strength Within functional limits for tasks performed    Strength Assessment Site Shoulder      Palpation   Palpation comment tenderness to palpation along trapezial ridge      Ambulation/Gait    Ambulation/Gait Yes    Ambulation/Gait Assistance 7: Independent    Ambulation Distance (Feet) 678 Feet    Assistive device None    Gait Pattern Within Functional Limits    Ambulation Surface Level;Indoor    Gait velocity decreased    Gait Comments : 678 ft      6 Minute Walk- Baseline   6 Minute Walk- Baseline yes    Perceived Rate of Exertion (Borg) 7- Very, very light      6 Minute walk- Post Test   6 Minute Walk Post Test yes    Perceived Rate of Exertion (Borg) 13- Somewhat hard      6 minute walk test results    Aerobic Endurance Distance Walked 678                        Objective measurements completed on examination: See above findings.       [facility] Adult PT Treatment/Exercise - 07/12/20 0001       Exercises   Exercises Neck      Neck Exercises: Seated   Neck Retraction 10 reps;5 secs                    PT Education - 07/12/20 0942     Education Details assessment findings and rationale of tx strategy for FM and benefits of consistent cardiovascular exercise    Person(s) Educated Patient    Methods Explanation    Comprehension Verbalized understanding              PT Short Term Goals - 07/12/20 0949       PT SHORT TERM GOAL #1   Title Patient will be independent with HEP in order to improve functional outcomes.    Time 3    Period Weeks    Status New    Target Date 08/02/20      PT SHORT TERM GOAL #2   Title Patient will report at least 25% improvement in symptoms for improved quality of life.    Baseline 8/10 neck, upper trap pain    Time 3    Period Weeks    Status New    Target Date 08/02/20      PT SHORT TERM GOAL #3   Title Demonstrate improved ambulation tolerance as evidenced by distance of 678 ft  during with RPE not exceeding 11/20    Baseline 678 ft 13/20 RPE    Time 3    Period Weeks    Status New    Target Date 08/02/20               PT Long Term Goals - 07/12/20 0952       PT  LONG TERM GOAL #1   Title Patient will report at least 50% improvement in symptoms for improved quality of life.    Baseline 8/10 neck/upper back pain    Time 6    Period Weeks    Status New    Target Date 08/23/20      PT LONG TERM GOAL #2   Title Patient will demo improved ambulation tolerance as evidenced by distance of 678 ft during and RPE not exceeding 7/20    Baseline 678 ft w/ RPE 13/20    Time 6    Period Weeks    Status New    Target Date 08/23/20                    Plan - 07/12/20 0945     Clinical Impression Statement Pt is 40 yo lady with reports of chronic neck, upper back, middle back pain which she relates to FM and demonstrates reduced functional activity tolerance, postural limitations, chronic pain, increased exertion with ambulation, and general weakness/deconditioning.  Pt would benefit from PT services to develop/instruct in progressive aerobic activity program, improve postural alignment/strength/awareness to reduce stress/pain to facilitate greater activity tolerance    Personal Factors and Comorbidities Comorbidity 1;Time since onset of injury/illness/exacerbation    Comorbidities PMH    Examination-Activity Limitations Lift;Stand;Locomotion Level    Examination-Participation Restrictions Yard Work;Shop;Occupation;Community Activity    Stability/Clinical Decision Making Stable/Uncomplicated    Clinical Decision Making Low    Rehab Potential Fair    PT Frequency 1x / week    PT Duration 6 weeks    PT Treatment/Interventions ADLs/Self Care Home Management;Aquatic Therapy;Electrical Stimulation;Ultrasound;Traction;Moist Heat;Gait training;Functional mobility training;Therapeutic activities;Therapeutic exercise;Balance training;Patient/family education;Manual techniques;Energy conservation;Dry needling;Spinal Manipulations    PT Next Visit Plan continue with aerobic training, postural activities    PT Home Exercise Plan asked to find a measurable  distance at her home for repeated walking tests. chin retreactions    Consulted and Agree with Plan of Care Patient             Patient will benefit from skilled therapeutic intervention in order to improve the following deficits and impairments:  Decreased activity tolerance, Decreased strength, Decreased range of motion, Difficulty walking, Postural dysfunction, Improper body mechanics, Pain, Obesity  Visit Diagnosis: Fibromyalgia  Muscle weakness (generalized)  Other symptoms and signs involving the musculoskeletal system     Problem List Patient Active Problem List   Diagnosis Date Noted   S/P carpal tunnel release 08/20/2018 09/10/2018   Carpal tunnel syndrome of left wrist    Amenorrhea 09/06/2016   Body mass index (BMI) of 45.0 to 49.9 in adult (HCC) 09/06/2016   Depression with anxiety 09/06/2016   Muscle weakness (generalized) 11/05/2011   SHOULDER PAIN 10/27/2007   CERVICAL SPASM 10/27/2007   9:56 AM, 07/12/20 M. Shary Decamp, PT, DPT Physical Therapist- [place] Office Number: [phone]   [facility] [address] [place], [place], [address] Phone: [phone]   Fax:  [phone]  Name: Laree R Draper MRN: [id number] Date of Birth: 04-17-1980

## 2020-07-12 NOTE — Patient Instructions (Signed)
Access Code: [email or web address] URL: [email or web address] Date: 07/12/2020 Prepared by: Shary Decamp  Exercises Seated Cervical Retraction - 1 x daily - 7 x weekly - 3 sets - 10 reps - 5 sec hold

## 2020-07-19 ENCOUNTER — Ambulatory Visit (HOSPITAL_COMMUNITY): Payer: Medicaid Other | Admitting: Physical Therapy

## 2020-07-19 ENCOUNTER — Other Ambulatory Visit: Payer: Self-pay

## 2020-07-19 DIAGNOSIS — M6281 Muscle weakness (generalized): Secondary | ICD-10-CM

## 2020-07-19 DIAGNOSIS — R29898 Other symptoms and signs involving the musculoskeletal system: Secondary | ICD-10-CM

## 2020-07-19 DIAGNOSIS — M797 Fibromyalgia: Secondary | ICD-10-CM

## 2020-07-19 NOTE — Therapy (Signed)
[facility] [address] [place], [place], [address] Phone: [phone]   Fax:  [phone]  Physical Therapy Treatment  Patient Details  Name: Isabella Walker MRN: [id number] Date of Birth: [birth year] Referring Provider (PT): Beryle Beams, MD   Encounter Date: 07/19/2020   PT End of Session - 07/19/20 0848     Visit Number 2    Number of Visits 6    Date for PT Re-Evaluation 08/23/20    Authorization Type [place] Medicaid UHC    Authorization - Visit Number 2    Authorization - Number of Visits 27    PT Start Time [phone]    PT Stop Time 0914    PT Time Calculation (min) 38 min    Activity Tolerance Patient tolerated treatment well;Patient limited by fatigue    Behavior During Therapy [facility] for tasks assessed/performed             Past Medical History:  Diagnosis Date   Anxiety    Complication of anesthesia    Depression complicating pregnancy, postpartum 2012   GERD (gastroesophageal reflux disease)    Headache(784.0)    hx of migraines-last one few months ago.   Hypertension     Past Surgical History:  Procedure Laterality Date   CARPAL TUNNEL RELEASE Left 08/20/2018   Procedure: CARPAL TUNNEL RELEASE;  Surgeon: Vickki Hearing, MD;  Location: [facility];  Service: Orthopedics;  Laterality: Left;   INSERTION OF MESH N/A 05/25/2012   Procedure: INSERTION OF MESH;  Surgeon: Dalia Heading, MD;  Location: [facility];  Service: General;  Laterality: N/A;   ORIF KNEE DISLOCATION Right    VENTRAL HERNIA REPAIR N/A 05/25/2012   Procedure: LAPAROSCOPIC VENTRAL HERNIA;  Surgeon: Dalia Heading, MD;  Location: [facility];  Service: General;  Laterality: N/A;    There were no vitals filed for this visit.   Subjective Assessment - 07/19/20 0843     Subjective pt states she is hurting pretty bad today at 9/10 in her neck and shoulders.  States she's trying not to move it.  States she's walking once a day slowly for 6 minutes and she can't go  any further due to back and Lt hip pain.    Currently in Pain? Yes    Pain Score 9     Pain Location Neck    Pain Orientation Right;Left    Pain Type Chronic pain                               [facility] Adult PT Treatment/Exercise - 07/19/20 0001       Neck Exercises: Seated   Neck Retraction 10 reps;5 secs    W Back 10 reps    Other Seated Exercise cervical excursions 5X    Other Seated Exercise thoracic excursions with UE movements 5X each      Neck Exercises: Supine   Other Supine Exercise abdominal isometrics 10X5", bridge 10X, SLR 10X                    PT Education - 07/19/20 0845     Education Details review of HEP and goals moving forward.    Person(s) Educated Patient    Methods Explanation    Comprehension Verbalized understanding              PT Short Term Goals - 07/19/20 0845  PT SHORT TERM GOAL #1   Title Patient will be independent with HEP in order to improve functional outcomes.    Time 3    Period Weeks    Status On-going    Target Date 08/02/20      PT SHORT TERM GOAL #2   Title Patient will report at least 25% improvement in symptoms for improved quality of life.    Baseline 8/10 neck, upper trap pain    Time 3    Period Weeks    Status On-going    Target Date 08/02/20      PT SHORT TERM GOAL #3   Title Demonstrate improved ambulation tolerance as evidenced by distance of 678 ft during with RPE not exceeding 11/20    Baseline 678 ft 13/20 RPE    Time 3    Period Weeks    Status On-going    Target Date 08/02/20               PT Long Term Goals - 07/19/20 0847       PT LONG TERM GOAL #1   Title Patient will report at least 50% improvement in symptoms for improved quality of life.    Baseline 8/10 neck/upper back pain    Time 6    Period Weeks    Status On-going      PT LONG TERM GOAL #2   Title Patient will demo improved ambulation tolerance as evidenced by distance of 678 ft during  and RPE not exceeding 7/20    Baseline 678 ft w/ RPE 13/20    Time 6    Period Weeks    Status On-going                   Plan - 07/19/20 1011     Clinical Impression Statement Reviewed goals and POC moving forward.  Pt with generalized pain today "all over" but most pain in cervical and thoracic area.  Discussed importance of mobility and improving activity tolerance through daily ambulation and exercise.  Educated on core stability and how this impacts the spine.  Began core stab exercises with ability to complete without difficulty.  Added cervical and thoracic spinal mobility and given for HEP.  Pt with noted reduced ROM in all planes. Pt reported no change in pain at end of session.    Personal Factors and Comorbidities Comorbidity 1;Time since onset of injury/illness/exacerbation    Comorbidities PMH    Examination-Activity Limitations Lift;Stand;Locomotion Level    Examination-Participation Restrictions Yard Work;Shop;Occupation;Community Activity    Stability/Clinical Decision Making Stable/Uncomplicated    Rehab Potential Fair    PT Frequency 1x / week    PT Duration 6 weeks    PT Treatment/Interventions ADLs/Self Care Home Management;Aquatic Therapy;Electrical Stimulation;Ultrasound;Traction;Moist Heat;Gait training;Functional mobility training;Therapeutic activities;Therapeutic exercise;Balance training;Patient/family education;Manual techniques;Energy conservation;Dry needling;Spinal Manipulations    PT Next Visit Plan continue with aerobic training, postural activities.  Begin with ambulation next session in gym.  Add tband postural strengtheing exercises.    PT Home Exercise Plan asked to find a measurable distance at her home for repeated walking tests. chin retreactions   7/13:  cervical and thoracic excursions in seated positon; 5X each    Consulted and Agree with Plan of Care Patient             Patient will benefit from skilled therapeutic intervention  in order to improve the following deficits and impairments:  Decreased activity tolerance, Decreased strength, Decreased range of  motion, Difficulty walking, Postural dysfunction, Improper body mechanics, Pain, Obesity  Visit Diagnosis: Fibromyalgia  Muscle weakness (generalized)  Other symptoms and signs involving the musculoskeletal system     Problem List Patient Active Problem List   Diagnosis Date Noted   S/P carpal tunnel release 08/20/2018 09/10/2018   Carpal tunnel syndrome of left wrist    Amenorrhea 09/06/2016   Body mass index (BMI) of 45.0 to 49.9 in adult [facility]) 09/06/2016   Depression with anxiety 09/06/2016   Muscle weakness (generalized) 11/05/2011   SHOULDER PAIN 10/27/2007   CERVICAL SPASM 10/27/2007   Lurena Nida, PTA/CLT [phone]  Lurena Nida 07/19/2020, 10:13 AM  [facility] [address] [place], [place], [address] Phone: [phone]   Fax:  [phone]  Name: Isabella Walker MRN: [id number] Date of Birth: 1980/04/11

## 2020-07-26 ENCOUNTER — Telehealth (HOSPITAL_COMMUNITY): Payer: Self-pay

## 2020-07-26 ENCOUNTER — Encounter (HOSPITAL_COMMUNITY): Payer: Medicaid Other

## 2020-07-26 NOTE — Telephone Encounter (Signed)
Called left message regarding missed appointment and reminder of next appointment date. Notified of "No-show policy".  3:32 PM, 07/26/20 M. Shary Decamp, PT, DPT Physical Therapist- [place] Office Number: [phone]

## 2020-08-02 ENCOUNTER — Ambulatory Visit (HOSPITAL_COMMUNITY): Payer: Medicaid Other

## 2020-08-02 ENCOUNTER — Telehealth (HOSPITAL_COMMUNITY): Payer: Self-pay

## 2020-08-02 NOTE — Telephone Encounter (Signed)
No call, no show for appointment. Left message regarding policy and notified she will need to call to schedule future appointments should she wish to maintain POC.  1:38 PM, 08/02/20 M. Shary Decamp, PT, DPT Physical Therapist- [place] Office Number: [phone]

## 2020-08-09 ENCOUNTER — Encounter (HOSPITAL_COMMUNITY): Payer: Medicaid Other

## 2020-08-16 ENCOUNTER — Encounter (HOSPITAL_COMMUNITY): Payer: Medicaid Other | Admitting: Physical Therapy

## 2020-08-23 ENCOUNTER — Encounter (HOSPITAL_COMMUNITY): Payer: Medicaid Other | Admitting: Physical Therapy

## 2020-09-08 ENCOUNTER — Emergency Department (HOSPITAL_COMMUNITY): Payer: Medicaid Other

## 2020-09-08 ENCOUNTER — Emergency Department (HOSPITAL_COMMUNITY)
Admission: EM | Admit: 2020-09-08 | Discharge: 2020-09-09 | Disposition: A | Discharge status: Home / Self Care | Payer: Medicaid Other | Attending: Emergency Medicine | Admitting: Emergency Medicine

## 2020-09-08 DIAGNOSIS — S2020XA Contusion of thorax, unspecified, initial encounter: Secondary | ICD-10-CM | POA: Insufficient documentation

## 2020-09-08 DIAGNOSIS — S299XXA Unspecified injury of thorax, initial encounter: Secondary | ICD-10-CM | POA: Diagnosis present

## 2020-09-08 DIAGNOSIS — S0003XA Contusion of scalp, initial encounter: Secondary | ICD-10-CM | POA: Insufficient documentation

## 2020-09-08 DIAGNOSIS — T1490XA Injury, unspecified, initial encounter: Secondary | ICD-10-CM

## 2020-09-08 DIAGNOSIS — D72829 Elevated white blood cell count, unspecified: Secondary | ICD-10-CM | POA: Insufficient documentation

## 2020-09-08 DIAGNOSIS — Z7982 Long term (current) use of aspirin: Secondary | ICD-10-CM | POA: Diagnosis not present

## 2020-09-08 DIAGNOSIS — S301XXA Contusion of abdominal wall, initial encounter: Secondary | ICD-10-CM | POA: Insufficient documentation

## 2020-09-08 DIAGNOSIS — Y92410 Unspecified street and highway as the place of occurrence of the external cause: Secondary | ICD-10-CM | POA: Insufficient documentation

## 2020-09-08 DIAGNOSIS — Z20822 Contact with and (suspected) exposure to covid-19: Secondary | ICD-10-CM | POA: Insufficient documentation

## 2020-09-08 LAB — I-STAT CHEM 8, ED
BUN: 16 mg/dL (ref 6–20)
Calcium, Ion: 1.13 mmol/L — ABNORMAL LOW (ref 1.15–1.40)
Chloride: 109 mmol/L (ref 98–111)
Creatinine, Ser: 0.8 mg/dL (ref 0.44–1.00)
Glucose, Bld: 213 mg/dL — ABNORMAL HIGH (ref 70–99)
HCT: 41 % (ref 36.0–46.0)
Hemoglobin: 13.9 g/dL (ref 12.0–15.0)
Potassium: 3.8 mmol/L (ref 3.5–5.1)
Sodium: 139 mmol/L (ref 135–145)
TCO2: 19 mmol/L — ABNORMAL LOW (ref 22–32)

## 2020-09-08 LAB — COMPREHENSIVE METABOLIC PANEL
ALT: 29 U/L (ref 0–44)
AST: 26 U/L (ref 15–41)
Albumin: 3.6 g/dL (ref 3.5–5.0)
Alkaline Phosphatase: 37 U/L — ABNORMAL LOW (ref 38–126)
Anion gap: 11 (ref 5–15)
BUN: 16 mg/dL (ref 6–20)
CO2: 18 mmol/L — ABNORMAL LOW (ref 22–32)
Calcium: 8.9 mg/dL (ref 8.9–10.3)
Chloride: 108 mmol/L (ref 98–111)
Creatinine, Ser: 0.9 mg/dL (ref 0.44–1.00)
GFR, Estimated: >60 mL/min (ref >60)
Glucose, Bld: 221 mg/dL — ABNORMAL HIGH (ref 70–99)
Potassium: 3.8 mmol/L (ref 3.5–5.1)
Sodium: 137 mmol/L (ref 135–145)
Total Bilirubin: 1 mg/dL (ref 0.3–1.2)
Total Protein: 6.7 g/dL (ref 6.5–8.1)

## 2020-09-08 LAB — CBC
HCT: 41.6 % (ref 36.0–46.0)
Hemoglobin: 13.2 g/dL (ref 12.0–15.0)
MCH: 33.3 pg (ref 26.0–34.0)
MCHC: 31.7 g/dL (ref 30.0–36.0)
MCV: 105.1 fL — ABNORMAL HIGH (ref 80.0–100.0)
Platelets: 283 10*3/uL (ref 150–400)
RBC: 3.96 MIL/uL (ref 3.87–5.11)
RDW: 14.6 % (ref 11.5–15.5)
WBC: 17.4 10*3/uL — ABNORMAL HIGH (ref 4.0–10.5)
nRBC: 0 % (ref 0.0–0.2)

## 2020-09-08 LAB — SAMPLE TO BLOOD BANK

## 2020-09-08 LAB — LACTIC ACID, PLASMA: Lactic Acid, Venous: 2.4 mmol/L (ref 0.5–1.9)

## 2020-09-08 LAB — PROTIME-INR
INR: 1 (ref 0.8–1.2)
Prothrombin Time: 13 seconds (ref 11.4–15.2)

## 2020-09-08 LAB — ETHANOL: Alcohol, Ethyl (B): <10 mg/dL (ref <10)

## 2020-09-08 LAB — I-STAT BETA HCG BLOOD, ED (MC, WL, AP ONLY): I-stat hCG, quantitative: 7.3 m[IU]/mL — ABNORMAL HIGH (ref <5)

## 2020-09-08 MED ORDER — IOHEXOL 350 MG/ML SOLN
100.0000 mL | Freq: Once | INTRAVENOUS | Status: AC | PRN
  Administered 2020-09-08: 100 mL via INTRAVENOUS

## 2020-09-08 NOTE — ED Notes (Signed)
Sister Jearld Shines [phone] would like an update
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
## 2020-09-08 NOTE — ED Notes (Signed)
Brother Marianne Sofia [phone] would like an update
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
## 2020-09-08 NOTE — ED Notes (Signed)
Pt back from CT

## 2020-09-08 NOTE — ED Notes (Signed)
[employer] at bedside with patient  

## 2020-09-09 LAB — RESP PANEL BY RT-PCR (FLU A&B, COVID) ARPGX2
Influenza A by PCR: NEGATIVE
Influenza B by PCR: NEGATIVE
SARS Coronavirus 2 by RT PCR: NEGATIVE

## 2020-09-09 MED ORDER — KETOROLAC TROMETHAMINE 60 MG/2ML IM SOLN: 60.0000 mg | Freq: Once | INTRAMUSCULAR | Status: DC

## 2020-09-09 MED ORDER — ACETAMINOPHEN 500 MG PO TABS
1000.0000 mg | ORAL_TABLET | Freq: Once | ORAL | Status: AC
  Administered 2020-09-09: 1000 mg via ORAL
  Filled 2020-09-09: qty 2

## 2020-09-09 NOTE — ED Notes (Signed)
C-Collar removed by EDP Holley Dexter.

## 2020-09-09 NOTE — ED Notes (Signed)
Patient able to ambulate from room to restroom w/out difficulty.

## 2020-09-26 ENCOUNTER — Encounter (HOSPITAL_COMMUNITY): Payer: Self-pay

## 2020-09-26 NOTE — Therapy (Signed)
[facility] [address] [place], [place], [address] Phone: [phone]   Fax:  [phone]  Patient Details  Name: Rhylee R Draper MRN: [id number] Date of Birth: 1980/11/07 Referring Provider:  No ref. provider found  Encounter Date: 09/26/2020 PHYSICAL THERAPY DISCHARGE SUMMARY  Visits from Start of Care: 2  Current functional level related to goals / functional outcomes: Unable to determine   Remaining deficits: Unable to determine   Education / Equipment: HEP initiated   Patient agrees to discharge. Patient goals were not met. Patient is being discharged due to not returning since the last visit.  1:25 PM, 09/26/20 M. Sherlyn Lees, PT, DPT Physical Therapist- [place] Office Number: [phone]   [facility] [address] [place], [place], [address] Phone: [phone]   Fax:  [phone]

## 2021-01-10 ENCOUNTER — Other Ambulatory Visit: Payer: Self-pay

## 2021-01-10 ENCOUNTER — Emergency Department (HOSPITAL_COMMUNITY)
Admission: EM | Admit: 2021-01-10 | Discharge: 2021-01-11 | Disposition: A | Discharge status: Home / Self Care | Payer: Medicaid Other | Attending: Emergency Medicine | Admitting: Emergency Medicine

## 2021-01-10 ENCOUNTER — Encounter (HOSPITAL_COMMUNITY): Payer: Self-pay

## 2021-01-10 ENCOUNTER — Emergency Department (HOSPITAL_COMMUNITY): Payer: Medicaid Other

## 2021-01-10 DIAGNOSIS — K5792 Diverticulitis of intestine, part unspecified, without perforation or abscess without bleeding: Secondary | ICD-10-CM

## 2021-01-10 DIAGNOSIS — N9489 Other specified conditions associated with female genital organs and menstrual cycle: Secondary | ICD-10-CM | POA: Insufficient documentation

## 2021-01-10 DIAGNOSIS — K5732 Diverticulitis of large intestine without perforation or abscess without bleeding: Secondary | ICD-10-CM | POA: Diagnosis not present

## 2021-01-10 DIAGNOSIS — Z7984 Long term (current) use of oral hypoglycemic drugs: Secondary | ICD-10-CM | POA: Diagnosis not present

## 2021-01-10 DIAGNOSIS — Z7982 Long term (current) use of aspirin: Secondary | ICD-10-CM | POA: Insufficient documentation

## 2021-01-10 DIAGNOSIS — Z79899 Other long term (current) drug therapy: Secondary | ICD-10-CM | POA: Insufficient documentation

## 2021-01-10 DIAGNOSIS — I10 Essential (primary) hypertension: Secondary | ICD-10-CM | POA: Insufficient documentation

## 2021-01-10 DIAGNOSIS — E119 Type 2 diabetes mellitus without complications: Secondary | ICD-10-CM | POA: Insufficient documentation

## 2021-01-10 DIAGNOSIS — R1032 Left lower quadrant pain: Secondary | ICD-10-CM | POA: Diagnosis present

## 2021-01-10 HISTORY — DX: Type 2 diabetes mellitus without complications: E11.9

## 2021-01-10 HISTORY — DX: Disorder of thyroid, unspecified: E07.9

## 2021-01-10 HISTORY — DX: Pure hypercholesterolemia, unspecified: E78.00

## 2021-01-10 HISTORY — DX: Irritable bowel syndrome without diarrhea: K58.9

## 2021-01-10 HISTORY — DX: Essential (primary) hypertension: I10

## 2021-01-10 HISTORY — DX: Irritable bowel syndrome, unspecified: K58.9

## 2021-01-10 LAB — CBC WITH DIFFERENTIAL/PLATELET
Abs Immature Granulocytes: 0.04 10*3/uL (ref 0.00–0.07)
Basophils Absolute: 0 10*3/uL (ref 0.0–0.1)
Basophils Relative: 0 %
Eosinophils Absolute: 0.2 10*3/uL (ref 0.0–0.5)
Eosinophils Relative: 2 %
HCT: 42.5 % (ref 36.0–46.0)
Hemoglobin: 14.2 g/dL (ref 12.0–15.0)
Immature Granulocytes: 0 %
Lymphocytes Relative: 31 %
Lymphs Abs: 3.8 10*3/uL (ref 0.7–4.0)
MCH: 34.1 pg — ABNORMAL HIGH (ref 26.0–34.0)
MCHC: 33.4 g/dL (ref 30.0–36.0)
MCV: 102.2 fL — ABNORMAL HIGH (ref 80.0–100.0)
Monocytes Absolute: 0.7 10*3/uL (ref 0.1–1.0)
Monocytes Relative: 5 %
Neutro Abs: 7.6 10*3/uL (ref 1.7–7.7)
Neutrophils Relative %: 62 %
Platelets: 252 10*3/uL (ref 150–400)
RBC: 4.16 MIL/uL (ref 3.87–5.11)
RDW: 13.6 % (ref 11.5–15.5)
WBC: 12.4 10*3/uL — ABNORMAL HIGH (ref 4.0–10.5)
nRBC: 0 % (ref 0.0–0.2)

## 2021-01-10 LAB — COMPREHENSIVE METABOLIC PANEL
ALT: 12 U/L (ref 0–44)
AST: 19 U/L (ref 15–41)
Albumin: 3.6 g/dL (ref 3.5–5.0)
Alkaline Phosphatase: 44 U/L (ref 38–126)
Anion gap: 8 (ref 5–15)
BUN: 12 mg/dL (ref 6–20)
CO2: 22 mmol/L (ref 22–32)
Calcium: 9.1 mg/dL (ref 8.9–10.3)
Chloride: 107 mmol/L (ref 98–111)
Creatinine, Ser: 0.69 mg/dL (ref 0.44–1.00)
GFR, Estimated: >60 mL/min (ref >60)
Glucose, Bld: 139 mg/dL — ABNORMAL HIGH (ref 70–99)
Potassium: 4.3 mmol/L (ref 3.5–5.1)
Sodium: 137 mmol/L (ref 135–145)
Total Bilirubin: 1.1 mg/dL (ref 0.3–1.2)
Total Protein: 6.5 g/dL (ref 6.5–8.1)

## 2021-01-10 LAB — URINALYSIS, ROUTINE W REFLEX MICROSCOPIC
Bilirubin Urine: NEGATIVE
Glucose, UA: NEGATIVE mg/dL
Hgb urine dipstick: NEGATIVE
Ketones, ur: NEGATIVE mg/dL
Leukocytes,Ua: NEGATIVE
Nitrite: NEGATIVE
Protein, ur: NEGATIVE mg/dL
Specific Gravity, Urine: 1.017 (ref 1.005–1.030)
pH: 5 (ref 5.0–8.0)

## 2021-01-10 LAB — I-STAT BETA HCG BLOOD, ED (MC, WL, AP ONLY): I-stat hCG, quantitative: <5 m[IU]/mL (ref <5)

## 2021-01-10 LAB — LIPASE, BLOOD: Lipase: 41 U/L (ref 11–51)

## 2021-01-10 MED ORDER — IOHEXOL 300 MG/ML  SOLN
100.0000 mL | Freq: Once | INTRAMUSCULAR | Status: AC | PRN
  Administered 2021-01-10: 100 mL via INTRAVENOUS

## 2021-01-10 NOTE — ED Triage Notes (Signed)
Abd pain LLQ non radiating tenderness on palp.  Denies n/v/d fever or cough.

## 2021-01-10 NOTE — ED Provider Triage Note (Signed)
Emergency Medicine Provider Triage Evaluation Note  Isabella Walker , a 41 y.o. female  was evaluated in triage.  Pt complains of LLQ abdominal pain that started earlier this morning. Denies nausea, vomiting, and diarrhea. No urinary or vaginal symptoms.   Review of Systems  Positive: Abdominal pain Negative: N/V/D  Physical Exam  BP (!) 116/105 (BP Location: Left Wrist)    Pulse 79    Temp 98.6 F (37 C) (Oral)    Resp 16    Ht 5\' 3"  (1.6 m)    Wt 114.8 kg    SpO2 96%    BMI 44.82 kg/m  Gen:   Awake, no distress   Resp:  Normal effort  MSK:   Moves extremities without difficulty  Other:  TTP in LLQ  Medical Decision Making  Medically screening exam initiated at 6:28 PM.  Appropriate orders placed.  Isabella Walker was informed that the remainder of the evaluation will be completed by another provider, this initial triage assessment does not replace that evaluation, and the importance of remaining in the ED until their evaluation is complete.  Abdominal labs CT abdomen   Frazier Richards, PA-C 01/10/21 1829

## 2021-01-11 ENCOUNTER — Encounter: Payer: Self-pay | Admitting: Neurology

## 2021-01-11 MED ORDER — ONDANSETRON 4 MG PO TBDP
4.0000 mg | ORAL_TABLET | Freq: Once | ORAL | Status: AC
  Administered 2021-01-11: 4 mg via ORAL
  Filled 2021-01-11: qty 1

## 2021-01-11 MED ORDER — AMOXICILLIN-POT CLAVULANATE 875-125 MG PO TABS
1.0000 | ORAL_TABLET | Freq: Once | ORAL | Status: AC
  Administered 2021-01-11: 1 via ORAL
  Filled 2021-01-11: qty 1

## 2021-01-11 MED ORDER — AMOXICILLIN-POT CLAVULANATE 875-125 MG PO TABS: 1.0000 | ORAL_TABLET | Freq: Two times a day (BID) | ORAL | 0 refills | Status: DC

## 2021-01-11 MED ORDER — OXYCODONE-ACETAMINOPHEN 5-325 MG PO TABS: 1.0000 | ORAL_TABLET | Freq: Four times a day (QID) | ORAL | 0 refills | Status: AC | PRN

## 2021-01-11 MED ORDER — OXYCODONE-ACETAMINOPHEN 5-325 MG PO TABS
1.0000 | ORAL_TABLET | Freq: Once | ORAL | Status: AC
  Administered 2021-01-11: 1 via ORAL
  Filled 2021-01-11: qty 1

## 2021-01-11 MED ORDER — ONDANSETRON 4 MG PO TBDP: 4.0000 mg | ORAL_TABLET | Freq: Three times a day (TID) | ORAL | 0 refills | Status: DC | PRN

## 2021-01-11 NOTE — ED Provider Notes (Signed)
[facility] EMERGENCY DEPARTMENT Provider Note   CSN: [id number] Arrival date & time: 01/10/21  1810     History  Chief Complaint  Patient presents with   Abdominal Pain    Isabella Walker is a 41 y.o. female with past medical history of diabetes mellitus, hypertension, status post hernia repair.  Presents emergency department with a chief plaint of abdominal pain.  States that pain started yesterday morning.  Pain is located to left lower quadrant.  Pain has been constant since its onset and progressively worsening.  Patient describes pain as "sharp or stabbing."  Patient rates pain 8/10 on the pain scale.  Pain is worse with movement or touch.  Patient has not tried any modalities to alleviate her symptoms.  Patient endorses associated nausea.  Patient reports that she has been able to drink fluids throughout the day without any difficulty or vomiting failure  Denies any fever, chills, abdominal distention, constipation, diarrhea, blood in stool, melena, dysuria, hematuria, urinary urgency, vaginal pain, vaginal bleeding, vaginal discharge.  Patient endorses rare EtOH use.  Denies any illicit drug use.  Patient reports that she has not had a menstrual period in 11 years due to IUD in place.   Abdominal Pain Associated symptoms: nausea   Associated symptoms: no chest pain, no chills, no constipation, no diarrhea, no dysuria, no fever, no hematuria, no shortness of breath, no vaginal bleeding, no vaginal discharge and no vomiting       Home Medications Prior to Admission medications   Medication Sig Start Date End Date Taking? Authorizing Provider  AIMOVIG 140 MG/ML SOAJ Inject 140 mg into the skin every 30 (thirty) days. 06/22/20   [provider]  Aspirin-Salicylamide-Caffeine (BC HEADACHE PO) Take by mouth.    [provider]  celecoxib (CELEBREX) 200 MG capsule Take 200 mg by mouth daily. 05/10/20   [provider]  cetirizine (ZYRTEC)  10 MG tablet Take 10 mg by mouth daily. 07/05/20   [provider]  DULoxetine (CYMBALTA) 30 MG capsule Take 30 mg by mouth daily. 06/21/20   [provider]  fluticasone (FLONASE) 50 MCG/ACT nasal spray Place 1 spray into both nostrils daily as needed for allergies. 07/05/20   [provider]  levothyroxine (SYNTHROID) 100 MCG tablet Take 100 mcg by mouth daily. 05/24/20   [provider]  metFORMIN (GLUCOPHAGE) 1000 MG tablet Take 1,000 mg by mouth 2 (two) times daily. 05/24/20   [provider]  montelukast (SINGULAIR) 10 MG tablet Take 10 mg by mouth daily. 04/12/20   [provider]  omeprazole (PRILOSEC) 40 MG capsule Take 40 mg by mouth daily. 03/31/20   [provider]  oxybutynin (DITROPAN) 5 MG tablet Take 5 mg by mouth 2 (two) times daily. 05/15/20   [provider]  rosuvastatin (CRESTOR) 40 MG tablet Take 40 mg by mouth every evening. 05/24/20   [provider]  topiramate (TOPAMAX) 100 MG tablet Take 150 mg by mouth in the morning and at bedtime. 06/21/20   [provider]  triamterene-hydrochlorothiazide (DYAZIDE) 37.5-25 MG capsule Take 1 capsule by mouth daily. 04/01/20   [provider]  Vitamin D, Ergocalciferol, (DRISDOL) 1.25 MG (50000 UNIT) CAPS capsule Take 50,000 Units by mouth every Sunday. 06/21/20   [provider]  zolpidem (AMBIEN) 10 MG tablet Take 10 mg by mouth at bedtime as needed for sleep. 06/21/20   [provider]      Allergies    Patient has  no known allergies.    Review of Systems   Review of Systems  Constitutional:  Negative for chills and fever.  Eyes:  Negative for visual disturbance.  Respiratory:  Negative for shortness of breath.   Cardiovascular:  Negative for chest pain.  Gastrointestinal:  Positive for abdominal pain and nausea. Negative for abdominal distention, anal bleeding, blood in stool, constipation, diarrhea, rectal pain and vomiting.   Genitourinary:  Negative for difficulty urinating, dysuria, flank pain, frequency, hematuria, urgency, vaginal bleeding, vaginal discharge and vaginal pain.  Musculoskeletal:  Negative for back pain and neck pain.  Skin:  Negative for color change and rash.  Neurological:  Negative for dizziness, syncope, light-headedness and headaches.  Psychiatric/Behavioral:  Negative for confusion.    Physical Exam Updated Vital Signs BP (!) 141/58 (BP Location: Left Arm)    Pulse 76    Temp 98.7 F (37.1 C)    Resp 20    Ht 5\' 3"  (1.6 m)    Wt 114.8 kg    SpO2 97%    BMI 44.82 kg/m  Physical Exam Vitals and nursing note reviewed.  Constitutional:      General: She is not in acute distress.    Appearance: She is morbidly obese. She is not ill-appearing, toxic-appearing or diaphoretic.  HENT:     Head: Normocephalic.  Eyes:     General: No scleral icterus.       Right eye: No discharge.        Left eye: No discharge.  Cardiovascular:     Rate and Rhythm: Normal rate.  Pulmonary:     Effort: Pulmonary effort is normal.  Abdominal:     General: Abdomen is protuberant. Bowel sounds are normal. There is no distension. There are no signs of injury.     Palpations: Abdomen is soft. There is no mass or pulsatile mass.     Tenderness: There is abdominal tenderness in the left lower quadrant. There is no guarding or rebound.     Comments: Tenderness to left lower quadrant.  Skin:    General: Skin is warm and dry.  Neurological:     General: No focal deficit present.     Mental Status: She is alert.  Psychiatric:        Behavior: Behavior is cooperative.    ED Results / Procedures / Treatments   Labs (all labs ordered are listed, but only abnormal results are displayed) Labs Reviewed  CBC WITH DIFFERENTIAL/PLATELET - Abnormal; Notable for the following components:      Result Value   WBC 12.4 (*)    MCV 102.2 (*)    MCH 34.1 (*)    All other components within normal limits  COMPREHENSIVE  METABOLIC PANEL - Abnormal; Notable for the following components:   Glucose, Bld 139 (*)    All other components within normal limits  URINALYSIS, ROUTINE W REFLEX MICROSCOPIC - Abnormal; Notable for the following components:   APPearance HAZY (*)    All other components within normal limits  LIPASE, BLOOD  I-STAT BETA HCG BLOOD, ED ([facility], [facility], [facility] ONLY)    EKG None  Radiology CT ABDOMEN PELVIS W CONTRAST  Result Date: 01/10/2021 CLINICAL DATA:  LLQ abdominal pain EXAM: CT ABDOMEN AND PELVIS WITH CONTRAST TECHNIQUE: Multidetector CT imaging of the abdomen and pelvis was performed using the standard protocol following bolus administration of intravenous contrast. CONTRAST:  132mL OMNIPAQUE IOHEXOL 300 MG/ML  SOLN COMPARISON:  None. FINDINGS: Lower chest: No acute abnormality. Hepatobiliary: No  focal liver abnormality. Calcified gallstone noted within the gallbladder lumen. No gallbladder wall thickening or pericholecystic fluid. No biliary dilatation. Pancreas: No focal lesion. Normal pancreatic contour. No surrounding inflammatory changes. No main pancreatic ductal dilatation. Spleen: Normal in size without focal abnormality. Adrenals/Urinary Tract: No adrenal nodule bilaterally. Bilateral kidneys enhance symmetrically. No hydronephrosis. No hydroureter. The urinary bladder is unremarkable. On delayed imaging, there is no urothelial wall thickening and there are no filling defects in the opacified portions of the bilateral collecting systems or ureters. Stomach/Bowel: Stomach is within normal limits. No evidence of small bowel wall thickening or dilatation. Colonic diverticulosis. No large bowel dilatation. Proximal sigmoid colon bowel wall thickening surrounding a diverticula with associated pericolonic fat stranding. No intramural abscess formation. Appendix appears normal. Vascular/Lymphatic: No abdominal aorta or iliac aneurysm. No abdominal, pelvic, or inguinal lymphadenopathy. Reproductive: T-shaped  intrauterine device within the expected region of the endometrial canal within the uterus. Uterus and bilateral adnexa are unremarkable. Other: No intraperitoneal free fluid. No intraperitoneal free gas. No organized fluid collection. Musculoskeletal: Anterior abdominal hernia repair with mesh. No suspicious lytic or blastic osseous lesions. No acute displaced fracture. IMPRESSION: 1. Colonic diverticulosis with uncomplicated proximal sigmoid acute diverticulitis. 2. Cholelithiasis with no CT finding of acute cholecystitis. 3. T-shaped intrauterine device in appropriate position. 4. Anterior abdominal wall hernia repair with mesh. Electronically Signed   By: Iven Finn M.D.   On: 01/10/2021 23:31    Procedures Procedures    Medications Ordered in ED Medications  iohexol (OMNIPAQUE) 300 MG/ML solution 100 mL (100 mLs Intravenous Contrast Given 01/10/21 2325)  amoxicillin-clavulanate (AUGMENTIN) 875-125 MG per tablet 1 tablet (1 tablet Oral Given 01/11/21 0143)  oxyCODONE-acetaminophen (PERCOCET/ROXICET) 5-325 MG per tablet 1 tablet (1 tablet Oral Given 01/11/21 0143)  ondansetron (ZOFRAN-ODT) disintegrating tablet 4 mg (4 mg Oral Given 01/11/21 0143)    ED Course/ Medical Decision Making/ A&P                           Medical Decision Making  Alert 41 year old female no acute distress, nontoxic-appearing.  Presents to ED with chief complaint of left lower quadrant abdominal pain.  Patient's past medical history of hypertension, diabetes mellitus, hernia repair (reported 3 to 4 years prior).    With tenderness to left lower quadrant concern for possible diverticulitis.  CT abdomen pelvis, CBC, CMP, lipase, urinalysis, beta hCG obtained while patient was in triage  CBC shows leukocytosis at 12.4 CMP, lipase, urinalysis, and beta-hCG are unremarkable CT abdomen pelvis shows uncomplicated proximal sigmoid acute diverticulitis.  Due to findings of diverticulitis we will start patient on  Augmentin.  Patient is afebrile without blood in stool or melena at this time.  Plan to prescribe patient with Augmentin, Percocet, and Zofran.  Patient will follow-up with PCP in 1 week for repeat examination.  Patient given strict return precautions.  Patient care and treatment plan were discussed with husband at bedside.  Discussed results, findings, treatment and follow up. Patient advised of return precautions. Patient verbalized understanding and agreed with plan.          Final Clinical Impression(s) / ED Diagnoses Final diagnoses:  Diverticulitis    Rx / DC Orders ED Discharge Orders          Ordered    oxyCODONE-acetaminophen (PERCOCET/ROXICET) 5-325 MG tablet  Every 6 hours PRN        01/11/21 0138    amoxicillin-clavulanate (AUGMENTIN) 875-125 MG tablet  Every 12  hours        01/11/21 0138    ondansetron (ZOFRAN-ODT) 4 MG disintegrating tablet  Every 8 hours PRN        01/11/21 0138              Loni Beckwith, PA-C 01/11/21 0148    Teressa Lower, MD 01/11/21 [phone]

## 2021-01-11 NOTE — Discharge Instructions (Addendum)
You came to the emergency department today to be evaluated for your abdominal pain.  The CT scan of your abdomen and pelvis showed that you have diverticulitis.  Due to this I have started you on the antibiotic Augmentin.  Please take this medication as prescribed.  I have also given you Percocet for pain management and Zofran to help with your nausea.  Please follow-up with your primary care provider in 1 week for repeat evaluation.  You may have diarrhea from the antibiotics.  It is very important that you continue to take the antibiotics even if you get diarrhea unless a medical professional tells you that you may stop taking them.  If you stop too early the bacteria you are being treated for will become stronger and you may need different, more powerful antibiotics that have more side effects and worsening diarrhea.  Please stay well hydrated and consider probiotics as they may decrease the severity of your diarrhea.  Please be aware that if you take any hormonal contraception (birth control pills, nexplanon, the ring, etc) that your birth control will not work while you are taking antibiotics and you need to use back up protection as directed on the birth control medication information insert.   Today you received medications that may make you sleepy or impair your ability to make decisions.  For the next 24 hours please do not drive, operate heavy machinery, care for a small child with out another adult present, or perform any activities that may cause harm to you or someone else if you were to fall asleep or be impaired.   You are being prescribed a medication which may make you sleepy. Please follow up of listed precautions for at least 24 hours after taking one dose.  Get help right away if: Your pain gets worse. Your symptoms do not get better with treatment. Your symptoms suddenly get worse. You have a fever. You vomit more than one time. You have stools that are bloody, black, or tarry.

## 2021-01-16 ENCOUNTER — Encounter (HOSPITAL_COMMUNITY): Payer: Self-pay

## 2021-01-18 ENCOUNTER — Other Ambulatory Visit (HOSPITAL_COMMUNITY): Payer: Self-pay | Admitting: Nurse Practitioner

## 2021-01-18 DIAGNOSIS — Z1231 Encounter for screening mammogram for malignant neoplasm of breast: Secondary | ICD-10-CM

## 2021-01-25 ENCOUNTER — Ambulatory Visit (HOSPITAL_COMMUNITY): Payer: Medicaid Other

## 2021-01-29 ENCOUNTER — Encounter: Payer: Self-pay | Admitting: Internal Medicine

## 2021-02-05 ENCOUNTER — Ambulatory Visit (HOSPITAL_COMMUNITY)
Admission: RE | Admit: 2021-02-05 | Discharge: 2021-02-05 | Disposition: A | Discharge status: Home / Self Care | Payer: Medicaid Other | Source: Ambulatory Visit | Attending: Nurse Practitioner | Admitting: Nurse Practitioner

## 2021-02-05 ENCOUNTER — Other Ambulatory Visit: Payer: Self-pay

## 2021-02-05 ENCOUNTER — Encounter (HOSPITAL_COMMUNITY): Payer: Self-pay

## 2021-02-05 DIAGNOSIS — Z1231 Encounter for screening mammogram for malignant neoplasm of breast: Secondary | ICD-10-CM | POA: Insufficient documentation

## 2021-02-07 NOTE — Progress Notes (Deleted)
NEUROLOGY CONSULTATION NOTE  Isabella Walker MRN: [id number] DOB: [birth year]  Referring provider: Royann Shivers, NP Primary care provider: Royann Shivers, NP  Reason for consult:  headache  Assessment/Plan:   ***   Subjective:  Isabella Walker is a 41 year old female with HTN, thyroid disease, DM II, IBS and anxiety who presents for headaches.  History supplemented by ED and referring provider's notes.  Onset:  ***.  Worsened in 09-May-2020, related to increased stress.  Her mother passed away in 05-10-2022.  She was in a MVC in September.  CT head and cervical spine personally reviewed revealed no trauma or other acute abnormalities.   Location:  *** Quality:  *** Intensity:  ***.  *** denies new headache, thunderclap headache or severe headache that wakes *** from sleep. Aura:  *** Prodrome:  *** Postdrome:  *** Associated symptoms:  ***.  *** denies associated unilateral numbness or weakness. Duration:  *** Frequency:  *** Frequency of abortive medication: *** Triggers:  *** Relieving factors:  *** Activity:  ***  Prior workup for headache includes MRI of brain on 12/03/2017 personally reviewed which was limited due to motion but no obvious intracranial abnormalities.    Current NSAIDS/analgesics:  BC powder, celcoxib, naproxen Current triptans:  *** Current ergotamine:  *** Current anti-emetic:  ondansetron 4mg  Current muscle relaxants:  cyclobenzaprine PRN, tizanidine Current Antihypertensive medications:  triamterene-HCTZ Current Antidepressant medications:  duloxetine 30mg  daily, sertraline 100mg  Current Anticonvulsant medications:  topiramate 150mg  BID, pregablin 200mg  TID Current anti-CGRP:  Aimovig 140mg  Current Vitamins/Herbal/Supplements:  D Current Antihistamines/Decongestants:  Zyrtec, Flonase, hydroxyzine, loratidine Other therapy:  *** Hormone/birth control:  Mirena Other medications:  levothyroxine, zolpidem  Past NSAIDS/analgesics:  Fioricet, meloxicam,  tramadol Past abortive triptans:  *** Past abortive ergotamine:  *** Past muscle relaxants:  *** Past anti-emetic:  *** Past antihypertensive medications:  *** Past antidepressant medications:  bupropion, vilazodone Past anticonvulsant medications:  gabapentin Past anti-CGRP:  *** Past vitamins/Herbal/Supplements:  *** Past antihistamines/decongestants:  *** Other past therapies:  ***  Caffeine:  *** Alcohol:  *** Smoker:  *** Diet:  *** Exercise:  *** Depression:  Yes; Anxiety:  Yes.  Referred to psychiatry.  *** Other pain:  Diabetic neuropathy.  History of back and lumbar radicular pain.  X-ray of lumbar spine on 12/20/2016 showed mild endplate spurring in the lower thoracic spine but otherwise unremarkable.  MRI of cervical and thoracic spine on 01/22/2019 personally reviewed showed mild degenerative changes in the thoracici spine with focal disc protrusion at T7-T8 contacting ventral cord with slight deformation and facet arthropathy at T9-T10 causing mild left foraminal stenosis. Sleep hygiene:  ***. Sleep apnea?  Family history of headache:  ***      PAST MEDICAL HISTORY: Past Medical History:  Diagnosis Date   Anxiety    Complication of anesthesia    Depression complicating pregnancy, postpartum May 10, 2010   Diabetes mellitus without complication (HCC)    GERD (gastroesophageal reflux disease)    Headache(784.0)    hx of migraines-last one few months ago.   High cholesterol    Hypertension    IBS (irritable bowel syndrome)    Thyroid disease     PAST SURGICAL HISTORY: Past Surgical History:  Procedure Laterality Date   CARPAL TUNNEL RELEASE Left 08/20/2018   Procedure: CARPAL TUNNEL RELEASE;  Surgeon: , MD;  Location: [facility];  Service: Orthopedics;  Laterality: Left;   INSERTION OF MESH N/A 05/25/2012   Procedure: INSERTION OF MESH;  Surgeon: 12/22/2016  Lovell Sheehan, MD;  Location: [facility];  Service: General;  Laterality: N/A;   ORIF KNEE DISLOCATION Right     VENTRAL HERNIA REPAIR N/A 05/25/2012   Procedure: LAPAROSCOPIC VENTRAL HERNIA;  Surgeon: Dalia Heading, MD;  Location: [facility];  Service: General;  Laterality: N/A;    MEDICATIONS: Current Outpatient Medications on File Prior to Visit  Medication Sig Dispense Refill   AIMOVIG 140 MG/ML SOAJ Inject 140 mg into the skin every 28 (twenty-eight) days.      AIMOVIG 140 MG/ML SOAJ Inject 140 mg into the skin every 30 (thirty) days.     amoxicillin-clavulanate (AUGMENTIN) 875-125 MG tablet Take 1 tablet by mouth every 12 (twelve) hours. 14 tablet 0   Aspirin-Salicylamide-Caffeine (BC HEADACHE PO) Take by mouth.     buPROPion (WELLBUTRIN XL) 150 MG 24 hr tablet Take 300 mg by mouth daily.     celecoxib (CELEBREX) 100 MG capsule Take 100 mg by mouth 2 (two) times daily.     celecoxib (CELEBREX) 200 MG capsule Take 200 mg by mouth daily.     cetirizine (ZYRTEC) 10 MG tablet Take 10 mg by mouth daily.     cyclobenzaprine (FLEXERIL) 5 MG tablet Take 1 tablet (5 mg total) by mouth 3 (three) times daily as needed. 30 tablet 0   DULoxetine (CYMBALTA) 30 MG capsule Take 30 mg by mouth daily.     ergocalciferol (VITAMIN D2) 1.25 MG (50000 UT) capsule Take 50,000 Units by mouth once a week.     famotidine (PEPCID) 20 MG tablet Take 1 tablet (20 mg total) by mouth 2 (two) times daily. 20 tablet 0   fluticasone (FLONASE) 50 MCG/ACT nasal spray Place 1 spray into both nostrils daily as needed for allergies.     HYDROcodone-acetaminophen (NORCO) 7.5-325 MG tablet Take 1 tablet by mouth every 6 (six) hours as needed for moderate pain.     HYDROcodone-acetaminophen (NORCO) 7.5-325 MG tablet Take 1 tablet by mouth every 4 (four) hours as needed for moderate pain. 42 tablet 0   hydrOXYzine (ATARAX/VISTARIL) 25 MG tablet Take 25 mg by mouth every 8 (eight) hours as needed for anxiety.      levonorgestrel (MIRENA, 52 MG,) 20 MCG/24HR IUD 1 each by Intrauterine route once.      levothyroxine (SYNTHROID) 100 MCG tablet  Take 100 mcg by mouth daily.     levothyroxine (SYNTHROID, LEVOTHROID) 125 MCG tablet Take 125 mcg by mouth daily before breakfast.     loratadine (CLARITIN) 10 MG tablet Take 10 mg by mouth every morning.     LYRICA 200 MG capsule Take 200 mg by mouth 3 (three) times daily.   0   metFORMIN (GLUCOPHAGE) 1000 MG tablet Take 1,000 mg by mouth 2 (two) times daily.     metFORMIN (GLUCOPHAGE) 500 MG tablet Take 500 mg by mouth every other day.     montelukast (SINGULAIR) 10 MG tablet Take 10 mg by mouth daily.     naloxegol oxalate (MOVANTIK) 25 MG TABS tablet Take 25 mg by mouth daily.     naproxen (NAPROSYN) 500 MG tablet Take 1 po BID with food prn pain 30 tablet 0   omeprazole (PRILOSEC) 20 MG capsule Take 40 mg by mouth every morning.      omeprazole (PRILOSEC) 40 MG capsule Take 40 mg by mouth daily.     ondansetron (ZOFRAN-ODT) 4 MG disintegrating tablet Take 1 tablet (4 mg total) by mouth every 8 (eight) hours as needed for nausea or vomiting.  20 tablet 0   oxybutynin (DITROPAN) 5 MG tablet Take 5 mg by mouth 2 (two) times daily.     potassium chloride SA (KLOR-CON) 20 MEQ tablet Take 1 tablet (20 mEq total) by mouth 2 (two) times daily. 4 tablet 0   rosuvastatin (CRESTOR) 10 MG tablet Take 10 mg by mouth daily.     rosuvastatin (CRESTOR) 40 MG tablet Take 40 mg by mouth every evening.     sertraline (ZOLOFT) 100 MG tablet Take 200 mg by mouth daily.   0   tiZANidine (ZANAFLEX) 4 MG tablet Take 4 mg by mouth every 8 (eight) hours as needed for muscle spasms.   0   topiramate (TOPAMAX) 100 MG tablet Take 150 mg by mouth in the morning and at bedtime.     triamterene-hydrochlorothiazide (DYAZIDE) 37.5-25 MG capsule Take 1 capsule by mouth daily.     triamterene-hydrochlorothiazide (MAXZIDE-25) 37.5-25 MG per tablet Take 1 tablet by mouth every morning.     Vitamin D, Ergocalciferol, (DRISDOL) 1.25 MG (50000 UNIT) CAPS capsule Take 50,000 Units by mouth every Sunday.     zolpidem (AMBIEN) 10 MG  tablet Take 10 mg by mouth at bedtime.     zolpidem (AMBIEN) 10 MG tablet Take 10 mg by mouth at bedtime as needed for sleep.     No current facility-administered medications on file prior to visit.    ALLERGIES: Allergies  Allergen Reactions   Bextra [Valdecoxib] Other (See Comments)    SEVERE NOSE BLEEDS    FAMILY HISTORY: Family History  Problem Relation Age of Onset   High blood pressure Mother    Diabetes Father    High blood pressure Father    Cancer Paternal Aunt        ovarian    Objective:  *** General: No acute distress.  Patient appears well-groomed.   Head:  Normocephalic/atraumatic Eyes:  fundi examined but not visualized Neck: supple, no paraspinal tenderness, full range of motion Back: No paraspinal tenderness Heart: regular rate and rhythm Lungs: Clear to auscultation bilaterally. Vascular: No carotid bruits. Neurological Exam: Mental status: alert and oriented to person, place, and time, recent and remote memory intact, fund of knowledge intact, attention and concentration intact, speech fluent and not dysarthric, language intact. Cranial nerves: CN I: not tested CN II: pupils equal, round and reactive to light, visual fields intact CN III, IV, VI:  full range of motion, no nystagmus, no ptosis CN V: facial sensation intact. CN VII: upper and lower face symmetric CN VIII: hearing intact CN IX, X: gag intact, uvula midline CN XI: sternocleidomastoid and trapezius muscles intact CN XII: tongue midline Bulk & Tone: normal, no fasciculations. Motor:  muscle strength 5/5 throughout Sensation:  Pinprick, temperature and vibratory sensation intact. Deep Tendon Reflexes:  2+ throughout,  toes downgoing.   Finger to nose testing:  Without dysmetria.   Heel to shin:  Without dysmetria.   Gait:  Normal station and stride.  Romberg negative.    Thank you for allowing me to take part in the care of this patient.  Shon MilletAdam Elvina Bosch, DO  CC: ***

## 2021-02-08 ENCOUNTER — Ambulatory Visit: Payer: Medicaid Other | Admitting: Neurology

## 2021-02-12 ENCOUNTER — Other Ambulatory Visit (HOSPITAL_COMMUNITY): Payer: Self-pay | Admitting: Nurse Practitioner

## 2021-02-12 DIAGNOSIS — N6320 Unspecified lump in the left breast, unspecified quadrant: Secondary | ICD-10-CM
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
## 2021-02-15 ENCOUNTER — Ambulatory Visit (INDEPENDENT_AMBULATORY_CARE_PROVIDER_SITE_OTHER): Payer: Medicaid Other | Admitting: Neurology

## 2021-02-15 ENCOUNTER — Other Ambulatory Visit: Payer: Self-pay

## 2021-02-15 ENCOUNTER — Encounter: Payer: Self-pay | Admitting: Neurology

## 2021-02-15 VITALS — BP 127/68 | HR 44 | Ht 63.0 in | Wt 256.8 lb

## 2021-02-15 DIAGNOSIS — G43019 Migraine without aura, intractable, without status migrainosus: Secondary | ICD-10-CM

## 2021-02-15 MED ORDER — KETOROLAC TROMETHAMINE 60 MG/2ML IM SOLN
60.0000 mg | Freq: Once | INTRAMUSCULAR | Status: AC
  Administered 2021-02-15: 60 mg via INTRAMUSCULAR

## 2021-02-15 MED ORDER — SUMATRIPTAN SUCCINATE 100 MG PO TABS: ORAL_TABLET | ORAL | 5 refills | Status: DC

## 2021-02-15 MED ORDER — ONDANSETRON HCL 4 MG PO TABS: 4.0000 mg | ORAL_TABLET | Freq: Three times a day (TID) | ORAL | 5 refills | Status: DC | PRN

## 2021-02-15 MED ORDER — AIMOVIG 140 MG/ML ~~LOC~~ SOAJ: 140.0000 mg | SUBCUTANEOUS | 5 refills | Status: DC

## 2021-02-15 NOTE — Patient Instructions (Signed)
°  Plan to start Aimovig 140mg  every 28 days (mark on calendar) Take sumatriptan 100mg  at earliest onset of headache.  May repeat dose once in 2 hours if needed.  Maximum 2 tablets in 24 hours. Ondansetron for nausea Limit use of pain relievers to no more than 2 days out of the week.  These medications include acetaminophen, NSAIDs (ibuprofen/Advil/Motrin, naproxen/Aleve, triptans (Imitrex/sumatriptan), Excedrin, and narcotics.  This will help reduce risk of rebound headaches. Be aware of common food triggers:  - Caffeine:  coffee, black tea, cola, Mt. Dew  - Chocolate  - Dairy:  aged cheeses (brie, blue, cheddar, gouda, Roff, provolone, [place], Swiss, etc), chocolate milk, buttermilk, sour cream, limit eggs and yogurt  - Nuts, peanut butter  - Alcohol  - Cereals/grains:  FRESH breads (fresh bagels, sourdough, doughnuts), yeast productions  - Processed/canned/aged/cured meats (pre-packaged deli meats, hotdogs)  - MSG/glutamate:  soy sauce, flavor enhancer, pickled/preserved/marinated foods  - Sweeteners:  aspartame (Equal, Nutrasweet).  Sugar and Splenda are okay  - Vegetables:  legumes (lima beans, lentils, snow peas, fava beans, pinto peans, peas, garbanzo beans), sauerkraut, onions, olives, pickles  - Fruit:  avocados, bananas, citrus fruit (orange, lemon, grapefruit), mango  - Other:  Frozen meals, macaroni and cheese Routine exercise Stay adequately hydrated (aim for 64 oz water daily) Keep headache diary Maintain proper stress management Maintain proper sleep hygiene Do not skip meals Consider supplements:  magnesium citrate 400mg  daily, riboflavin 400mg  daily, coenzyme Q10 100mg  three times daily.

## 2021-03-06 ENCOUNTER — Encounter (HOSPITAL_COMMUNITY): Payer: Medicaid Other

## 2021-03-06 ENCOUNTER — Encounter (HOSPITAL_COMMUNITY): Payer: Self-pay

## 2021-03-06 ENCOUNTER — Ambulatory Visit (HOSPITAL_COMMUNITY): Payer: Medicaid Other

## 2021-03-21 ENCOUNTER — Institutional Professional Consult (permissible substitution): Payer: Medicaid Other | Admitting: Neurology

## 2021-03-22 ENCOUNTER — Encounter: Payer: Self-pay | Admitting: Neurology

## 2021-05-21 NOTE — Progress Notes (Deleted)
Referring Provider: [facility] Primary Care Physician:  [facility] Primary Gastroenterologist:  Dr. Glean Salenourkr  No chief complaint on file.   HPI:   Isabella Walker Isabella Walker is a 41 y.o. female presenting today at the request of [facility] for diverticulosis.   Patient was seen in the emergency room 01/10/2021 for abdominal pain and was diagnosed with uncomplicated proximal sigmoid acute diverticulitis. Also with cholelithiasis without cholecystitis. She was discharged with Augmentin BID x7 days.   Today:    Past Medical History:  Diagnosis Date   Anxiety    Complication of anesthesia    Depression complicating pregnancy, postpartum 2012   Diabetes mellitus without complication (HCC)    GERD (gastroesophageal reflux disease)    Headache(784.0)    hx of migraines-last one few months ago.   High cholesterol    Hypertension    IBS (irritable bowel syndrome)    Thyroid disease     Past Surgical History:  Procedure Laterality Date   CARPAL TUNNEL RELEASE Left 08/20/2018   Procedure: CARPAL TUNNEL RELEASE;  Surgeon: Vickki HearingHarrison, Stanley E, MD;  Location: [facility];  Service: Orthopedics;  Laterality: Left;   INSERTION OF MESH N/A 05/25/2012   Procedure: INSERTION OF MESH;  Surgeon: Dalia HeadingMark A Jenkins, MD;  Location: [facility];  Service: General;  Laterality: N/A;   ORIF KNEE DISLOCATION Right    VENTRAL HERNIA REPAIR N/A 05/25/2012   Procedure: LAPAROSCOPIC VENTRAL HERNIA;  Surgeon: Dalia HeadingMark A Jenkins, MD;  Location: [facility];  Service: General;  Laterality: N/A;    Current Outpatient Medications  Medication Sig Dispense Refill   AIMOVIG 140 MG/ML SOAJ Inject 140 mg into the skin every 28 (twenty-eight) days. 1.12 mL 5   amoxicillin-clavulanate (AUGMENTIN) 875-125 MG tablet Take 1 tablet by mouth every 12 (twelve) hours. 14 tablet 0   Aspirin-Salicylamide-Caffeine (BC HEADACHE PO) Take by mouth.     cetirizine (ZYRTEC) 10 MG tablet Take 10 mg by mouth daily.      fluticasone (FLONASE) 50 MCG/ACT nasal spray Place 1 spray into both nostrils daily as needed for allergies.     hydrOXYzine (ATARAX/VISTARIL) 25 MG tablet Take 25 mg by mouth every 8 (eight) hours as needed for anxiety.      levonorgestrel (MIRENA) 20 MCG/24HR IUD 1 each by Intrauterine route once.      levothyroxine (SYNTHROID) 100 MCG tablet Take 100 mcg by mouth daily.     levothyroxine (SYNTHROID, LEVOTHROID) 125 MCG tablet Take 125 mcg by mouth daily before breakfast.     loratadine (CLARITIN) 10 MG tablet Take 10 mg by mouth every morning.     LYRICA 200 MG capsule Take 200 mg by mouth 3 (three) times daily.   0   metFORMIN (GLUCOPHAGE) 1000 MG tablet Take 1,000 mg by mouth 2 (two) times daily.     metFORMIN (GLUCOPHAGE) 500 MG tablet Take 500 mg by mouth every other day.     montelukast (SINGULAIR) 10 MG tablet Take 10 mg by mouth daily.     omeprazole (PRILOSEC) 20 MG capsule Take 40 mg by mouth every morning.      omeprazole (PRILOSEC) 40 MG capsule Take 40 mg by mouth daily.     ondansetron (ZOFRAN) 4 MG tablet Take 1 tablet (4 mg total) by mouth every 8 (eight) hours as needed for nausea or vomiting. 20 tablet 5   ondansetron (ZOFRAN-ODT) 4 MG disintegrating tablet Take 1 tablet (4 mg total) by mouth every 8 (eight)  hours as needed for nausea or vomiting. 20 tablet 0   oxybutynin (DITROPAN) 5 MG tablet Take 5 mg by mouth 2 (two) times daily.     rosuvastatin (CRESTOR) 10 MG tablet Take 10 mg by mouth daily.     rosuvastatin (CRESTOR) 40 MG tablet Take 40 mg by mouth every evening.     SUMAtriptan (IMITREX) 100 MG tablet Take 1 tablet earliest onset of migraine.  May repeat in 2 hours if headache persists or recurs.  Maximum 2 tablets in 24 hours 10 tablet 5   tiZANidine (ZANAFLEX) 4 MG tablet Take 4 mg by mouth every 8 (eight) hours as needed for muscle spasms.   0   topiramate (TOPAMAX) 100 MG tablet Take 150 mg by mouth in the morning and at bedtime.      triamterene-hydrochlorothiazide (DYAZIDE) 37.5-25 MG capsule Take 1 capsule by mouth daily.     triamterene-hydrochlorothiazide (MAXZIDE-25) 37.5-25 MG per tablet Take 1 tablet by mouth every morning.     Vitamin D, Ergocalciferol, (DRISDOL) 1.25 MG (50000 UNIT) CAPS capsule Take 50,000 Units by mouth every Sunday.     zolpidem (AMBIEN) 10 MG tablet Take 10 mg by mouth at bedtime.     zolpidem (AMBIEN) 10 MG tablet Take 10 mg by mouth at bedtime as needed for sleep.     No current facility-administered medications for this visit.    Allergies as of 05/23/2021 - Review Complete 02/15/2021  Allergen Reaction Noted   Bextra [valdecoxib] Other (See Comments) 05/01/2012    Family History  Problem Relation Age of Onset   High blood pressure Mother    Diabetes Father    High blood pressure Father    Cancer Paternal Aunt        ovarian    Social History   Socioeconomic History   Marital status: Married    Spouse name: Not on file   Number of children: Not on file   Years of education: Not on file   Highest education level: Not on file  Occupational History   Not on file  Tobacco Use   Smoking status: Every Day    Packs/day: 1.00    Years: 15.00    Pack years: 15.00    Types: Cigarettes    Last attempt to quit: 01/31/2017    Years since quitting: 4.3   Smokeless tobacco: Never  Vaping Use   Vaping Use: Former  Substance and Sexual Activity   Alcohol use: Never   Drug use: Never   Sexual activity: Yes    Birth control/protection: I.U.D.  Other Topics Concern   Not on file  Social History Narrative   ** Merged History Encounter **       Social Determinants of Health   Financial Resource Strain: Not on file  Food Insecurity: Not on file  Transportation Needs: Not on file  Physical Activity: Not on file  Stress: Not on file  Social Connections: Not on file  Intimate Partner Violence: Not on file    Review of Systems: Gen: Denies any fever, chills, cold or flu like  symptoms, pre-syncope, or syncope.  CV: Denies chest pain or heart palpitations.  Resp: Denies shortness of breath or cough.  GI: See HPI GU : Denies urinary burning, urinary frequency, urinary hesitancy MS: Denies joint pain. Derm: Denies rash. Psych: Denies depression, anxiety. Heme: See HPI  Physical Exam: There were no vitals taken for this visit. General:   Alert and oriented. Pleasant and cooperative. Well-nourished and  well-developed.  Head:  Normocephalic and atraumatic. Eyes:  Without icterus, sclera clear and conjunctiva pink.  Ears:  Normal auditory acuity. Lungs:  Clear to auscultation bilaterally. No wheezes, rales, or rhonchi. No distress.  Heart:  S1, S2 present without murmurs appreciated.  Abdomen:  +BS, soft, non-tender and non-distended. No HSM noted. No guarding or rebound. No masses appreciated.  Rectal:  Deferred  Msk:  Symmetrical without gross deformities. Normal posture. Extremities:  Without edema. Neurologic:  Alert and  oriented x4;  grossly normal neurologically. Skin:  Intact without significant lesions or rashes. Psych: Normal mood and affect.    Assessment:     Plan:  ***   Ermalinda Memos, PA-C [facility] 05/23/2021

## 2021-05-23 ENCOUNTER — Encounter: Payer: Self-pay | Admitting: Gastroenterology

## 2021-05-23 ENCOUNTER — Ambulatory Visit: Payer: Medicaid Other | Admitting: Gastroenterology

## 2021-08-25 ENCOUNTER — Emergency Department (HOSPITAL_COMMUNITY)
Admission: EM | Admit: 2021-08-25 | Discharge: 2021-08-25 | Disposition: A | Discharge status: Home / Self Care | Payer: Medicaid Other | Attending: Emergency Medicine | Admitting: Emergency Medicine

## 2021-08-25 ENCOUNTER — Other Ambulatory Visit: Payer: Self-pay

## 2021-08-25 ENCOUNTER — Encounter (HOSPITAL_COMMUNITY): Payer: Self-pay

## 2021-08-25 ENCOUNTER — Emergency Department (HOSPITAL_COMMUNITY): Payer: Medicaid Other

## 2021-08-25 DIAGNOSIS — R1030 Lower abdominal pain, unspecified: Secondary | ICD-10-CM

## 2021-08-25 DIAGNOSIS — Z794 Long term (current) use of insulin: Secondary | ICD-10-CM | POA: Insufficient documentation

## 2021-08-25 DIAGNOSIS — R1084 Generalized abdominal pain: Secondary | ICD-10-CM | POA: Insufficient documentation

## 2021-08-25 DIAGNOSIS — Z7982 Long term (current) use of aspirin: Secondary | ICD-10-CM | POA: Insufficient documentation

## 2021-08-25 DIAGNOSIS — R1032 Left lower quadrant pain: Secondary | ICD-10-CM | POA: Diagnosis present

## 2021-08-25 LAB — CBC WITH DIFFERENTIAL/PLATELET
Abs Immature Granulocytes: 0.02 10*3/uL (ref 0.00–0.07)
Basophils Absolute: 0 10*3/uL (ref 0.0–0.1)
Basophils Relative: 0 %
Eosinophils Absolute: 0.2 10*3/uL (ref 0.0–0.5)
Eosinophils Relative: 2 %
HCT: 44.4 % (ref 36.0–46.0)
Hemoglobin: 15 g/dL (ref 12.0–15.0)
Immature Granulocytes: 0 %
Lymphocytes Relative: 32 %
Lymphs Abs: 2.6 10*3/uL (ref 0.7–4.0)
MCH: 34.8 pg — ABNORMAL HIGH (ref 26.0–34.0)
MCHC: 33.8 g/dL (ref 30.0–36.0)
MCV: 103 fL — ABNORMAL HIGH (ref 80.0–100.0)
Monocytes Absolute: 0.4 10*3/uL (ref 0.1–1.0)
Monocytes Relative: 5 %
Neutro Abs: 4.8 10*3/uL (ref 1.7–7.7)
Neutrophils Relative %: 61 %
Platelets: 234 10*3/uL (ref 150–400)
RBC: 4.31 MIL/uL (ref 3.87–5.11)
RDW: 13.1 % (ref 11.5–15.5)
WBC: 8 10*3/uL (ref 4.0–10.5)
nRBC: 0 % (ref 0.0–0.2)

## 2021-08-25 LAB — URINALYSIS, ROUTINE W REFLEX MICROSCOPIC
Bilirubin Urine: NEGATIVE
Glucose, UA: NEGATIVE mg/dL
Hgb urine dipstick: NEGATIVE
Ketones, ur: 20 mg/dL — AB
Leukocytes,Ua: NEGATIVE
Nitrite: NEGATIVE
Protein, ur: NEGATIVE mg/dL
Specific Gravity, Urine: 1.011 (ref 1.005–1.030)
pH: 5 (ref 5.0–8.0)

## 2021-08-25 LAB — COMPREHENSIVE METABOLIC PANEL
ALT: 21 U/L (ref 0–44)
AST: 17 U/L (ref 15–41)
Albumin: 3.9 g/dL (ref 3.5–5.0)
Alkaline Phosphatase: 39 U/L (ref 38–126)
Anion gap: 8 (ref 5–15)
BUN: 15 mg/dL (ref 6–20)
CO2: 22 mmol/L (ref 22–32)
Calcium: 8.9 mg/dL (ref 8.9–10.3)
Chloride: 105 mmol/L (ref 98–111)
Creatinine, Ser: 0.73 mg/dL (ref 0.44–1.00)
GFR, Estimated: >60 mL/min (ref >60)
Glucose, Bld: 95 mg/dL (ref 70–99)
Potassium: 3.7 mmol/L (ref 3.5–5.1)
Sodium: 135 mmol/L (ref 135–145)
Total Bilirubin: 0.7 mg/dL (ref 0.3–1.2)
Total Protein: 6.9 g/dL (ref 6.5–8.1)

## 2021-08-25 LAB — LIPASE, BLOOD: Lipase: 35 U/L (ref 11–51)

## 2021-08-25 LAB — PREGNANCY, URINE: Preg Test, Ur: NEGATIVE

## 2021-08-25 MED ORDER — ONDANSETRON HCL 4 MG/2ML IJ SOLN
4.0000 mg | Freq: Once | INTRAMUSCULAR | Status: AC
  Administered 2021-08-25: 4 mg via INTRAVENOUS
  Filled 2021-08-25: qty 2

## 2021-08-25 MED ORDER — HYDROCODONE-ACETAMINOPHEN 5-325 MG PO TABS: 1.0000 | ORAL_TABLET | Freq: Four times a day (QID) | ORAL | 0 refills | Status: DC | PRN

## 2021-08-25 MED ORDER — AMOXICILLIN-POT CLAVULANATE 875-125 MG PO TABS: 1.0000 | ORAL_TABLET | Freq: Two times a day (BID) | ORAL | 0 refills | Status: DC

## 2021-08-25 MED ORDER — HYDROMORPHONE HCL 1 MG/ML IJ SOLN
0.5000 mg | Freq: Once | INTRAMUSCULAR | Status: AC
  Administered 2021-08-25: 0.5 mg via INTRAVENOUS
  Filled 2021-08-25: qty 0.5

## 2021-08-25 MED ORDER — SODIUM CHLORIDE 0.9 % IV BOLUS
500.0000 mL | Freq: Once | INTRAVENOUS | Status: AC
Start: 2021-08-25 — End: 2021-08-25
  Administered 2021-08-25: 500 mL via INTRAVENOUS

## 2021-08-25 MED ORDER — IOHEXOL 300 MG/ML  SOLN
100.0000 mL | Freq: Once | INTRAMUSCULAR | Status: AC | PRN
  Administered 2021-08-25: 100 mL via INTRAVENOUS

## 2021-08-25 NOTE — ED Provider Notes (Signed)
[facility] EMERGENCY DEPARTMENT Provider Note   CSN: [id number] Arrival date & time: 08/25/21  1559     History {Add pertinent medical, surgical, social history, OB history to HPI:1} Chief Complaint  Patient presents with   Abdominal Pain    Isabella Walker is a 41 y.o. female.  Patient complains of left lower quadrant abdominal pain.  She states it feels like her diverticulitis again   Abdominal Pain      Home Medications Prior to Admission medications   Medication Sig Start Date End Date Taking? Authorizing Provider  amoxicillin-clavulanate (AUGMENTIN) 875-125 MG tablet Take 1 tablet by mouth every 12 (twelve) hours. 08/25/21  Yes Bethann Berkshire, MD  HYDROcodone-acetaminophen (NORCO/VICODIN) 5-325 MG tablet Take 1 tablet by mouth every 6 (six) hours as needed. 08/25/21  Yes Bethann Berkshire, MD  AIMOVIG 140 MG/ML SOAJ Inject 140 mg into the skin every 28 (twenty-eight) days. 02/15/21   Drema Dallas, DO  Aspirin-Salicylamide-Caffeine (BC HEADACHE PO) Take by mouth.    [provider]  cetirizine (ZYRTEC) 10 MG tablet Take 10 mg by mouth daily. 07/05/20   [provider]  fluticasone (FLONASE) 50 MCG/ACT nasal spray Place 1 spray into both nostrils daily as needed for allergies. 07/05/20   [provider]  hydrOXYzine (ATARAX/VISTARIL) 25 MG tablet Take 25 mg by mouth every 8 (eight) hours as needed for anxiety.     [provider]  levonorgestrel (MIRENA) 20 MCG/24HR IUD 1 each by Intrauterine route once.     [provider]  levothyroxine (SYNTHROID) 100 MCG tablet Take 100 mcg by mouth daily. 05/24/20   [provider]  levothyroxine (SYNTHROID, LEVOTHROID) 125 MCG tablet Take 125 mcg by mouth daily before breakfast.    [provider]  loratadine (CLARITIN) 10 MG tablet Take 10 mg by mouth every morning.    [provider]  LYRICA 200 MG capsule Take 200 mg by mouth 3 (three) times daily.  02/03/17   [provider]  metFORMIN (GLUCOPHAGE) 1000 MG tablet Take 1,000 mg by mouth 2 (two) times daily. 05/24/20   [provider]  metFORMIN (GLUCOPHAGE) 500 MG tablet Take 500 mg by mouth every other day.    [provider]  montelukast (SINGULAIR) 10 MG tablet Take 10 mg by mouth daily. 04/12/20   [provider]  omeprazole (PRILOSEC) 20 MG capsule Take 40 mg by mouth every morning.     [provider]  omeprazole (PRILOSEC) 40 MG capsule Take 40 mg by mouth daily. 03/31/20   [provider]  ondansetron (ZOFRAN) 4 MG tablet Take 1 tablet (4 mg total) by mouth every 8 (eight) hours as needed for nausea or vomiting. 02/15/21   Everlena Cooper, Adam R, DO  ondansetron (ZOFRAN-ODT) 4 MG disintegrating tablet Take 1 tablet (4 mg total) by mouth every 8 (eight) hours as needed for nausea or vomiting. 01/11/21   Haskel Schroeder, PA-C  oxybutynin (DITROPAN) 5 MG tablet Take 5 mg by mouth 2 (two) times daily. 05/15/20   [provider]  rosuvastatin (CRESTOR) 10 MG tablet Take 10 mg by mouth daily.    [provider]  rosuvastatin (CRESTOR) 40 MG tablet Take 40 mg by mouth every evening. 05/24/20   [provider]  SUMAtriptan (IMITREX) 100 MG tablet Take 1 tablet earliest onset of migraine.  May repeat in 2 hours if headache persists or recurs.  Maximum 2 tablets in 24 hours 02/15/21   Drema Dallas, DO  tiZANidine (ZANAFLEX) 4 MG tablet Take 4 mg by mouth every 8 (eight) hours as needed for muscle spasms.  01/27/17   [provider]  topiramate (TOPAMAX) 100 MG tablet Take 150 mg by mouth in the morning and at bedtime. 06/21/20   [provider]  triamterene-hydrochlorothiazide (DYAZIDE) 37.5-25 MG capsule Take 1 capsule by mouth daily. 04/01/20   [provider]  triamterene-hydrochlorothiazide (MAXZIDE-25) 37.5-25 MG per tablet Take 1 tablet by mouth every morning.    [provider]  Vitamin D, Ergocalciferol, (DRISDOL)  1.25 MG (50000 UNIT) CAPS capsule Take 50,000 Units by mouth every Sunday. 06/21/20   [provider]  zolpidem (AMBIEN) 10 MG tablet Take 10 mg by mouth at bedtime.    [provider]  zolpidem (AMBIEN) 10 MG tablet Take 10 mg by mouth at bedtime as needed for sleep. 06/21/20   [provider]      Allergies    Bextra [valdecoxib]    Review of Systems   Review of Systems  Gastrointestinal:  Positive for abdominal pain.    Physical Exam Updated Vital Signs BP 123/61   Pulse (!) 58   Temp 98 F (36.7 C)   Resp 15   Ht 5\' 3"  (1.6 m)   Wt 96.2 kg   SpO2 91%   BMI 37.55 kg/m  Physical Exam  ED Results / Procedures / Treatments   Labs (all labs ordered are listed, but only abnormal results are displayed) Labs Reviewed  CBC WITH DIFFERENTIAL/PLATELET - Abnormal; Notable for the following components:      Result Value   MCV 103.0 (*)    MCH 34.8 (*)    All other components within normal limits  URINALYSIS, ROUTINE W REFLEX MICROSCOPIC - Abnormal; Notable for the following components:   Ketones, ur 20 (*)    All other components within normal limits  COMPREHENSIVE METABOLIC PANEL  LIPASE, BLOOD  PREGNANCY, URINE    EKG None  Radiology CT ABDOMEN PELVIS W CONTRAST  Result Date: 08/25/2021 CLINICAL DATA:  Abdominal pain. Left lower quadrant abdominal pain and dizziness. EXAM: CT ABDOMEN AND PELVIS WITH CONTRAST TECHNIQUE: Multidetector CT imaging of the abdomen and pelvis was performed using the standard protocol following bolus administration of intravenous contrast. RADIATION DOSE REDUCTION: This exam was performed according to the departmental dose-optimization program which includes automated exposure control, adjustment of the mA and/or kV according to patient size and/or use of iterative reconstruction technique. CONTRAST:  08/27/2021 OMNIPAQUE IOHEXOL 300 MG/ML  SOLN COMPARISON:  CT examination dated January 10, 2021 FINDINGS: Lower chest: No acute  abnormality. Hepatobiliary: No focal liver abnormality is seen. Rim calcified gallstone in the dependent gallbladder without gallbladder wall thickening, or biliary dilatation. Pancreas: Unremarkable. No pancreatic ductal dilatation or surrounding inflammatory changes. Spleen: Normal in size without focal abnormality. Adrenals/Urinary Tract: Adrenal glands are unremarkable. Kidneys are normal, without renal calculi, focal lesion, or hydronephrosis. Bladder is unremarkable. Stomach/Bowel: Stomach is within normal limits. Appendix appears normal. Sigmoid colonic diverticulosis without evidence of acute diverticulitis. No evidence of bowel wall thickening, distention, or inflammatory changes. Vascular/Lymphatic: No significant vascular findings are present. No enlarged abdominal or pelvic lymph nodes. Reproductive: Uterus and bilateral adnexa are unremarkable. Intrauterine contraceptive device in satisfactory position. Other: No abdominal wall hernia or abnormality. No abdominopelvic ascites. Musculoskeletal: No acute or significant osseous findings. IMPRESSION: 1. Sigmoid colonic diverticulosis without evidence of acute diverticulitis. 2. Uterus and bilateral adnexa are unremarkable. Intrauterine contraceptive device in satisfactory position. 3. Normal appendix.  4. No evidence of nephrolithiasis or hydronephrosis. 5. Cholelithiasis without evidence of acute cholecystitis or biliary ductal dilatation. Electronically Signed   By: Keane Police D.O.   On: 08/25/2021 18:29    Procedures Procedures  {Document cardiac monitor, telemetry assessment procedure when appropriate:1}  Medications Ordered in ED Medications  sodium chloride 0.9 % bolus 500 mL (0 mLs Intravenous Stopped 08/25/21 1806)  HYDROmorphone (DILAUDID) injection 0.5 mg (0.5 mg Intravenous Given 08/25/21 1632)  ondansetron (ZOFRAN) injection 4 mg (4 mg Intravenous Given 08/25/21 1632)  iohexol (OMNIPAQUE) 300 MG/ML solution 100 mL (100 mLs Intravenous  Contrast Given 08/25/21 1813)    ED Course/ Medical Decision Making/ A&P                           Medical Decision Making Amount and/or Complexity of Data Reviewed Labs: ordered. Radiology: ordered.  Risk Prescription drug management.   Patient will lower abdominal pain.  She will be treated for possible diverticulitis and follow-up with her PCP  {Document critical care time when appropriate:1} {Document review of labs and clinical decision tools ie heart score, Chads2Vasc2 etc:1}  {Document your independent review of radiology images, and any outside records:1} {Document your discussion with family members, caretakers, and with consultants:1} {Document social determinants of health affecting pt's care:1} {Document your decision making why or why not admission, treatments were needed:1} Final Clinical Impression(s) / ED Diagnoses Final diagnoses:  Lower abdominal pain    Rx / DC Orders ED Discharge Orders          Ordered    amoxicillin-clavulanate (AUGMENTIN) 875-125 MG tablet  Every 12 hours        08/25/21 1931    HYDROcodone-acetaminophen (NORCO/VICODIN) 5-325 MG tablet  Every 6 hours PRN        08/25/21 1931

## 2021-08-25 NOTE — ED Triage Notes (Signed)
Pt states she woke up this morning with LLQ pain and dizziness

## 2021-08-25 NOTE — Discharge Instructions (Signed)
Follow-up with your doctor next week for recheck.  Return if problems
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
## 2021-08-28 ENCOUNTER — Ambulatory Visit: Payer: Medicaid Other | Admitting: Neurology
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
## 2021-08-31 ENCOUNTER — Encounter: Payer: Self-pay | Admitting: Neurology

## 2021-08-31 ENCOUNTER — Telehealth (HOSPITAL_COMMUNITY): Payer: Self-pay | Admitting: Pharmacy Technician

## 2021-08-31 ENCOUNTER — Telehealth (INDEPENDENT_AMBULATORY_CARE_PROVIDER_SITE_OTHER): Payer: Medicaid Other | Admitting: Neurology

## 2021-08-31 VITALS — Ht 63.0 in | Wt 212.0 lb

## 2021-08-31 DIAGNOSIS — G43719 Chronic migraine without aura, intractable, without status migrainosus: Secondary | ICD-10-CM

## 2021-08-31 MED ORDER — EMGALITY 120 MG/ML ~~LOC~~ SOAJ: 120.0000 mg | SUBCUTANEOUS | 5 refills | Status: DC

## 2021-08-31 MED ORDER — EMGALITY 120 MG/ML ~~LOC~~ SOAJ: 240.0000 mg | Freq: Once | SUBCUTANEOUS | 0 refills | Status: AC

## 2021-08-31 NOTE — Addendum Note (Signed)
Addended by: Leida Lauth on: 08/31/2021 12:08 PM   Modules accepted: Orders

## 2021-08-31 NOTE — Patient Instructions (Signed)
Stop aimovig.  Start emgality - 2 injections for first dose, then 1 injection every 28 days thereafter Continue topiramate Use sumatripan and zofran as needed but limit to no more than 2 days out of week Stop ibuprofen, BC, Tylenol Check MRI of brain Follow up 4 months.

## 2021-08-31 NOTE — Telephone Encounter (Signed)
Patient Advocate Encounter  Prior Authorization for Emgality 120MG /ML auto-injectors (migraine) has been approved.    PA# Effective dates: 08/31/2021 through 12/01/2021      12/03/2021, CPhT Pharmacy Patient Advocate Specialist [facility] Patient Advocate Team Direct Number: [phone]  Fax: [phone]

## 2021-08-31 NOTE — Telephone Encounter (Signed)
Patient Advocate Encounter   Received notification that prior authorization for Emgality 120MG /ML auto-injectors (migraine) is required.   PA submitted on 08/31/2021 Key [id number] Status is pending       09/02/2021, CPhT Pharmacy Patient Advocate Specialist [facility] Patient Advocate Team Direct Number: [phone]  Fax: [phone]

## 2021-09-02 NOTE — Progress Notes (Unsigned)
GI Office Note    Referring Provider: Ponciano Ort, [facility] Primary Care Physician:  Ponciano Ort, [facility]  Primary Gastroenterologist:  Chief Complaint   No chief complaint on file.    History of Present Illness   Isabella Walker is a 41 y.o. female presenting today          Medications   Current Outpatient Medications  Medication Sig Dispense Refill   ACCU-CHEK GUIDE test strip      albuterol (VENTOLIN HFA) 108 (90 Base) MCG/ACT inhaler albuterol sulfate HFA 90 mcg/actuation aerosol inhaler  INHALE 2 INHALATIONS INTO THE LUNGS EVERY 4 TO 6 HOURS AS NEEDED FOR SHORTNESS OF BREATH OR WHEEZING OR CHEST TIGHTNESS     Aspirin-Salicylamide-Caffeine (BC HEADACHE PO) Take by mouth.     busPIRone (BUSPAR) 5 MG tablet Take 5 mg by mouth 2 (two) times daily.     cetirizine (ZYRTEC) 10 MG tablet Take 10 mg by mouth daily. (Patient not taking: Reported on 08/31/2021)     escitalopram (LEXAPRO) 10 MG tablet SMARTSIG:1 Tablet(s) By Mouth Every Evening     gabapentin (NEURONTIN) 100 MG capsule Take 100 mg by mouth as needed.     Galcanezumab-gnlm (EMGALITY) 120 MG/ML SOAJ Inject 120 mg into the skin every 28 (twenty-eight) days. 1.12 mL 5   HYDROcodone-acetaminophen (NORCO/VICODIN) 5-325 MG tablet Take 1 tablet by mouth every 6 (six) hours as needed. 20 tablet 0   levonorgestrel (MIRENA) 20 MCG/24HR IUD 1 each by Intrauterine route once.      metFORMIN (GLUCOPHAGE) 1000 MG tablet Take 1,000 mg by mouth 2 (two) times daily.     metFORMIN (GLUCOPHAGE) 500 MG tablet Take 500 mg by mouth every other day.     omeprazole (PRILOSEC) 20 MG capsule Take 40 mg by mouth every morning.      ondansetron (ZOFRAN) 4 MG tablet Take 1 tablet (4 mg total) by mouth every 8 (eight) hours as needed for nausea or vomiting. 20 tablet 5   ondansetron (ZOFRAN-ODT) 4 MG disintegrating tablet Take 1 tablet (4 mg total) by mouth every 8 (eight) hours as needed for nausea or vomiting. 20 tablet 0    simvastatin (ZOCOR) 40 MG tablet SMARTSIG:1 Tablet(s) By Mouth Every Evening     SPIRIVA RESPIMAT 2.5 MCG/ACT AERS SMARTSIG:2 Puff(s) Via Inhaler Daily     SUMAtriptan (IMITREX) 100 MG tablet Take 1 tablet earliest onset of migraine.  May repeat in 2 hours if headache persists or recurs.  Maximum 2 tablets in 24 hours 10 tablet 5   tiZANidine (ZANAFLEX) 4 MG tablet Take 4 mg by mouth every 8 (eight) hours as needed for muscle spasms.   0   topiramate (TOPAMAX) 100 MG tablet Take 150 mg by mouth in the morning and at bedtime.     triamterene-hydrochlorothiazide (DYAZIDE) 37.5-25 MG capsule Take 1 capsule by mouth daily.     triamterene-hydrochlorothiazide (MAXZIDE-25) 37.5-25 MG per tablet Take 1 tablet by mouth every morning.     VENTOLIN HFA 108 (90 Base) MCG/ACT inhaler SMARTSIG:2 inhalation Via Inhaler Every 6-8 Hours PRN     No current facility-administered medications for this visit.    Allergies   Allergies as of 09/03/2021 - Review Complete 08/31/2021  Allergen Reaction Noted   Bextra [valdecoxib] Other (See Comments) 05/01/2012    Past Medical History   Past Medical History:  Diagnosis Date   Anxiety    Complication of anesthesia    Depression complicating pregnancy, postpartum 2012  Diabetes mellitus without complication (HCC)    GERD (gastroesophageal reflux disease)    Headache(784.0)    hx of migraines-last one few months ago.   High cholesterol    Hypertension    IBS (irritable bowel syndrome)    Thyroid disease     Past Surgical History   Past Surgical History:  Procedure Laterality Date   CARPAL TUNNEL RELEASE Left 08/20/2018   Procedure: CARPAL TUNNEL RELEASE;  Surgeon: Vickki Hearing, MD;  Location: [facility];  Service: Orthopedics;  Laterality: Left;   INSERTION OF MESH N/A 05/25/2012   Procedure: INSERTION OF MESH;  Surgeon: Dalia Heading, MD;  Location: [facility];  Service: General;  Laterality: N/A;   ORIF KNEE DISLOCATION Right    VENTRAL HERNIA  REPAIR N/A 05/25/2012   Procedure: LAPAROSCOPIC VENTRAL HERNIA;  Surgeon: Dalia Heading, MD;  Location: [facility];  Service: General;  Laterality: N/A;    Past Family History   Family History  Problem Relation Age of Onset   High blood pressure Mother    Diabetes Father    High blood pressure Father    Cancer Paternal Aunt        ovarian    Past Social History   Social History   Socioeconomic History   Marital status: Married    Spouse name: Not on file   Number of children: Not on file   Years of education: Not on file   Highest education level: Not on file  Occupational History   Not on file  Tobacco Use   Smoking status: Every Day    Packs/day: 1.00    Years: 15.00    Total pack years: 15.00    Types: Cigarettes    Last attempt to quit: 01/31/2017    Years since quitting: 4.5   Smokeless tobacco: Never  Vaping Use   Vaping Use: Former  Substance and Sexual Activity   Alcohol use: Never   Drug use: Never   Sexual activity: Yes    Birth control/protection: I.U.D.  Other Topics Concern   Not on file  Social History Narrative   ** Merged History Encounter **       Social Determinants of Health   Financial Resource Strain: Not on file  Food Insecurity: Not on file  Transportation Needs: Not on file  Physical Activity: Not on file  Stress: Not on file  Social Connections: Not on file  Intimate Partner Violence: Not on file    Review of Systems   General: Negative for anorexia, weight loss, fever, chills, fatigue, weakness. Eyes: Negative for vision changes.  ENT: Negative for hoarseness, difficulty swallowing , nasal congestion. CV: Negative for chest pain, angina, palpitations, dyspnea on exertion, peripheral edema.  Respiratory: Negative for dyspnea at rest, dyspnea on exertion, cough, sputum, wheezing.  GI: See history of present illness. GU:  Negative for dysuria, hematuria, urinary incontinence, urinary frequency, nocturnal urination.  MS: Negative  for joint pain, low back pain.  Derm: Negative for rash or itching.  Neuro: Negative for weakness, abnormal sensation, seizure, frequent headaches, memory loss,  confusion.  Psych: Negative for anxiety, depression, suicidal ideation, hallucinations.  Endo: Negative for unusual weight change.  Heme: Negative for bruising or bleeding. Allergy: Negative for rash or hives.  Physical Exam   There were no vitals taken for this visit.   General: Well-nourished, well-developed in no acute distress.  Head: Normocephalic, atraumatic.   Eyes: Conjunctiva pink, no icterus. Mouth: Oropharyngeal mucosa moist and pink ,  no lesions erythema or exudate. Neck: Supple without thyromegaly, masses, or lymphadenopathy.  Lungs: Clear to auscultation bilaterally.  Heart: Regular rate and rhythm, no murmurs rubs or gallops.  Abdomen: Bowel sounds are normal, nontender, nondistended, no hepatosplenomegaly or masses,  no abdominal bruits or hernia, no rebound or guarding.   Rectal: *** Extremities: No lower extremity edema. No clubbing or deformities.  Neuro: Alert and oriented x 4 , grossly normal neurologically.  Skin: Warm and dry, no rash or jaundice.   Psych: Alert and cooperative, normal mood and affect.  Labs   Lab Results  Component Value Date   LIPASE 35 08/25/2021   Lab Results  Component Value Date   CREATININE 0.73 08/25/2021   BUN 15 08/25/2021   NA 135 08/25/2021   K 3.7 08/25/2021   CL 105 08/25/2021   CO2 22 08/25/2021   Lab Results  Component Value Date   ALT 21 08/25/2021   AST 17 08/25/2021   ALKPHOS 39 08/25/2021   BILITOT 0.7 08/25/2021   Lab Results  Component Value Date   WBC 8.0 08/25/2021   HGB 15.0 08/25/2021   HCT 44.4 08/25/2021   MCV 103.0 (H) 08/25/2021   PLT 234 08/25/2021    Imaging Studies   CT ABDOMEN PELVIS W CONTRAST  Result Date: 08/25/2021 CLINICAL DATA:  Abdominal pain. Left lower quadrant abdominal pain and dizziness. EXAM: CT ABDOMEN AND  PELVIS WITH CONTRAST TECHNIQUE: Multidetector CT imaging of the abdomen and pelvis was performed using the standard protocol following bolus administration of intravenous contrast. RADIATION DOSE REDUCTION: This exam was performed according to the departmental dose-optimization program which includes automated exposure control, adjustment of the mA and/or kV according to patient size and/or use of iterative reconstruction technique. CONTRAST:  OMNIPAQUE IOHEXOL 300 MG/ML  SOLN COMPARISON:  CT examination dated January 10, 2021 FINDINGS: Lower chest: No acute abnormality. Hepatobiliary: No focal liver abnormality is seen. Rim calcified gallstone in the dependent gallbladder without gallbladder wall thickening, or biliary dilatation. Pancreas: Unremarkable. No pancreatic ductal dilatation or surrounding inflammatory changes. Spleen: Normal in size without focal abnormality. Adrenals/Urinary Tract: Adrenal glands are unremarkable. Kidneys are normal, without renal calculi, focal lesion, or hydronephrosis. Bladder is unremarkable. Stomach/Bowel: Stomach is within normal limits. Appendix appears normal. Sigmoid colonic diverticulosis without evidence of acute diverticulitis. No evidence of bowel wall thickening, distention, or inflammatory changes. Vascular/Lymphatic: No significant vascular findings are present. No enlarged abdominal or pelvic lymph nodes. Reproductive: Uterus and bilateral adnexa are unremarkable. Intrauterine contraceptive device in satisfactory position. Other: No abdominal wall hernia or abnormality. No abdominopelvic ascites. Musculoskeletal: No acute or significant osseous findings. IMPRESSION: 1. Sigmoid colonic diverticulosis without evidence of acute diverticulitis. 2. Uterus and bilateral adnexa are unremarkable. Intrauterine contraceptive device in satisfactory position. 3. Normal appendix. 4. No evidence of nephrolithiasis or hydronephrosis. 5. Cholelithiasis without evidence of acute  cholecystitis or biliary ductal dilatation. Electronically Signed   By: Larose Hires D.O.   On: 08/25/2021 18:29    Assessment       PLAN   ***   Leanna Battles. Melvyn Neth, MHS, PA-C [facility]

## 2021-09-03 ENCOUNTER — Encounter: Payer: Self-pay | Admitting: Gastroenterology

## 2021-09-03 ENCOUNTER — Ambulatory Visit (INDEPENDENT_AMBULATORY_CARE_PROVIDER_SITE_OTHER): Payer: Medicaid Other | Admitting: Gastroenterology

## 2021-09-03 VITALS — BP 122/83 | HR 84 | Temp 97.3°F | Ht 63.0 in | Wt 207.2 lb

## 2021-09-03 DIAGNOSIS — R109 Unspecified abdominal pain: Secondary | ICD-10-CM | POA: Insufficient documentation

## 2021-09-03 DIAGNOSIS — K581 Irritable bowel syndrome with constipation: Secondary | ICD-10-CM | POA: Diagnosis not present

## 2021-09-03 DIAGNOSIS — R1032 Left lower quadrant pain: Secondary | ICD-10-CM | POA: Diagnosis not present

## 2021-09-03 DIAGNOSIS — R110 Nausea: Secondary | ICD-10-CM | POA: Diagnosis not present

## 2021-09-03 DIAGNOSIS — R634 Abnormal weight loss: Secondary | ICD-10-CM

## 2021-09-03 DIAGNOSIS — K219 Gastro-esophageal reflux disease without esophagitis: Secondary | ICD-10-CM

## 2021-09-03 DIAGNOSIS — K5900 Constipation, unspecified: Secondary | ICD-10-CM | POA: Insufficient documentation

## 2021-09-03 MED ORDER — LINACLOTIDE 290 MCG PO CAPS: 290.0000 ug | ORAL_CAPSULE | Freq: Every day | ORAL | 5 refills | Status: DC

## 2021-09-03 NOTE — Patient Instructions (Signed)
We will review labs from your PCP. Start omeprazole daily as soon as you get your medication. Start Linzess daily on empty stomach for constipation. May cause watery stools at first and you could have several stools a day. If you have more than 4 stools in a day, skip the medication the following day. The longer you are on the medication the more regular your bowels become and less abdominal pain. Touch base with [place] in the next 5-7 days and let me know if your abdominal pain resolves after better management of your constipation. Call sooner if your pain worsens. You will need a colonoscopy once we get you through this episode of abdominal pain. Would also consider upper endoscopy at the same time to evaluate your reflux, nausea, and weight loss.

## 2021-09-13 ENCOUNTER — Encounter (HOSPITAL_BASED_OUTPATIENT_CLINIC_OR_DEPARTMENT_OTHER): Payer: Self-pay

## 2021-09-13 DIAGNOSIS — R0683 Snoring: Secondary | ICD-10-CM

## 2021-09-13 DIAGNOSIS — G4710 Hypersomnia, unspecified: Secondary | ICD-10-CM

## 2021-09-15 ENCOUNTER — Telehealth: Payer: Self-pay | Admitting: Gastroenterology

## 2021-09-15 NOTE — Telephone Encounter (Signed)
Reviewed labs from PCP dated 05/2021: creatinine 0.71, a1C 5.3, TSH 1.950  Please find out if patient's abdominal pain has resolved and if bowels moving better.   IF abdominal pain resolved, would recommend moving forward with colonoscopy and upper endoscopy if she is doing better. Dx: reflux, nausea, weight loss, abnormal colon on CT/diverticulitis.   Given possible recent diverticulitis, procedure should be no sooner than 3rd week in sept. ASA 2 Day of prep: AM dose of metformin AM of prep: hold metformin Topamax hold for one week before procedures.

## 2021-09-16 ENCOUNTER — Ambulatory Visit
Admission: RE | Admit: 2021-09-16 | Discharge: 2021-09-16 | Disposition: A | Discharge status: Home / Self Care | Payer: Medicaid Other | Source: Ambulatory Visit | Attending: Neurology | Admitting: Neurology

## 2021-09-16 ENCOUNTER — Other Ambulatory Visit: Payer: Self-pay | Admitting: Neurology

## 2021-09-16 DIAGNOSIS — G43719 Chronic migraine without aura, intractable, without status migrainosus: Secondary | ICD-10-CM

## 2021-09-17 NOTE — Telephone Encounter (Signed)
Lmom for pt to return my call.  

## 2021-09-18 NOTE — Telephone Encounter (Signed)
Lmom for pt to return my call.  

## 2021-09-20 NOTE — Telephone Encounter (Signed)
No ans, no vm.  

## 2021-09-24 NOTE — Telephone Encounter (Signed)
Unable to reach patient. Sending letter to have patient call the office.

## 2021-09-25 ENCOUNTER — Encounter (HOSPITAL_COMMUNITY): Payer: Self-pay

## 2021-09-25 ENCOUNTER — Other Ambulatory Visit: Payer: Self-pay

## 2021-09-25 ENCOUNTER — Emergency Department (HOSPITAL_COMMUNITY)
Admission: EM | Admit: 2021-09-25 | Discharge: 2021-09-25 | Disposition: A | Discharge status: Home / Self Care | Payer: Medicaid Other | Attending: Emergency Medicine | Admitting: Emergency Medicine

## 2021-09-25 ENCOUNTER — Emergency Department (HOSPITAL_COMMUNITY): Payer: Medicaid Other

## 2021-09-25 DIAGNOSIS — K5732 Diverticulitis of large intestine without perforation or abscess without bleeding: Secondary | ICD-10-CM | POA: Diagnosis not present

## 2021-09-25 DIAGNOSIS — I10 Essential (primary) hypertension: Secondary | ICD-10-CM | POA: Insufficient documentation

## 2021-09-25 DIAGNOSIS — Z79899 Other long term (current) drug therapy: Secondary | ICD-10-CM | POA: Diagnosis not present

## 2021-09-25 DIAGNOSIS — E119 Type 2 diabetes mellitus without complications: Secondary | ICD-10-CM | POA: Insufficient documentation

## 2021-09-25 DIAGNOSIS — Z7984 Long term (current) use of oral hypoglycemic drugs: Secondary | ICD-10-CM | POA: Insufficient documentation

## 2021-09-25 DIAGNOSIS — K5792 Diverticulitis of intestine, part unspecified, without perforation or abscess without bleeding: Secondary | ICD-10-CM | POA: Diagnosis not present

## 2021-09-25 DIAGNOSIS — R1032 Left lower quadrant pain: Secondary | ICD-10-CM | POA: Diagnosis present

## 2021-09-25 DIAGNOSIS — R109 Unspecified abdominal pain: Secondary | ICD-10-CM | POA: Diagnosis not present

## 2021-09-25 LAB — URINALYSIS, ROUTINE W REFLEX MICROSCOPIC
Bilirubin Urine: NEGATIVE
Glucose, UA: NEGATIVE mg/dL
Ketones, ur: NEGATIVE mg/dL
Leukocytes,Ua: NEGATIVE
Nitrite: NEGATIVE
Protein, ur: NEGATIVE mg/dL
Specific Gravity, Urine: 1.004 — ABNORMAL LOW (ref 1.005–1.030)
pH: 6 (ref 5.0–8.0)

## 2021-09-25 LAB — COMPREHENSIVE METABOLIC PANEL
ALT: 13 U/L (ref 0–44)
AST: 11 U/L — ABNORMAL LOW (ref 15–41)
Albumin: 3.5 g/dL (ref 3.5–5.0)
Alkaline Phosphatase: 39 U/L (ref 38–126)
Anion gap: 8 (ref 5–15)
BUN: 9 mg/dL (ref 6–20)
CO2: 20 mmol/L — ABNORMAL LOW (ref 22–32)
Calcium: 8.7 mg/dL — ABNORMAL LOW (ref 8.9–10.3)
Chloride: 108 mmol/L (ref 98–111)
Creatinine, Ser: 0.54 mg/dL (ref 0.44–1.00)
GFR, Estimated: >60 mL/min (ref >60)
Glucose, Bld: 152 mg/dL — ABNORMAL HIGH (ref 70–99)
Potassium: 3.4 mmol/L — ABNORMAL LOW (ref 3.5–5.1)
Sodium: 136 mmol/L (ref 135–145)
Total Bilirubin: 1 mg/dL (ref 0.3–1.2)
Total Protein: 6.6 g/dL (ref 6.5–8.1)

## 2021-09-25 LAB — CBC
HCT: 41.3 % (ref 36.0–46.0)
Hemoglobin: 13.6 g/dL (ref 12.0–15.0)
MCH: 34.1 pg — ABNORMAL HIGH (ref 26.0–34.0)
MCHC: 32.9 g/dL (ref 30.0–36.0)
MCV: 103.5 fL — ABNORMAL HIGH (ref 80.0–100.0)
Platelets: 204 10*3/uL (ref 150–400)
RBC: 3.99 MIL/uL (ref 3.87–5.11)
RDW: 12.8 % (ref 11.5–15.5)
WBC: 11.1 10*3/uL — ABNORMAL HIGH (ref 4.0–10.5)
nRBC: 0 % (ref 0.0–0.2)

## 2021-09-25 LAB — LIPASE, BLOOD: Lipase: 27 U/L (ref 11–51)

## 2021-09-25 LAB — PREGNANCY, URINE: Preg Test, Ur: NEGATIVE

## 2021-09-25 MED ORDER — NAPROXEN 500 MG PO TABS: 500.0000 mg | ORAL_TABLET | Freq: Two times a day (BID) | ORAL | 0 refills | Status: DC

## 2021-09-25 MED ORDER — IOHEXOL 300 MG/ML  SOLN
100.0000 mL | Freq: Once | INTRAMUSCULAR | Status: AC | PRN
  Administered 2021-09-25: 100 mL via INTRAVENOUS

## 2021-09-25 MED ORDER — ONDANSETRON HCL 4 MG/2ML IJ SOLN
4.0000 mg | Freq: Once | INTRAMUSCULAR | Status: AC
  Administered 2021-09-25: 4 mg via INTRAVENOUS
  Filled 2021-09-25: qty 2

## 2021-09-25 MED ORDER — HYDROMORPHONE HCL 1 MG/ML IJ SOLN
0.5000 mg | Freq: Once | INTRAMUSCULAR | Status: AC
  Administered 2021-09-25: 0.5 mg via INTRAVENOUS
  Filled 2021-09-25: qty 0.5

## 2021-09-25 MED ORDER — AMOXICILLIN-POT CLAVULANATE 875-125 MG PO TABS: 1.0000 | ORAL_TABLET | Freq: Three times a day (TID) | ORAL | 0 refills | Status: AC

## 2021-09-25 NOTE — ED Triage Notes (Signed)
Pt presents to ED with complaints of left lower abdominal pain started last night. Pt with history of diverticulitis. Pt denies V/D but states she feels nauseated

## 2021-09-25 NOTE — ED Provider Triage Note (Signed)
Emergency Medicine Provider Triage Evaluation Note  Isabella [name] , a 41 y.o. female  was evaluated in triage.  Pt complains of lower abdominal pain, mostly left-sided that began last evening.  Describes pain as constant and associated with nausea.  No vomiting or diarrhea.  Pain was sudden in onset.  History of hernia repair several years ago.  No prior bowel obstruction  Review of Systems  Positive: Abdominal pain, nausea Negative: Vomiting, fever, dysuria, flank pain  Physical Exam  BP (!) 142/114 (BP Location: Right Arm)   Pulse 64   Temp 97.8 F (36.6 C) (Oral)   Resp 18   Ht 5\' 3"  (1.6 m)   Wt 93.9 kg   LMP  (LMP Unknown)   SpO2 93%   BMI 36.67 kg/m  Gen:   Awake, patient tearful Resp:  Normal effort  MSK:   Moves extremities without difficulty  Other:  Diffuse lower abdominal pain, left greater than right Medical Decision Making  Medically screening exam initiated at 6:19 PM.  Appropriate orders placed.  Isabella [name] was informed that the remainder of the evaluation will be completed by another provider, this initial triage assessment does not replace that evaluation, and the importance of remaining in the ED until their evaluation is complete.  Patient here for evaluation of abdominal pain x1 day.  Pain has been associated with nausea.  Patient noted to have history of diverticulitis.  Exam concerning for recurrence.  She will need further evaluation in the emergency department, she is agreeable to this plan.   Kem Parkinson, PA-C 09/25/21 1850

## 2021-09-25 NOTE — ED Notes (Signed)
Patient transported to CT 

## 2021-09-25 NOTE — ED Provider Notes (Signed)
[facility] EMERGENCY DEPARTMENT Provider Note   CSN: [id number] Arrival date & time: 09/25/21  1752     History  Chief Complaint  [facility] [name] presents with   Abdominal Pain    Isabella Isabella [name] is a 41 y.o. female.   Abdominal Pain  Isabella [name] is a 41 year old female with past medical history of hypertension, diabetes, diverticulitis presents to [facility] emergency department for evaluation of left lower quadrant pain.  [facility]abella [name] states [facility] pain woke her up this morning.  It is located on Isabella left lower quadrant.  Isabella Isabella [name] described [facility]abella pain as constant and sharp.  [facility]abella [name] has similar pain about a month ago when she was evaluated here for diverticulosis.  [facility] [name] status time Is[facility] pain is worse.  [facility] [name] reports nausea without vomiting.  She reports seeing blood on her wipes after bowel movement but not in her stool.  She states that she has been constipated recently.  Denies any urinary symptoms, chest pain, shortness of breath.  Home Medications Prior to Admission medications   Medication Sig Start Date End Date Taking? Authorizing Provider  ACCU-CHEK GUIDE test strip  08/15/21   [provider]  albuterol (VENTOLIN HFA) 108 (90 Base) MCG/ACT inhaler albuterol sulfate HFA 90 mcg/actuation aerosol inhaler  INHALE 2 INHALATIONS INTO [facility] LUNGS EVERY 4 TO 6 HOURS AS NEEDED FOR SHORTNESS OF BREATH OR WHEEZING OR CHEST TIGHTNESS    [provider]  busPIRone (BUSPAR) 10 MG tablet Take 10 mg by mouth 2 (two) times daily. 06/19/21   [provider]  escitalopram (LEXAPRO) 20 MG tablet Take 20 mg by mouth daily. 06/07/21   [provider]  gabapentin (NEURONTIN) 100 MG capsule Take 100 mg by mouth as needed. 06/26/21   [provider]  Galcanezumab-gnlm (EMGALITY) 120 MG/ML SOAJ Inject 120 mg into [facility] skin every 28 (twenty-eight) days. 08/31/21   [facility] Isabella [name]  levonorgestrel (MIRENA) 20 MCG/24HR IUD 1 each by Intrauterine route once.     [provider]   linaclotide [name]) 290 MCG CAPS capsule Take 1 capsule (290 mcg total) by mouth daily before breakfast. Failed miralax 09/03/21   [facility] Is[facility] [name]  metFORMIN (GLUCOPHAGE) 1000 MG tablet Take 1,000 mg by mouth 2 (two) times daily. 05/24/20   [provider]  omeprazole (PRILOSEC) 20 MG capsule Take 40 mg by mouth every morning.     [provider]  ondansetron (ZOFRAN) 4 MG tablet Take 1 tablet (4 mg total) by mouth every 8 (eight) hours as needed for nausea or vomiting. 02/15/21   [facility] Isabella [name]  simvastatin (ZOCOR) 40 MG tablet SMARTSIG:1 Tablet(s) By Mouth Every Evening 06/07/21   [provider]  SPIRIVA RESPIMAT 2.5 MCG/ACT AERS SMARTSIG:2 Puff(s) Via Inhaler Daily 08/17/21   [provider]  SUMAtriptan (IMITREX) 100 MG tablet Take 1 tablet earliest onset of migraine.  May repeat in 2 hours if headache persists or recurs.  Maximum 2 tablets in 24 hours 02/15/21   [facility] Isabella [name]  topiramate (TOPAMAX) 100 MG tablet Take 150 mg by mouth in [facility] morning and at bedtime. 06/21/20   [provider]  triamterene-hydrochlorothiazide (DYAZIDE) 37.5-25 MG capsule Take 1 capsule by mouth daily. 04/01/20   [provider]      Allergies    Bextra [valdecoxib]    Review of Systems   Review of Systems  Gastrointestinal:  Positive for abdominal pain.    Physical Exam Updated Vital Signs BP (!) 142/114 (BP Location: Right  Arm)   Pulse 64   Temp 97.8 F (36.6 C) (Oral)   Resp 18   Ht 5\' 3"  (1.6 m)   Wt 93.9 kg   LMP  (LMP Unknown)   SpO2 93%   BMI 36.67 kg/m  Physical Exam Vitals and nursing note reviewed.  Constitutional:      Appearance: Normal appearance.  HENT:     Head: Normocephalic and atraumatic.     Mouth/Throat:     Mouth: Mucous membranes are moist.  Eyes:     General: No scleral icterus. Cardiovascular:     Rate and Rhythm: Normal rate and regular rhythm.     Pulses: Normal pulses.     Heart sounds: Normal heart  sounds.  Pulmonary:     Effort: Pulmonary effort is normal.     Breath sounds: Normal breath sounds.  Abdominal:     General: Abdomen is flat. Bowel sounds are normal.     Tenderness: There is generalized abdominal tenderness and tenderness in [facility] right upper quadrant, right lower quadrant, left upper quadrant and left lower quadrant.  Musculoskeletal:        General: No deformity.  Skin:    General: Skin is warm.     Findings: No rash.  Neurological:     General: No focal deficit present.     Mental Status: She is alert.  Psychiatric:        Mood and Affect: Mood normal.     ED Results / Procedures / Treatments   Labs (all labs ordered are listed, but only abnormal results are displayed) Labs Reviewed  COMPREHENSIVE METABOLIC PANEL - Abnormal; Notable for [facility] following components:      Result Value   Potassium 3.4 (*)    CO2 20 (*)    Glucose, Bld 152 (*)    Calcium 8.7 (*)    AST 11 (*)    All other components within normal limits  CBC - Abnormal; Notable for [facility] following components:   WBC 11.1 (*)    MCV 103.5 (*)    MCH 34.1 (*)    All other components within normal limits  URINALYSIS, ROUTINE W REFLEX MICROSCOPIC - Abnormal; Notable for [facility] following components:   Specific Gravity, Urine 1.004 (*)    Hgb urine dipstick SMALL (*)    Bacteria, UA RARE (*)    All other components within normal limits  LIPASE, BLOOD  PREGNANCY, URINE    EKG None  Radiology CT ABDOMEN PELVIS W CONTRAST  Result Date: 09/25/2021 CLINICAL DATA:  Left lower quadrant pain. EXAM: CT ABDOMEN AND PELVIS WITH CONTRAST TECHNIQUE: Multidetector CT imaging of [facility] abdomen and pelvis was performed using [facility] standard protocol following bolus administration of intravenous contrast. RADIATION DOSE REDUCTION: This exam was performed according to [facility] departmental dose-optimization program which includes automated exposure control, adjustment of the mA and/or kV according to [facility] [name] size and/or use  of iterative reconstruction technique. CONTRAST:  100mL OMNIPAQUE IOHEXOL 300 MG/ML  SOLN COMPARISON:  August 25, 2021 FINDINGS: Lower chest: No acute abnormality. Hepatobiliary: No focal liver abnormality is seen. Multiple ill-defined gallstones are seen within [facility] lumen of a moderately distended gallbladder. There is no evidence of gallbladder wall thickening, pericholecystic inflammation or biliary dilatation. Pancreas: Unremarkable. No pancreatic ductal dilatation or surrounding inflammatory changes. Spleen: Normal in size without focal abnormality. Adrenals/Urinary Tract: Adrenal glands are unremarkable. Kidneys are normal, without renal calculi, focal lesion, or hydronephrosis. [facility] urinary bladder is poorly distended and subsequently limited in evaluation.  Stomach/Bowel: Stomach is within normal limits. Appendix appears normal. No evidence of bowel dilatation. Mild to moderately inflamed diverticula are seen within [facility] proximal sigmoid colon. There is no evidence of associated perforation or abscess. Vascular/Lymphatic: No significant vascular findings are present. No enlarged abdominal or pelvic lymph nodes. Reproductive: An IUD is properly positioned within an otherwise normal appearing uterus. [facility] bilateral adnexa are unremarkable. Other: Multiple surgical coils are seen along [facility] anterior aspect of [facility] lower abdomen and pelvis. No abdominal wall hernia or abnormality. No abdominopelvic ascites. Musculoskeletal: No acute or significant osseous findings. IMPRESSION: 1. Sigmoid diverticulitis. 2. Cholelithiasis. Electronically Signed   By: Aram Candela M.D.   On: 09/25/2021 20:49    Procedures Procedures    Medications Ordered in ED Medications  HYDROmorphone (DILAUDID) injection 0.5 mg (0.5 mg Intravenous Given 09/25/21 1919)  ondansetron (ZOFRAN) injection 4 mg (4 mg Intravenous Given 09/25/21 2013)  iohexol (OMNIPAQUE) 300 MG/ML solution 100 mL (100 mLs Intravenous Contrast Given 09/25/21 2035)     ED Course/ Medical Decision Making/ A&P                           Medical Decision Making Amount and/or Complexity of Data Reviewed Labs: ordered.  Risk Prescription drug management.   This [facility] [name] presents to [facility] ED for concern of left lower abdominal pain., this involves an extensive number of treatment options, and is a complaint that carries with it a high risk of complications and morbidity.  [facility] differential diagnosis includes diverticulosis, diverticulitis, appendicitis, ectopic pregnancy.   Co morbidities that complicate [facility] [name] evaluation  Diverticulosis   Additional history obtained:  Additional history obtained from EMR External records from outside source obtained and reviewed including EMR.   Lab Tests:  I Ordered, and personally interpreted labs.  [facility] pertinent results include: CBC with potassium 3.4, glucose 152, negative urine pregnancy test   Imaging Studies ordered:  I ordered imaging studies including CT abdomen pelvis with contrast I independently visualized and interpreted imaging which showed sigmoid diverticulitis and cholelithiasis I agree with [facility] radiologist interpretation   Cardiac Monitoring: / EKG:  [facility] Is[facility] [name] was maintained on a cardiac monitor.  I personally viewed and interpreted [facility] cardiac monitored which showed an underlying rhythm of: Normal sinus   Consultations Obtained:  I requested consultation with [facility] n/a,  and discussed lab and imaging findings as well as pertinent plan - they recommend: n/a   Problem List / ED Course / Critical interventions / Medication management  [facility] [name] is a 41 year old female with past medical history of diverticulitis presents for evaluation of left lower abdominal pain.  On examination Isabella [name] is afebrile.  She has tenderness to palpation on [facility] left lower quadrant, vital signs stable. [facility] [name] seems to be in a lot of pain so Dilaudid 0.5 mg was given. [facility] [name] was reassessed.  She states  her pain has improved.  CT scan shows signs of diverticulitis.  Urine pregnancy test was negative.  She will be treated with Augmentin for 14 days.  [facility] [name] is safe to be discharged with naproxen for pain.  Advised [facility] [name] to return to [facility] ER for worsening symptoms.  All question answered.  [facility] [name] is agreeable with [facility] plan. I ordered medication including Dilaudid 0.5 mg injection for abdominal pain Reevaluation of [facility] Is[facility] [name] after these medicines showed that [facility] Is[facility] [name] improved I have reviewed [facility] patients home medicines and have made adjustments as needed   Social Determinants of Health:  N/a  Test / Admission - Considered:  none         Final Clinical Impression(s) / ED Diagnoses Final diagnoses:  None    Rx / DC Orders ED Discharge Orders     None         Rex Kras, [place] 09/25/21 2320    Noemi Chapel, MD 09/26/21 1434

## 2021-09-25 NOTE — Discharge Instructions (Addendum)
Please take Augmentin as prescribed and naproxen for pain. Return to the emergency department if symptoms do not improve.

## 2021-10-08 ENCOUNTER — Telehealth: Payer: Self-pay | Admitting: *Deleted

## 2021-10-08 NOTE — Telephone Encounter (Signed)
We had been trying to get in touch with patient over the past few weeks without response.   Reviewed CT A/P with contrast dated 09/25/21: sigmoid diverticulitis. She was given Augment TID for 14 days.   If significant pain, then she may have to go back to ED for reevaluation to see if needs admission.   Otherwise, would recommend in person visit for reevaluation here. There are couple of nonurgent follow up spots available Tuesday and Wednesday.

## 2021-10-08 NOTE — Telephone Encounter (Signed)
Pt says she went to the ER on 09/25/21 for real bad stomach pain. She says a CT was done and was told that her diverticulitis was really inflamed. She was put on 2 weeks of antibiotics. She says she is still in a lot of pain. Please advise. Thank you

## 2021-10-08 NOTE — Telephone Encounter (Signed)
Pt was scheduled for Wed at 2:30. Pt is aware and was advised to go to ER if pain was significant, pt verbalized understanding.

## 2021-10-10 ENCOUNTER — Encounter: Payer: Self-pay | Admitting: Gastroenterology

## 2021-10-10 ENCOUNTER — Ambulatory Visit (INDEPENDENT_AMBULATORY_CARE_PROVIDER_SITE_OTHER): Payer: Medicaid Other | Admitting: Gastroenterology

## 2021-10-10 VITALS — BP 111/60 | HR 63 | Temp 97.5°F | Ht 63.0 in | Wt 207.6 lb

## 2021-10-10 DIAGNOSIS — K5732 Diverticulitis of large intestine without perforation or abscess without bleeding: Secondary | ICD-10-CM | POA: Diagnosis not present

## 2021-10-10 DIAGNOSIS — K219 Gastro-esophageal reflux disease without esophagitis: Secondary | ICD-10-CM

## 2021-10-10 DIAGNOSIS — R634 Abnormal weight loss: Secondary | ICD-10-CM

## 2021-10-10 MED ORDER — AMOXICILLIN-POT CLAVULANATE 875-125 MG PO TABS: 1.0000 | ORAL_TABLET | Freq: Two times a day (BID) | ORAL | 0 refills | Status: AC

## 2021-10-10 MED ORDER — OMEPRAZOLE 40 MG PO CPDR: 40.0000 mg | DELAYED_RELEASE_CAPSULE | Freq: Every day | ORAL | 5 refills | Status: DC

## 2021-10-10 NOTE — Patient Instructions (Signed)
I sent in rx for omeprazole 40mg , take one capsule daily before breakfast. Once you complete current rx for augmentin, you will take one more week of augmentin twice daily with food (new rx sent today).  We will schedule you for colonoscopy and upper endoscopy with Dr. Abbey Chatters for 5-6 weeks from now. It is important that you let me know if you are having persistent abdominal pain.

## 2021-10-10 NOTE — Progress Notes (Signed)
GI Office Note    Referring Provider: Alanson Puls, [facility] Primary Care Physician:  [facility]  Primary Gastroenterologist: Elon Alas. Abbey Chatters, DO   Chief Complaint   Chief Complaint  Patient presents with   ER Follow up    ER follow up for diverticulitis, states that she still has a little bit of abdominal pain.    History of Present Illness   Isabella Walker is a 41 y.o. female presenting today for ED follow-up for diverticulitis.  She was seen in the office back in August for abdominal pain.  Has a history of uncomplicated diverticulitis back in January 2023, CT documented.  CT in August 2023 with recurrent abdominal pain showed no evidence of acute diverticulitis.  She also has a history of IBS with constipation.  Intermittent bright red blood per rectum with straining.  Over the past year she has had a lot of issues with unintentional weight loss, nausea, decreased appetite but no vomiting.  Frequent heartburn.  History of migraine headaches, poorly controlled.  She had recurrent acute onset LLQ pain that prompted ED visit 09/25/21. Pain similar to prior episode of diverticulitis.   In ED: Urine pregnancy test negative, urinalysis with small hemoglobin, rare bacteria, white blood cell count 11,100, hemoglobin 13.6, MCV 103.5, platelets 204,000, total bilirubin 1, alk phos 39, AST 11, ALT 13, albumin 3.5, lipase 27.  CT A/P with contrast 09/25/21: sigmoid diverticulitis. Cholelithiasis.  She is on her last day of Augmentin. She has developed a vaginal yeast infection and starts diflucan today. She notes her abdominal pain is much better, notes only with bending over, movement. Has some pain today. BM every other day, stools are loose on Augmentin. No melena, brbpr. No vomiting. She is having a lot of heartburn again. Out of PPI for awhile. No dysphagia.   She has had a substantial amount of weight loss, well-documented.  Down over 100 pounds since August 2020.   Has lost almost 75 pounds in the past 1 year.   August 2020: 318 pounds August 2021: 300 pounds September 2022: 280 pounds February 2023: 256 pounds August 2023: 207 pounds Today: 207 pounds       Medications   Current Outpatient Medications  Medication Sig Dispense Refill   albuterol (VENTOLIN HFA) 108 (90 Base) MCG/ACT inhaler albuterol sulfate HFA 90 mcg/actuation aerosol inhaler  INHALE 2 INHALATIONS INTO THE LUNGS EVERY 4 TO 6 HOURS AS NEEDED FOR SHORTNESS OF BREATH OR WHEEZING OR CHEST TIGHTNESS     amoxicillin-clavulanate (AUGMENTIN) 500-125 MG tablet Take 1 tablet by mouth every 8 (eight) hours.     busPIRone (BUSPAR) 10 MG tablet Take 10 mg by mouth 2 (two) times daily.     escitalopram (LEXAPRO) 20 MG tablet Take 20 mg by mouth daily.     gabapentin (NEURONTIN) 100 MG capsule Take 100 mg by mouth 3 (three) times daily as needed (as needed for anxiety).     Galcanezumab-gnlm (EMGALITY) 120 MG/ML SOAJ Inject 120 mg into the skin every 28 (twenty-eight) days. 1.12 mL 5   levonorgestrel (MIRENA) 20 MCG/24HR IUD 1 each by Intrauterine route once.      metFORMIN (GLUCOPHAGE) 1000 MG tablet Take 1,000 mg by mouth 2 (two) times daily.     simvastatin (ZOCOR) 40 MG tablet SMARTSIG:1 Tablet(s) By Mouth Every Evening     SPIRIVA RESPIMAT 2.5 MCG/ACT AERS SMARTSIG:2 Puff(s) Via Inhaler Daily     SUMAtriptan (IMITREX) 100 MG tablet Take 1  tablet earliest onset of migraine.  May repeat in 2 hours if headache persists or recurs.  Maximum 2 tablets in 24 hours 10 tablet 5   triamterene-hydrochlorothiazide (DYAZIDE) 37.5-25 MG capsule Take 1 capsule by mouth daily.     ACCU-CHEK GUIDE test strip  (Patient not taking: Reported on 10/10/2021)     omeprazole (PRILOSEC) 20 MG capsule Take 40 mg by mouth every morning.  (Patient not taking: Reported on 10/10/2021)     No current facility-administered medications for this visit.    Allergies   Allergies as of 10/10/2021 - Review Complete  10/10/2021  Allergen Reaction Noted   Bextra [valdecoxib] Other (See Comments) 05/01/2012     Past Medical History   Past Medical History:  Diagnosis Date   Anxiety    Complication of anesthesia    Depression complicating pregnancy, postpartum 2012   Diabetes mellitus without complication ([place])    GERD (gastroesophageal reflux disease)    Headache(784.0)    hx of migraines-last one few months ago.   High cholesterol    Hypertension    IBS (irritable bowel syndrome)    Migraines    Thyroid disease     Past Surgical History   Past Surgical History:  Procedure Laterality Date   CARPAL TUNNEL RELEASE Left 08/20/2018   Procedure: CARPAL TUNNEL RELEASE;  Surgeon: Carole Civil, MD;  Location: [facility];  Service: Orthopedics;  Laterality: Left;   INSERTION OF MESH N/A 05/25/2012   Procedure: INSERTION OF MESH;  Surgeon: Jamesetta So, MD;  Location: [facility];  Service: General;  Laterality: N/A;   ORIF KNEE DISLOCATION Right    VENTRAL HERNIA REPAIR N/A 05/25/2012   Procedure: LAPAROSCOPIC VENTRAL HERNIA;  Surgeon: Jamesetta So, MD;  Location: [facility];  Service: General;  Laterality: N/A;    Past Family History   Family History  Problem Relation Age of Onset   High blood pressure Mother    Diverticulitis Mother        had colostomy   Diabetes Father    High blood pressure Father    Diverticulitis Maternal Grandmother        had to have colon surgery   Cancer Paternal Aunt        ovarian   Colon cancer Neg Hx     Past Social History   Social History   Socioeconomic History   Marital status: Married    Spouse name: Not on file   Number of children: Not on file   Years of education: Not on file   Highest education level: Not on file  Occupational History   Not on file  Tobacco Use   Smoking status: Every Day    Packs/day: 1.00    Years: 15.00    Total pack years: 15.00    Types: Cigarettes    Last attempt to quit: 01/31/2017    Years since quitting: 4.6    Smokeless tobacco: Never  Vaping Use   Vaping Use: Former  Substance and Sexual Activity   Alcohol use: Never   Drug use: Never   Sexual activity: Yes    Birth control/protection: I.U.D.  Other Topics Concern   Not on file  Social History Narrative   ** Merged History Encounter **       Social Determinants of Health   Financial Resource Strain: Not on file  Food Insecurity: Not on file  Transportation Needs: Not on file  Physical Activity: Not on file  Stress: Not on file  Social Connections: Not on file  Intimate Partner Violence: Not on file    Review of Systems   General: Negative for anorexia, weight loss, fever, chills, fatigue, weakness. See hpi ENT: Negative for hoarseness, difficulty swallowing , nasal congestion. CV: Negative for chest pain, angina, palpitations, dyspnea on exertion, peripheral edema.  Respiratory: Negative for dyspnea at rest, dyspnea on exertion, cough, sputum, wheezing.  GI: See history of present illness. GU:  Negative for dysuria, hematuria, urinary incontinence, urinary frequency, nocturnal urination. See hpi Endo: Negative for unusual weight change.     Physical Exam   BP 111/60 (BP Location: Right Arm, Patient Position: Sitting, Cuff Size: Large)   Pulse 63   Temp (!) 97.5 F (36.4 C) (Oral)   Ht 5' 3"  (1.6 m)   Wt 207 lb 9.6 oz (94.2 kg)   LMP  (LMP Unknown)   SpO2 98%   BMI 36.77 kg/m    General: Well-nourished, well-developed in no acute distress.  Eyes: No icterus. Mouth: Oropharyngeal mucosa moist and pink , no lesions erythema or exudate. Lungs: Clear to auscultation bilaterally.  Heart: Regular rate and rhythm, no murmurs rubs or gallops.  Abdomen: Bowel sounds are normal, nondistended, no hepatosplenomegaly or masses,  no abdominal bruits or hernia , no rebound or guarding. Mild LLQ tenderness Rectal: not performed Extremities: No lower extremity edema. No clubbing or deformities. Neuro: Alert and oriented x 4   Skin:  Warm and dry, no jaundice.   Psych: Alert and cooperative, normal mood and affect.  Labs   See hpi   05/2021: creatinine 0.71, a1C 5.3, TSH 1.950  Imaging Studies   CT ABDOMEN PELVIS W CONTRAST  Result Date: 09/25/2021 CLINICAL DATA:  Left lower quadrant pain. EXAM: CT ABDOMEN AND PELVIS WITH CONTRAST TECHNIQUE: Multidetector CT imaging of the abdomen and pelvis was performed using the standard protocol following bolus administration of intravenous contrast. RADIATION DOSE REDUCTION: This exam was performed according to the departmental dose-optimization program which includes automated exposure control, adjustment of the mA and/or kV according to patient size and/or use of iterative reconstruction technique. CONTRAST:  138m OMNIPAQUE IOHEXOL 300 MG/ML  SOLN COMPARISON:  August 25, 2021 FINDINGS: Lower chest: No acute abnormality. Hepatobiliary: No focal liver abnormality is seen. Multiple ill-defined gallstones are seen within the lumen of a moderately distended gallbladder. There is no evidence of gallbladder wall thickening, pericholecystic inflammation or biliary dilatation. Pancreas: Unremarkable. No pancreatic ductal dilatation or surrounding inflammatory changes. Spleen: Normal in size without focal abnormality. Adrenals/Urinary Tract: Adrenal glands are unremarkable. Kidneys are normal, without renal calculi, focal lesion, or hydronephrosis. The urinary bladder is poorly distended and subsequently limited in evaluation. Stomach/Bowel: Stomach is within normal limits. Appendix appears normal. No evidence of bowel dilatation. Mild to moderately inflamed diverticula are seen within the proximal sigmoid colon. There is no evidence of associated perforation or abscess. Vascular/Lymphatic: No significant vascular findings are present. No enlarged abdominal or pelvic lymph nodes. Reproductive: An IUD is properly positioned within an otherwise normal appearing uterus. The bilateral adnexa are  unremarkable. Other: Multiple surgical coils are seen along the anterior aspect of the lower abdomen and pelvis. No abdominal wall hernia or abnormality. No abdominopelvic ascites. Musculoskeletal: No acute or significant osseous findings. IMPRESSION: 1. Sigmoid diverticulitis. 2. Cholelithiasis. Electronically Signed   By: TVirgina NorfolkM.D.   On: 09/25/2021 20:49   MR BRAIN WO CONTRAST  Result Date: 09/17/2021 CLINICAL DATA:  Daily headaches for 6 months. EXAM: MRI HEAD WITHOUT CONTRAST TECHNIQUE: Multiplanar,  multiecho pulse sequences of the brain and surrounding structures were obtained without intravenous contrast. COMPARISON:  Head MRI 12/03/2017 and head CT 09/08/2020 FINDINGS: Multiple attempts at obtaining IV access were unsuccessful, and a noncontrast MRI was performed. Brain: There is no evidence of an acute infarct, intracranial hemorrhage, mass, midline shift, or extra-axial fluid collection. The ventricles and sulci are normal. The cerebellar tonsils are normally positioned. The brain is normal in signal. The pituitary gland is normal in size. Vascular: Major intracranial vascular flow voids are preserved. Skull and upper cervical spine: Unremarkable bone marrow signal. Sinuses/Orbits: Unremarkable orbits. Minimal mucosal thickening in the paranasal sinuses. Small left mastoid effusion. Other: None. IMPRESSION: Unremarkable noncontrast appearance of the brain. IV access could not be obtained. Electronically Signed   By: Logan Bores M.D.   On: 09/17/2021 17:20    Assessment   Acute diverticulitis: Documented uncomplicated sigmoid diverticulitis back in 01/2021 and last month. CT in 08/2021 without inflammation although she was still having some LLQ pain. At time of last visit, it was suspected that constipation could be contributing to her persistent pain. She was started on Linzess. We had planned colonoscopy after last ov but had difficulty reaching patient. Recent acute LLQ pain again with  CT showing acute diverticulitis. She is completing day 14 and doing much better. Some mild persistent LLQ pain, worse with movement.   Weight loss/nausea/poor appetite: well documented significant weight loss. Symptoms for one year. Thyroid status normal. Plan for colonoscopy/upper endoscopy. If unremarkable, she may require testing for adrenal insufficiency if no explanation for her symptoms.   GERD: typical symptoms off PPI. Restart PPI. It is not clear if her nausea is related.    PLAN   Omeprazole 71m daily, rx sent. Additional week of augmentin, 875/1237mBID. If persistent abdominal pain she will let me know. Plan for EGD/colonoscopy in 5-6 weeks. ASA 2.  I have discussed the risks, alternatives, benefits with regards to but not limited to the risk of reaction to medication, bleeding, infection, perforation and the patient is agreeable to proceed. Written consent to be obtained.   LeLaureen OchsLeBobby RumpfMHRock IslandPABellerive Acresastroenterology Associates

## 2021-10-11 ENCOUNTER — Telehealth: Payer: Self-pay | Admitting: *Deleted

## 2021-10-11 NOTE — Telephone Encounter (Signed)
LMOVM to call back to schedule TCS/EGD, asa 2, NO SOONER THAN 5-6 WEEKS. Needs BMET and urine preg

## 2021-10-15 ENCOUNTER — Other Ambulatory Visit: Payer: Self-pay

## 2021-10-15 ENCOUNTER — Telehealth: Payer: Self-pay | Admitting: Neurology

## 2021-10-15 MED ORDER — TOPIRAMATE 100 MG PO TABS: 150.0000 mg | ORAL_TABLET | Freq: Two times a day (BID) | ORAL | 3 refills | Status: DC

## 2021-10-15 NOTE — Telephone Encounter (Signed)
1. Which medications need refilled? (List name and dosage, if known) [facility] - She also stated she never received a prescription for Topamax either  2. Which pharmacy/location is medication to be sent to? (include street and city if local pharmacy) [facility] [address]. Linna Hoff

## 2021-10-15 NOTE — Telephone Encounter (Signed)
Called Pt and phone is out of service at this time. Prescription for Topamax 15o mg has been sent to [facility]. [facility] has refills.

## 2021-10-19 ENCOUNTER — Encounter: Payer: Self-pay | Admitting: *Deleted

## 2021-10-19 NOTE — Telephone Encounter (Signed)
Number not in service. Will mail letter

## 2021-10-23 DIAGNOSIS — E119 Type 2 diabetes mellitus without complications: Secondary | ICD-10-CM | POA: Diagnosis not present

## 2021-10-23 DIAGNOSIS — E7849 Other hyperlipidemia: Secondary | ICD-10-CM | POA: Diagnosis not present

## 2021-10-26 ENCOUNTER — Emergency Department (HOSPITAL_COMMUNITY): Payer: 59

## 2021-10-26 ENCOUNTER — Encounter (HOSPITAL_COMMUNITY): Payer: Self-pay

## 2021-10-26 ENCOUNTER — Emergency Department (HOSPITAL_COMMUNITY)
Admission: EM | Admit: 2021-10-26 | Discharge: 2021-10-26 | Disposition: A | Discharge status: Home / Self Care | Payer: 59 | Attending: Emergency Medicine | Admitting: Emergency Medicine

## 2021-10-26 ENCOUNTER — Other Ambulatory Visit: Payer: Self-pay

## 2021-10-26 DIAGNOSIS — R61 Generalized hyperhidrosis: Secondary | ICD-10-CM | POA: Insufficient documentation

## 2021-10-26 DIAGNOSIS — R1012 Left upper quadrant pain: Secondary | ICD-10-CM | POA: Diagnosis not present

## 2021-10-26 DIAGNOSIS — R1010 Upper abdominal pain, unspecified: Secondary | ICD-10-CM | POA: Diagnosis not present

## 2021-10-26 DIAGNOSIS — R748 Abnormal levels of other serum enzymes: Secondary | ICD-10-CM | POA: Diagnosis not present

## 2021-10-26 DIAGNOSIS — R1011 Right upper quadrant pain: Secondary | ICD-10-CM | POA: Insufficient documentation

## 2021-10-26 DIAGNOSIS — R1013 Epigastric pain: Secondary | ICD-10-CM | POA: Diagnosis not present

## 2021-10-26 DIAGNOSIS — R110 Nausea: Secondary | ICD-10-CM | POA: Diagnosis not present

## 2021-10-26 DIAGNOSIS — R1084 Generalized abdominal pain: Secondary | ICD-10-CM | POA: Diagnosis not present

## 2021-10-26 DIAGNOSIS — R52 Pain, unspecified: Secondary | ICD-10-CM | POA: Diagnosis not present

## 2021-10-26 DIAGNOSIS — Z7984 Long term (current) use of oral hypoglycemic drugs: Secondary | ICD-10-CM | POA: Diagnosis not present

## 2021-10-26 DIAGNOSIS — I10 Essential (primary) hypertension: Secondary | ICD-10-CM | POA: Diagnosis not present

## 2021-10-26 HISTORY — DX: Post-traumatic stress disorder, unspecified: F43.10

## 2021-10-26 HISTORY — DX: Bipolar disorder, unspecified: F31.9

## 2021-10-26 LAB — COMPREHENSIVE METABOLIC PANEL
ALT: 13 U/L (ref 0–44)
AST: 13 U/L — ABNORMAL LOW (ref 15–41)
Albumin: 3.8 g/dL (ref 3.5–5.0)
Alkaline Phosphatase: 36 U/L — ABNORMAL LOW (ref 38–126)
Anion gap: 8 (ref 5–15)
BUN: 23 mg/dL — ABNORMAL HIGH (ref 6–20)
CO2: 22 mmol/L (ref 22–32)
Calcium: 8.8 mg/dL — ABNORMAL LOW (ref 8.9–10.3)
Chloride: 107 mmol/L (ref 98–111)
Creatinine, Ser: 0.58 mg/dL (ref 0.44–1.00)
GFR, Estimated: >60 mL/min (ref >60)
Glucose, Bld: 96 mg/dL (ref 70–99)
Potassium: 3.8 mmol/L (ref 3.5–5.1)
Sodium: 137 mmol/L (ref 135–145)
Total Bilirubin: 0.6 mg/dL (ref 0.3–1.2)
Total Protein: 6.9 g/dL (ref 6.5–8.1)

## 2021-10-26 LAB — CBC
HCT: 41.1 % (ref 36.0–46.0)
Hemoglobin: 13.8 g/dL (ref 12.0–15.0)
MCH: 34.6 pg — ABNORMAL HIGH (ref 26.0–34.0)
MCHC: 33.6 g/dL (ref 30.0–36.0)
MCV: 103 fL — ABNORMAL HIGH (ref 80.0–100.0)
Platelets: 209 10*3/uL (ref 150–400)
RBC: 3.99 MIL/uL (ref 3.87–5.11)
RDW: 12.6 % (ref 11.5–15.5)
WBC: 7.1 10*3/uL (ref 4.0–10.5)
nRBC: 0 % (ref 0.0–0.2)

## 2021-10-26 LAB — POC URINE PREG, ED: Preg Test, Ur: NEGATIVE

## 2021-10-26 LAB — URINALYSIS, ROUTINE W REFLEX MICROSCOPIC
Bilirubin Urine: NEGATIVE
Glucose, UA: NEGATIVE mg/dL
Hgb urine dipstick: NEGATIVE
Ketones, ur: NEGATIVE mg/dL
Leukocytes,Ua: NEGATIVE
Nitrite: NEGATIVE
Protein, ur: NEGATIVE mg/dL
Specific Gravity, Urine: 1.011 (ref 1.005–1.030)
pH: 6 (ref 5.0–8.0)

## 2021-10-26 LAB — LIPASE, BLOOD: Lipase: 66 U/L — ABNORMAL HIGH (ref 11–51)

## 2021-10-26 LAB — TROPONIN I (HIGH SENSITIVITY)
Troponin I (High Sensitivity): 2 ng/L (ref <18)
Troponin I (High Sensitivity): 2 ng/L (ref <18)

## 2021-10-26 MED ORDER — PANTOPRAZOLE SODIUM 40 MG IV SOLR
40.0000 mg | Freq: Once | INTRAVENOUS | Status: AC
  Administered 2021-10-26: 40 mg via INTRAVENOUS
  Filled 2021-10-26: qty 10

## 2021-10-26 MED ORDER — ALUM & MAG HYDROXIDE-SIMETH 200-200-20 MG/5ML PO SUSP: 30.0000 mL | Freq: Once | ORAL | Status: DC

## 2021-10-26 MED ORDER — PANTOPRAZOLE SODIUM 20 MG PO TBEC: 20.0000 mg | DELAYED_RELEASE_TABLET | Freq: Every day | ORAL | 0 refills | Status: DC

## 2021-10-26 MED ORDER — SUCRALFATE 1 G PO TABS: 1.0000 g | ORAL_TABLET | Freq: Three times a day (TID) | ORAL | 0 refills | Status: DC

## 2021-10-26 MED ORDER — ONDANSETRON HCL 4 MG/2ML IJ SOLN
4.0000 mg | Freq: Once | INTRAMUSCULAR | Status: AC
  Administered 2021-10-26: 4 mg via INTRAVENOUS
  Filled 2021-10-26: qty 2

## 2021-10-26 NOTE — ED Triage Notes (Signed)
Pt arrived via ems.  Reports started having abd pain around 7am.  Says started suddenly.  Reports initially was a sharp pain but now describes as a squeezing pain.  Reports became diaphoretic, weak all over, and nauseated.  Denies any vomiting.

## 2021-10-26 NOTE — ED Provider Notes (Signed)
[facility] EMERGENCY DEPARTMENT Provider Note   CSN: [id number] Arrival date & time: 10/26/21  0848     History  Chief Complaint  Patient presents with   Abdominal Pain    Isabella Walker is a 41 y.o. female. With past medical history of bipolar, DM, HTN, HLD, IBS, GERD who presents to the emergency department with abdominal pain.  States began about 1 hour ago while driving her son to school.  She describes it as constant, sharp, squeezing, epigastric abdominal pain that is non-radiating.  Has associated cold sweats and nausea.  Denies vomiting.  She denies chest pain or shortness of breath.  Denies dysuria vaginal discharge, melena or hematochezia.  She does have a history of diverticulitis but states this does not feel similar.  Denies chronic alcohol or ibuprofen use, however she states that she takes [place] powder almost daily for headache.   Abdominal Pain Associated symptoms: nausea   Associated symptoms: no chest pain, no diarrhea, no dysuria, no shortness of breath, no vaginal discharge and no vomiting        Home Medications Prior to Admission medications   Medication Sig Start Date End Date Taking? Authorizing Provider  pantoprazole (PROTONIX) 20 MG tablet Take 1 tablet (20 mg total) by mouth daily. 10/26/21  Yes Mickie Hillier, PA-C  sucralfate (CARAFATE) 1 g tablet Take 1 tablet (1 g total) by mouth 4 (four) times daily -  with meals and at bedtime. 10/26/21 11/25/21 Yes Mickie Hillier, PA-C  ACCU-CHEK GUIDE test strip  08/15/21   [provider]  albuterol (VENTOLIN HFA) 108 (90 Base) MCG/ACT inhaler albuterol sulfate HFA 90 mcg/actuation aerosol inhaler  INHALE 2 INHALATIONS INTO THE LUNGS EVERY 4 TO 6 HOURS AS NEEDED FOR SHORTNESS OF BREATH OR WHEEZING OR CHEST TIGHTNESS    [provider]  busPIRone (BUSPAR) 10 MG tablet Take 10 mg by mouth 2 (two) times daily. 06/19/21   [provider]  escitalopram (LEXAPRO) 20 MG tablet Take 20 mg  by mouth daily. 06/07/21   [provider]  gabapentin (NEURONTIN) 100 MG capsule Take 100 mg by mouth 3 (three) times daily as needed (as needed for anxiety). 06/26/21   [provider]  Galcanezumab-gnlm (EMGALITY) 120 MG/ML SOAJ Inject 120 mg into the skin every 28 (twenty-eight) days. 08/31/21   Pieter Partridge, DO  levonorgestrel (MIRENA) 20 MCG/24HR IUD 1 each by Intrauterine route once.     [provider]  metFORMIN (GLUCOPHAGE) 1000 MG tablet Take 1,000 mg by mouth 2 (two) times daily. 05/24/20   [provider]  simvastatin (ZOCOR) 40 MG tablet SMARTSIG:1 Tablet(s) By Mouth Every Evening 06/07/21   [provider]  SPIRIVA RESPIMAT 2.5 MCG/ACT AERS SMARTSIG:2 Puff(s) Via Inhaler Daily 08/17/21   [provider]  SUMAtriptan (IMITREX) 100 MG tablet Take 1 tablet earliest onset of migraine.  May repeat in 2 hours if headache persists or recurs.  Maximum 2 tablets in 24 hours 02/15/21   Pieter Partridge, DO  topiramate (TOPAMAX) 100 MG tablet Take 1.5 tablets (150 mg total) by mouth 2 (two) times daily. 10/15/21   Pieter Partridge, DO  triamterene-hydrochlorothiazide (DYAZIDE) 37.5-25 MG capsule Take 1 capsule by mouth daily. 04/01/20   [provider]      Allergies    Bextra [valdecoxib]    Review of Systems   Review of Systems  Constitutional:  Positive for diaphoresis.  Respiratory:  Negative for shortness of breath.   Cardiovascular:  Negative for chest pain and palpitations.  Gastrointestinal:  Positive for abdominal pain and nausea. Negative for diarrhea and vomiting.  Genitourinary:  Negative for dysuria and vaginal discharge.  All other systems reviewed and are negative.   Physical Exam Updated Vital Signs BP 95/64   Pulse (!) 58   Temp 97.8 F (36.6 C) (Oral)   Resp (!) 22   Ht 5\' 3"  (1.6 m)   Wt 93.9 kg   LMP  (LMP Unknown)   SpO2 97%   BMI 36.67 kg/m  Physical Exam Vitals and nursing note reviewed.  Constitutional:       General: She is not in acute distress.    Appearance: Normal appearance. She is obese. She is ill-appearing. She is not toxic-appearing or diaphoretic.  HENT:     Head: Normocephalic and atraumatic.     Mouth/Throat:     Mouth: Mucous membranes are moist.     Pharynx: Oropharynx is clear.  Eyes:     General: No scleral icterus.    Extraocular Movements: Extraocular movements intact.  Cardiovascular:     Rate and Rhythm: Normal rate and regular rhythm.     Heart sounds: Normal heart sounds. No murmur heard. Pulmonary:     Effort: Pulmonary effort is normal. No respiratory distress.     Breath sounds: Normal breath sounds.  Abdominal:     General: Abdomen is protuberant. Bowel sounds are normal. There is no distension.     Palpations: Abdomen is soft.     Tenderness: There is abdominal tenderness in the right upper quadrant, epigastric area and left upper quadrant. There is guarding. There is no right CVA tenderness, left CVA tenderness or rebound. Positive signs include Murphy's sign.  Musculoskeletal:        General: Normal range of motion.     Cervical back: Neck supple.  Skin:    General: Skin is warm and dry.     Capillary Refill: Capillary refill takes less than 2 seconds.  Neurological:     General: No focal deficit present.     Mental Status: She is alert and oriented to person, place, and time. Mental status is at baseline.  Psychiatric:        Mood and Affect: Mood normal.        Behavior: Behavior normal.        Thought Content: Thought content normal.        Judgment: Judgment normal.    ED Results / Procedures / Treatments   Labs (all labs ordered are listed, but only abnormal results are displayed) Labs Reviewed  LIPASE, BLOOD - Abnormal; Notable for the following components:      Result Value   Lipase 66 (*)    All other components within normal limits  COMPREHENSIVE METABOLIC PANEL - Abnormal; Notable for the following components:   BUN 23 (*)     Calcium 8.8 (*)    AST 13 (*)    Alkaline Phosphatase 36 (*)    All other components within normal limits  CBC - Abnormal; Notable for the following components:   MCV 103.0 (*)    MCH 34.6 (*)    All other components within normal limits  URINALYSIS, ROUTINE W REFLEX MICROSCOPIC - Abnormal; Notable for the following components:   APPearance HAZY (*)    All other components within normal limits  POC URINE PREG, ED  TROPONIN I (HIGH SENSITIVITY)  TROPONIN I (HIGH SENSITIVITY)   EKG EKG Interpretation  Date/Time:  Friday  October 26 2021 08:51:22 EDT Ventricular Rate:  57 PR Interval:  145 QRS Duration: 108 QT Interval:  435 QTC Calculation: 424 R Axis:   55 Text Interpretation: Sinus rhythm no acute ST,Ts No significant change since prior 8/23 Confirmed by Meridee Score [phone]) on 10/26/2021 9:53:10 AM  Radiology US Abdomen Limited RUQ (LIVER/GB)  Result Date: 10/26/2021 CLINICAL DATA:  Epigastric pain EXAM: ULTRASOUND ABDOMEN LIMITED RIGHT UPPER QUADRANT COMPARISON:  None Available. FINDINGS: Gallbladder: Layering echogenic debris with a more focal rounded shadowing stone is visualized. Gallstone measures up to 1.6 cm. Gallbladder wall measures up to 0.3 cm. No sonographic Murphy sign noted by sonographer. Common bile duct: Diameter: 0.7 cm Liver: No focal lesion identified. Within normal limits in parenchymal echogenicity. Portal vein is patent on color Doppler imaging with normal direction of blood flow towards the liver. Other: None. IMPRESSION: Cholelithiasis without evidence of gallbladder wall thickening or a positive sonographic murphy sign to definitively suggest acute cholecystitis. Electronically Signed   By: Lorenza Cambridge M.D.   On: 10/26/2021 11:34    Procedures Procedures    Medications Ordered in ED Medications  alum & mag hydroxide-simeth (MAALOX/MYLANTA) 200-200-20 MG/5ML suspension 30 mL (has no administration in time range)  ondansetron (ZOFRAN) injection 4  mg (4 mg Intravenous Given 10/26/21 1032)  pantoprazole (PROTONIX) injection 40 mg (40 mg Intravenous Given 10/26/21 1032)    ED Course/ Medical Decision Making/ A&P                           Medical Decision Making Amount and/or Complexity of Data Reviewed Labs: ordered. Radiology: ordered.  Risk OTC drugs. Prescription drug management.  This patient presents to the ED with chief complaint(s) of abdominal pain with pertinent past medical history of diabetes, GERD, IBS which further complicates the presenting complaint. The complaint involves an extensive differential diagnosis and also carries with it a high risk of complications and morbidity.    The differential diagnosis includes Acute hepatobiliary disease, pancreatitis, appendicitis, PUD, gastritis, SBO, diverticulitis, colitis, viral gastroenteritis, Crohn's, UC, vascular catastrophe, UTI, pyelonephritis, renal stone, obstructed stone, infected stone, ovarian torsion, ectopic pregnancy, TOA, PID, STD, etc.    Additional history obtained: Additional history obtained from  none available Records reviewed Care Everywhere/External Records and Primary Care Documents most recent ED visit physician note  ED Course and Reassessment: 41 year old female who presents to the emergency department with epigastric abdominal pain.  Pain to palpation of the epigastrium.  Labs with negative UA so doubt UTI, pyelonephritis. No urinary symptoms or flank pain concerning for stone, obstructed stone, infected stone.  Not pregnant. Troponin 2, EKG without ischemia, doubt ACS  CBC nl  Lipase 66, only mildly elevated, doubt pancreatitis.  CMP without transaminitis so doubt acute hepatobiliary disease.  Symptoms inconsistent with abdominal etiology such as appendicitis, SBO, diverticulitis, vascular catastrophe.  Given zofran, protonix. Feeling somewhat improved afterward.  Likely PUD, given symptoms and that she is taking Goody powders daily.  Discussed stopping goody powder, NSAID use. She verbalized understanding. Will start her on protonix, carafate. She has nausea medication at home already. Will have her f/u with PCP in 1 week for re-evaluation of symptoms. Given return precautions and she verbalizes understanding.  Safe for discharge.    Independent labs interpretation:  The following labs were independently interpreted: CMP nl, lipase 66, ua negative, trop negative  Independent visualization of imaging: - I independently visualized the following imaging with scope of interpretation limited to  determining acute life threatening conditions related to emergency care: [place] RUQ, which revealed cholelithiasis  Consultation: - Consulted or discussed management/test interpretation w/ external professional: not indicated   Consideration for admission or further workup: could consider CT A/P, however, symptoms improving, likely PUD Social Determinants of health: low health literacy  Final Clinical Impression(s) / ED Diagnoses Final diagnoses:  Epigastric abdominal pain    Rx / DC Orders ED Discharge Orders          Ordered    pantoprazole (PROTONIX) 20 MG tablet  Daily        10/26/21 1209    sucralfate (CARAFATE) 1 g tablet  3 times daily with meals & bedtime        10/26/21 1209              Cristopher Peru, PA-C 10/26/21 1215    Terrilee Files, MD 10/26/21 1735

## 2021-10-26 NOTE — ED Notes (Addendum)
Pt reports significant improvement in nausea and abdominal pain. Nausea has nearly subsided and abd pain is now 5-10 \

## 2021-10-26 NOTE — Discharge Instructions (Addendum)
You were seen in the emergency department for abdominal pain.  You likely have peptic ulcer disease.  Please stop taking Goody powders as this is likely causing your symptoms.  I am prescribing you Carafate and Protonix which are medications to help decrease acid production and protect the stomach lining.  Please return to the emergency department for significantly worsening symptoms.  Please follow-up with your primary care provider in 1 week to reevaluate your symptoms.

## 2021-11-08 ENCOUNTER — Telehealth: Payer: Self-pay | Admitting: *Deleted

## 2021-11-08 NOTE — Telephone Encounter (Signed)
Pt wanted to let you know that she went to the ER on 10/26/21 for really bad abdominal pain. She said an ultrasound was done and was told that it showed gallstones. She wanted to make sure it was ok to schedule TCS/EGD. Please advise. Thank you

## 2021-11-09 NOTE — Telephone Encounter (Signed)
Reading the notes in the ED, sounds like her pain was in epigastric region. She has gallstones but no acute gb infection or blockage by stones. History of ASA powder use and ER doc suspicious of gastritis or PUD>   If her previous lower abdominal pain (diverticulitis symptoms) are improved, we can move towards colonoscopy and upper endoscopy as previously advised.

## 2021-11-09 NOTE — Telephone Encounter (Signed)
Attempted to call pt, voicemail not set up. [phone]

## 2021-11-28 NOTE — Telephone Encounter (Signed)
Pt informed of providers message. She states that she is still having some burning in her stomach and issues with her reflux even though being on reflux medication. She wants to make an appt with the provider to discuss this before scheduling her procedure. Pt transferred to front to make an appt.

## 2022-01-11 ENCOUNTER — Encounter: Payer: Self-pay | Admitting: Gastroenterology

## 2022-01-11 ENCOUNTER — Ambulatory Visit: Payer: 59 | Admitting: Gastroenterology
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
## 2022-01-23 ENCOUNTER — Encounter: Payer: Self-pay | Admitting: Neurology

## 2022-01-23 ENCOUNTER — Ambulatory Visit: Payer: Medicaid Other | Admitting: Neurology

## 2022-02-10 NOTE — Progress Notes (Deleted)
GI Office Note    Referring Provider: Alanson Puls, [facility] Primary Care Physician:  [facility]  Primary Gastroenterologist: Elon Alas. Abbey Chatters, DO   Chief Complaint   No chief complaint on file.   History of Present Illness   Isabella Walker is a 41 y.o. female presenting today for follow up. Last seen 10/2021. She has history of diverticulitis, IBS-C, GERD, weight loss.   Plans for EGD/colonoscopy after last ov.  Unable to reach patient to scheduled. She called in a month later after going to the ED for epigastric pain. She elected to schedule OV instead of proceeding with procedures. She had u/s while in ED that showed gallstones.         Medications   Current Outpatient Medications  Medication Sig Dispense Refill   ACCU-CHEK GUIDE test strip  (Patient not taking: Reported on 10/10/2021)     albuterol (VENTOLIN HFA) 108 (90 Base) MCG/ACT inhaler albuterol sulfate HFA 90 mcg/actuation aerosol inhaler  INHALE 2 INHALATIONS INTO THE LUNGS EVERY 4 TO 6 HOURS AS NEEDED FOR SHORTNESS OF BREATH OR WHEEZING OR CHEST TIGHTNESS     busPIRone (BUSPAR) 10 MG tablet Take 10 mg by mouth 2 (two) times daily.     escitalopram (LEXAPRO) 20 MG tablet Take 20 mg by mouth daily.     gabapentin (NEURONTIN) 100 MG capsule Take 100 mg by mouth 3 (three) times daily as needed (as needed for anxiety).     Galcanezumab-gnlm (EMGALITY) 120 MG/ML SOAJ Inject 120 mg into the skin every 28 (twenty-eight) days. 1.12 mL 5   levonorgestrel (MIRENA) 20 MCG/24HR IUD 1 each by Intrauterine route once.      metFORMIN (GLUCOPHAGE) 1000 MG tablet Take 1,000 mg by mouth 2 (two) times daily.     pantoprazole (PROTONIX) 20 MG tablet Take 1 tablet (20 mg total) by mouth daily. 30 tablet 0   simvastatin (ZOCOR) 40 MG tablet SMARTSIG:1 Tablet(s) By Mouth Every Evening     SPIRIVA RESPIMAT 2.5 MCG/ACT AERS SMARTSIG:2 Puff(s) Via Inhaler Daily     sucralfate (CARAFATE) 1 g tablet Take 1 tablet (1  g total) by mouth 4 (four) times daily -  with meals and at bedtime. 120 tablet 0   SUMAtriptan (IMITREX) 100 MG tablet Take 1 tablet earliest onset of migraine.  May repeat in 2 hours if headache persists or recurs.  Maximum 2 tablets in 24 hours 10 tablet 5   topiramate (TOPAMAX) 100 MG tablet Take 1.5 tablets (150 mg total) by mouth 2 (two) times daily. 45 tablet 3   triamterene-hydrochlorothiazide (DYAZIDE) 37.5-25 MG capsule Take 1 capsule by mouth daily.     No current facility-administered medications for this visit.    Allergies   Allergies as of 02/11/2022 - Review Complete 10/26/2021  Allergen Reaction Noted   Bextra [valdecoxib] Other (See Comments) 05/01/2012     Past Medical History   Past Medical History:  Diagnosis Date   Anxiety    Bipolar 1 disorder ([place])    Complication of anesthesia    Depression complicating pregnancy, postpartum 2012   Diabetes mellitus without complication (HCC)    GERD (gastroesophageal reflux disease)    Headache(784.0)    hx of migraines-last one few months ago.   High cholesterol    Hypertension    IBS (irritable bowel syndrome)    Migraines    PTSD (post-traumatic stress disorder)    Thyroid disease     Past Surgical History  Past Surgical History:  Procedure Laterality Date   CARPAL TUNNEL RELEASE Left 08/20/2018   Procedure: CARPAL TUNNEL RELEASE;  Surgeon: Carole Civil, MD;  Location: [facility];  Service: Orthopedics;  Laterality: Left;   INSERTION OF MESH N/A 05/25/2012   Procedure: INSERTION OF MESH;  Surgeon: Jamesetta So, MD;  Location: [facility];  Service: General;  Laterality: N/A;   ORIF KNEE DISLOCATION Right    VENTRAL HERNIA REPAIR N/A 05/25/2012   Procedure: LAPAROSCOPIC VENTRAL HERNIA;  Surgeon: Jamesetta So, MD;  Location: [facility];  Service: General;  Laterality: N/A;    Past Family History   Family History  Problem Relation Age of Onset   High blood pressure Mother    Diverticulitis Mother        had  colostomy   Diabetes Father    High blood pressure Father    Diverticulitis Maternal Grandmother        had to have colon surgery   Cancer Paternal Aunt        ovarian   Colon cancer Neg Hx     Past Social History   Social History   Socioeconomic History   Marital status: Married    Spouse name: Not on file   Number of children: Not on file   Years of education: Not on file   Highest education level: Not on file  Occupational History   Not on file  Tobacco Use   Smoking status: Every Day    Packs/day: 1.00    Years: 15.00    Total pack years: 15.00    Types: Cigarettes    Last attempt to quit: 01/31/2017    Years since quitting: 5.0   Smokeless tobacco: Never  Vaping Use   Vaping Use: Former  Substance and Sexual Activity   Alcohol use: Never   Drug use: Yes    Types: Marijuana   Sexual activity: Yes    Birth control/protection: I.U.D.  Other Topics Concern   Not on file  Social History Narrative   ** Merged History Encounter **       Social Determinants of Health   Financial Resource Strain: Not on file  Food Insecurity: Not on file  Transportation Needs: Not on file  Physical Activity: Not on file  Stress: Not on file  Social Connections: Not on file  Intimate Partner Violence: Not on file    Review of Systems   General: Negative for anorexia, weight loss, fever, chills, fatigue, weakness. ENT: Negative for hoarseness, difficulty swallowing , nasal congestion. CV: Negative for chest pain, angina, palpitations, dyspnea on exertion, peripheral edema.  Respiratory: Negative for dyspnea at rest, dyspnea on exertion, cough, sputum, wheezing.  GI: See history of present illness. GU:  Negative for dysuria, hematuria, urinary incontinence, urinary frequency, nocturnal urination.  Endo: Negative for unusual weight change.     Physical Exam   There were no vitals taken for this visit.   General: Well-nourished, well-developed in no acute distress.  Eyes:  No icterus. Mouth: Oropharyngeal mucosa moist and pink , no lesions erythema or exudate. Lungs: Clear to auscultation bilaterally.  Heart: Regular rate and rhythm, no murmurs rubs or gallops.  Abdomen: Bowel sounds are normal, nontender, nondistended, no hepatosplenomegaly or masses,  no abdominal bruits or hernia , no rebound or guarding.  Rectal: ***  Extremities: No lower extremity edema. No clubbing or deformities. Neuro: Alert and oriented x 4   Skin: Warm and dry, no jaundice.   Psych: Alert  and cooperative, normal mood and affect.  Labs   Lab Results  Component Value Date   LIPASE 66 (H) 10/26/2021   Lab Results  Component Value Date   CREATININE 0.58 10/26/2021   BUN 23 (H) 10/26/2021   NA 137 10/26/2021   K 3.8 10/26/2021   CL 107 10/26/2021   CO2 22 10/26/2021   Lab Results  Component Value Date   WBC 7.1 10/26/2021   HGB 13.8 10/26/2021   HCT 41.1 10/26/2021   MCV 103.0 (H) 10/26/2021   PLT 209 10/26/2021   Lab Results  Component Value Date   ALT 13 10/26/2021   AST 13 (L) 10/26/2021   ALKPHOS 36 (L) 10/26/2021   BILITOT 0.6 10/26/2021    Imaging Studies   No results found.  Assessment       PLAN   ***   Laureen Ochs. Bobby Rumpf, [place], [facility]

## 2022-02-11 ENCOUNTER — Ambulatory Visit: Payer: 59 | Admitting: Gastroenterology
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
## 2022-08-30 ENCOUNTER — Encounter: Payer: Self-pay | Admitting: Neurology

## 2022-08-30 ENCOUNTER — Ambulatory Visit: Payer: 59 | Admitting: Neurology

## 2022-10-05 ENCOUNTER — Emergency Department (HOSPITAL_COMMUNITY): Payer: Medicaid Other

## 2022-10-05 ENCOUNTER — Encounter (HOSPITAL_COMMUNITY): Payer: Self-pay

## 2022-10-05 ENCOUNTER — Emergency Department (HOSPITAL_COMMUNITY)
Admission: EM | Admit: 2022-10-05 | Discharge: 2022-10-06 | Disposition: A | Discharge status: Home / Self Care | Payer: Medicaid Other | Attending: Emergency Medicine | Admitting: Emergency Medicine

## 2022-10-05 DIAGNOSIS — M25461 Effusion, right knee: Secondary | ICD-10-CM | POA: Insufficient documentation

## 2022-10-05 DIAGNOSIS — M25561 Pain in right knee: Secondary | ICD-10-CM | POA: Diagnosis present

## 2022-10-05 NOTE — ED Triage Notes (Signed)
Pt from home, ambulatory to triage with c/o right knee pain that started about 3-4 weeks ago,pt says knee/leg is swollen, does not appear swollen when compared to left leg upon assessment. Tender to touch. Pt denies injury.

## 2022-10-06 NOTE — ED Provider Notes (Signed)
[place] EMERGENCY DEPARTMENT AT [facility] Provider Note   CSN: [id number] Arrival date & time: 10/05/22  2006     History  Chief Complaint  Patient presents with   Knee Pain    X 3-4 weeks    Isabella Walker is a 42 y.o. female.  The history is provided by the patient.  Patient with history of obesity presents with right knee pain and swelling for over 3 weeks.  Denies any trauma, but she is on her feet daily.  She reports the swelling is worsening around the knee and is now extending into her lower leg.  No previous history of VTE.  She reports it hurts to walk Reports distant history of surgery to her knee as a young child     Home Medications Prior to Admission medications   Medication Sig Start Date End Date Taking? Authorizing Provider  ACCU-CHEK GUIDE test strip  08/15/21   [provider]  albuterol (VENTOLIN HFA) 108 (90 Base) MCG/ACT inhaler albuterol sulfate HFA 90 mcg/actuation aerosol inhaler  INHALE 2 INHALATIONS INTO THE LUNGS EVERY 4 TO 6 HOURS AS NEEDED FOR SHORTNESS OF BREATH OR WHEEZING OR CHEST TIGHTNESS    [provider]  busPIRone (BUSPAR) 10 MG tablet Take 10 mg by mouth 2 (two) times daily. 06/19/21   [provider]  escitalopram (LEXAPRO) 20 MG tablet Take 20 mg by mouth daily. 06/07/21   [provider]  gabapentin (NEURONTIN) 100 MG capsule Take 100 mg by mouth 3 (three) times daily as needed (as needed for anxiety). 06/26/21   [provider]  Galcanezumab-gnlm (EMGALITY) 120 MG/ML SOAJ Inject 120 mg into the skin every 28 (twenty-eight) days. 08/31/21   Drema Dallas, DO  levonorgestrel (MIRENA) 20 MCG/24HR IUD 1 each by Intrauterine route once.     [provider]  metFORMIN (GLUCOPHAGE) 1000 MG tablet Take 1,000 mg by mouth 2 (two) times daily. 05/24/20   [provider]  pantoprazole (PROTONIX) 20 MG tablet Take 1 tablet (20 mg total) by mouth daily. 10/26/21   Cristopher Peru,  PA-C  simvastatin (ZOCOR) 40 MG tablet SMARTSIG:1 Tablet(s) By Mouth Every Evening 06/07/21   [provider]  SPIRIVA RESPIMAT 2.5 MCG/ACT AERS SMARTSIG:2 Puff(s) Via Inhaler Daily 08/17/21   [provider]  sucralfate (CARAFATE) 1 g tablet Take 1 tablet (1 g total) by mouth 4 (four) times daily -  with meals and at bedtime. 10/26/21 11/25/21  Cristopher Peru, PA-C  SUMAtriptan (IMITREX) 100 MG tablet Take 1 tablet earliest onset of migraine.  May repeat in 2 hours if headache persists or recurs.  Maximum 2 tablets in 24 hours 02/15/21   Drema Dallas, DO  topiramate (TOPAMAX) 100 MG tablet Take 1.5 tablets (150 mg total) by mouth 2 (two) times daily. 10/15/21   Drema Dallas, DO  triamterene-hydrochlorothiazide (DYAZIDE) 37.5-25 MG capsule Take 1 capsule by mouth daily. 04/01/20   [provider]      Allergies    Bextra [valdecoxib]    Review of Systems   Review of Systems  Musculoskeletal:  Positive for arthralgias and joint swelling.    Physical Exam Updated Vital Signs BP (!) 155/82 (BP Location: Left Arm)   Pulse 98   Temp 98.8 F (37.1 C) (Oral)   Resp 20   Ht 1.6 m (5\' 3" )   Wt 93.9 kg   SpO2 100%   BMI 36.67 kg/m  Physical Exam CONSTITUTIONAL: Well developed/well  nourished HEAD: Normocephalic/atraumatic NEURO: Pt is awake/alert/appropriate, moves all extremitiesx4.  No facial droop.   EXTREMITIES: pulses normal/equal, full ROM Right knee has evidence of an effusion but there is no erythema or deformities.  She can flex and extend the knee but limited due to pain.  Distal pulses equal and intact.  Patient with pitting edema to bilateral lower extremities There is no erythema There is no calf tenderness No other tenderness is noted to the right knee SKIN: warm, color normal   ED Results / Procedures / Treatments   Labs (all labs ordered are listed, but only abnormal results are displayed) Labs Reviewed - No data to  display  EKG None  Radiology DG Knee Complete 4 Views Right  Result Date: 10/05/2022 CLINICAL DATA:  Right knee pain starting 4 weeks ago. Right leg and knee are swollen. EXAM: RIGHT KNEE - COMPLETE 4+ VIEW COMPARISON:  None Available. FINDINGS: Mild degenerative changes in the right knee with medial compartment narrowing and small osteophyte formation. No evidence of acute fracture or dislocation. No focal bone lesion or bone destruction. Bone cortex appears intact. No significant effusions. Soft tissues are unremarkable. IMPRESSION: Mild degenerative changes in the right knee involving the medial compartment. No acute bony abnormalities. Electronically Signed   By: Burman Nieves M.D.   On: 10/05/2022 21:18    Procedures Procedures    Medications Ordered in ED Medications - No data to display  ED Course/ Medical Decision Making/ A&P                                 Medical Decision Making Amount and/or Complexity of Data Reviewed Radiology: ordered.   Presents for right knee pain for several weeks. Denies any trauma, but she does have evidence of an effusion on exam Suspect internal derangement of the knee Advise crutches, ice and elevation and referral to orthopedics  Patient otherwise safe for discharge home.  I have low suspicion for DVT at this time       Final Clinical Impression(s) / ED Diagnoses Final diagnoses:  Effusion, right knee    Rx / DC Orders ED Discharge Orders     None         Zadie Rhine, MD 10/06/22 [phone]

## 2022-10-08 ENCOUNTER — Ambulatory Visit: Payer: Medicaid Other | Admitting: Orthopedic Surgery

## 2022-10-17 ENCOUNTER — Ambulatory Visit: Payer: Medicaid Other | Admitting: Orthopedic Surgery

## 2022-10-17 ENCOUNTER — Encounter: Payer: Self-pay | Admitting: Orthopedic Surgery

## 2022-10-17 VITALS — BP 141/89 | HR 66 | Ht 63.0 in | Wt 207.0 lb

## 2022-10-17 DIAGNOSIS — M79641 Pain in right hand: Secondary | ICD-10-CM | POA: Diagnosis not present

## 2022-10-17 DIAGNOSIS — M25461 Effusion, right knee: Secondary | ICD-10-CM

## 2022-10-17 DIAGNOSIS — M25561 Pain in right knee: Secondary | ICD-10-CM

## 2022-10-17 DIAGNOSIS — R202 Paresthesia of skin: Secondary | ICD-10-CM | POA: Diagnosis not present

## 2022-10-17 MED ORDER — INDOMETHACIN 25 MG PO CAPS: 25.0000 mg | ORAL_CAPSULE | Freq: Three times a day (TID) | ORAL | 0 refills | Status: AC

## 2022-10-17 NOTE — Progress Notes (Signed)
Office Visit Note   Patient: Isabella Walker           Date of Birth: 09-27-1980           MRN: [id number] Visit Date: 10/17/2022 Requested by: Kathalene Frames [facility] [address] [place],  [place] [address] PCP: Ponciano Ort, [facility]   Assessment & Plan:   Encounter Diagnoses  Name Primary?   Pain in right hand Yes   Paresthesias in right hand    Acute pain of right knee    Effusion, right knee     Meds ordered this encounter  Medications   indomethacin (INDOCIN) 25 MG capsule    Sig: Take 1 capsule (25 mg total) by mouth 3 (three) times daily with meals.    Dispense:  90 capsule    Refill:  0    Swelling of the knee. ? Cause   NSAID and PT  Crutches   Return in 4- 6 weeks    Subjective: Chief Complaint  Patient presents with   Knee Pain    Right since September no injury    HPI: 42 YO f SWELLING RIGHT KNEE NO TRAUMA,  ER xray neg. For ac. Process.  Presents c/o severe pain and soreness right knee with anterior and post kn pain with rad to lower leg               ROS: right hand numb pain weakness (refer to hand)    Images personally read and my interpretation :  grade 2 OA   Visit Diagnoses:  1. Pain in right hand   2. Paresthesias in right hand   3. Acute pain of right knee   4. Effusion, right knee      Follow-Up Instructions: Return in about 5 weeks (around 11/21/2022) for FOLLOW UP, RIGHT, KNEE.    Objective: Vital Signs: BP (!) 141/89   Pulse 66   Ht 5\' 3"  (1.6 m)   Wt 207 lb (93.9 kg)   BMI 36.67 kg/m   Physical Exam Vitals and nursing note reviewed.  Constitutional:      Appearance: Normal appearance.  HENT:     Head: Normocephalic and atraumatic.  Eyes:     General: No scleral icterus.       Right eye: No discharge.        Left eye: No discharge.     Extraocular Movements: Extraocular movements intact.     Conjunctiva/sclera: Conjunctivae normal.     Pupils: Pupils are equal, round, and reactive to light.   Cardiovascular:     Rate and Rhythm: Normal rate.     Pulses: Normal pulses.  Musculoskeletal:     Right knee: Effusion present.     Left knee:     Instability Tests: Medial McMurray test negative and lateral McMurray test negative.  Skin:    General: Skin is warm and dry.     Capillary Refill: Capillary refill takes less than 2 seconds.  Neurological:     General: No focal deficit present.     Mental Status: She is alert and oriented to person, place, and time.     Gait: Gait abnormal.  Psychiatric:        Mood and Affect: Mood normal.        Behavior: Behavior normal.        Thought Content: Thought content normal.        Judgment: Judgment normal.     Right Knee Exam  Muscle Strength  The patient has normal right knee strength.  Tenderness  The patient is experiencing tenderness in the medial joint line (popliteal space).  Range of Motion  Extension:  10 abnormal  Flexion:  80 abnormal   Tests  Drawer:  Anterior - negative    Posterior - negative  Other  Erythema: absent Scars: absent Sensation: normal Pulse: present Swelling: none Effusion: effusion present   Left Knee Exam   Muscle Strength  The patient has normal left knee strength.  Tenderness  The patient is experiencing no tenderness.   Range of Motion  Extension:  normal  Flexion:  normal   Tests  McMurray:  Medial - negative Lateral - negative Varus: negative Valgus: negative Drawer:  Anterior - negative     Posterior - negative  Other  Erythema: absent Scars: absent Sensation: normal Pulse: present Swelling: none      Specialty Comments:  No specialty comments available.  Imaging: No results found.   PMFS History: Patient Active Problem List   Diagnosis Date Noted   Sigmoid diverticulitis 10/10/2021   Abnormal weight loss 09/03/2021   Abdominal pain 09/03/2021   Constipation 09/03/2021   Nausea without vomiting 09/03/2021   GERD (gastroesophageal reflux disease)  09/03/2021   S/P carpal tunnel release 08/20/2018 09/10/2018   Carpal tunnel syndrome of left wrist    Amenorrhea 09/06/2016   Body mass index (BMI) of 45.0 to 49.9 in adult [facility]) 09/06/2016   Depression with anxiety 09/06/2016   Muscle weakness (generalized) 11/05/2011   SHOULDER PAIN 10/27/2007   CERVICAL SPASM 10/27/2007   Past Medical History:  Diagnosis Date   Anxiety    Bipolar 1 disorder (HCC)    Complication of anesthesia    Depression complicating pregnancy, postpartum 2012   Diabetes mellitus without complication (HCC)    GERD (gastroesophageal reflux disease)    Headache(784.0)    hx of migraines-last one few months ago.   High cholesterol    Hypertension    IBS (irritable bowel syndrome)    Migraines    PTSD (post-traumatic stress disorder)    Thyroid disease     Family History  Problem Relation Age of Onset   High blood pressure Mother    Diverticulitis Mother        had colostomy   Diabetes Father    High blood pressure Father    Diverticulitis Maternal Grandmother        had to have colon surgery   Cancer Paternal Aunt        ovarian   Colon cancer Neg Hx     Past Surgical History:  Procedure Laterality Date   CARPAL TUNNEL RELEASE Left 08/20/2018   Procedure: CARPAL TUNNEL RELEASE;  Surgeon: Vickki Hearing, MD;  Location: [facility];  Service: Orthopedics;  Laterality: Left;   INSERTION OF MESH N/A 05/25/2012   Procedure: INSERTION OF MESH;  Surgeon: Dalia Heading, MD;  Location: [facility];  Service: General;  Laterality: N/A;   ORIF KNEE DISLOCATION Right    VENTRAL HERNIA REPAIR N/A 05/25/2012   Procedure: LAPAROSCOPIC VENTRAL HERNIA;  Surgeon: Dalia Heading, MD;  Location: [facility];  Service: General;  Laterality: N/A;   Social History   Occupational History   Not on file  Tobacco Use   Smoking status: Every Day    Current packs/day: 0.00    Average packs/day: 1 pack/day for 15.0 years (15.0 ttl pk-yrs)    Types: Cigarettes    Start date:  01/31/2002  Last attempt to quit: 01/31/2017    Years since quitting: 5.7   Smokeless tobacco: Never  Vaping Use   Vaping status: Former  Substance and Sexual Activity   Alcohol use: Never   Drug use: Yes    Types: Marijuana   Sexual activity: Yes    Birth control/protection: I.U.D.

## 2022-10-17 NOTE — Patient Instructions (Signed)
Physical therapy has been ordered for you at [place]. They should call you to schedule, [phone] is the phone number to call, if you want to call to schedule.   

## 2022-10-21 ENCOUNTER — Ambulatory Visit (HOSPITAL_COMMUNITY): Payer: Medicaid Other

## 2022-11-11 ENCOUNTER — Ambulatory Visit (HOSPITAL_COMMUNITY): Payer: Medicaid Other | Attending: Orthopedic Surgery

## 2022-11-18 ENCOUNTER — Ambulatory Visit: Payer: Medicaid Other | Admitting: Orthopedic Surgery

## 2022-11-21 ENCOUNTER — Ambulatory Visit: Payer: Medicaid Other | Admitting: Orthopedic Surgery

## 2023-02-15 ENCOUNTER — Emergency Department (HOSPITAL_COMMUNITY): Payer: Medicaid Other

## 2023-02-15 ENCOUNTER — Inpatient Hospital Stay (HOSPITAL_COMMUNITY)
Admission: EM | Admit: 2023-02-15 | Discharge: 2023-02-18 | DRG: 392 | Disposition: A | Discharge status: Home / Self Care | Payer: Medicaid Other | Attending: Family Medicine | Admitting: Family Medicine

## 2023-02-15 DIAGNOSIS — E079 Disorder of thyroid, unspecified: Secondary | ICD-10-CM | POA: Diagnosis present

## 2023-02-15 DIAGNOSIS — F17210 Nicotine dependence, cigarettes, uncomplicated: Secondary | ICD-10-CM | POA: Diagnosis present

## 2023-02-15 DIAGNOSIS — Z3202 Encounter for pregnancy test, result negative: Secondary | ICD-10-CM | POA: Diagnosis present

## 2023-02-15 DIAGNOSIS — Z7982 Long term (current) use of aspirin: Secondary | ICD-10-CM

## 2023-02-15 DIAGNOSIS — Z23 Encounter for immunization: Secondary | ICD-10-CM | POA: Diagnosis not present

## 2023-02-15 DIAGNOSIS — K219 Gastro-esophageal reflux disease without esophagitis: Secondary | ICD-10-CM | POA: Diagnosis present

## 2023-02-15 DIAGNOSIS — F319 Bipolar disorder, unspecified: Secondary | ICD-10-CM | POA: Diagnosis present

## 2023-02-15 DIAGNOSIS — Z888 Allergy status to other drugs, medicaments and biological substances status: Secondary | ICD-10-CM

## 2023-02-15 DIAGNOSIS — K5792 Diverticulitis of intestine, part unspecified, without perforation or abscess without bleeding: Principal | ICD-10-CM

## 2023-02-15 DIAGNOSIS — I10 Essential (primary) hypertension: Secondary | ICD-10-CM | POA: Diagnosis present

## 2023-02-15 DIAGNOSIS — D539 Nutritional anemia, unspecified: Secondary | ICD-10-CM | POA: Diagnosis present

## 2023-02-15 DIAGNOSIS — F4310 Post-traumatic stress disorder, unspecified: Secondary | ICD-10-CM | POA: Diagnosis present

## 2023-02-15 DIAGNOSIS — K5720 Diverticulitis of large intestine with perforation and abscess without bleeding: Principal | ICD-10-CM | POA: Diagnosis present

## 2023-02-15 DIAGNOSIS — R748 Abnormal levels of other serum enzymes: Secondary | ICD-10-CM | POA: Diagnosis present

## 2023-02-15 DIAGNOSIS — K5900 Constipation, unspecified: Secondary | ICD-10-CM | POA: Diagnosis present

## 2023-02-15 DIAGNOSIS — E119 Type 2 diabetes mellitus without complications: Secondary | ICD-10-CM | POA: Diagnosis present

## 2023-02-15 DIAGNOSIS — Z833 Family history of diabetes mellitus: Secondary | ICD-10-CM

## 2023-02-15 DIAGNOSIS — E7800 Pure hypercholesterolemia, unspecified: Secondary | ICD-10-CM | POA: Diagnosis present

## 2023-02-15 DIAGNOSIS — R718 Other abnormality of red blood cells: Secondary | ICD-10-CM | POA: Diagnosis present

## 2023-02-15 DIAGNOSIS — Z79899 Other long term (current) drug therapy: Secondary | ICD-10-CM | POA: Diagnosis not present

## 2023-02-15 DIAGNOSIS — K5732 Diverticulitis of large intestine without perforation or abscess without bleeding: Secondary | ICD-10-CM | POA: Diagnosis not present

## 2023-02-15 DIAGNOSIS — Z6841 Body Mass Index (BMI) 40.0 and over, adult: Secondary | ICD-10-CM | POA: Diagnosis not present

## 2023-02-15 DIAGNOSIS — E66812 Obesity, class 2: Secondary | ICD-10-CM | POA: Diagnosis present

## 2023-02-15 DIAGNOSIS — R1032 Left lower quadrant pain: Secondary | ICD-10-CM | POA: Diagnosis present

## 2023-02-15 DIAGNOSIS — Z7984 Long term (current) use of oral hypoglycemic drugs: Secondary | ICD-10-CM | POA: Diagnosis not present

## 2023-02-15 DIAGNOSIS — Z720 Tobacco use: Secondary | ICD-10-CM | POA: Diagnosis present

## 2023-02-15 LAB — CBC
HCT: 46.5 % — ABNORMAL HIGH (ref 36.0–46.0)
Hemoglobin: 15.3 g/dL — ABNORMAL HIGH (ref 12.0–15.0)
MCH: 34.1 pg — ABNORMAL HIGH (ref 26.0–34.0)
MCHC: 32.9 g/dL (ref 30.0–36.0)
MCV: 103.6 fL — ABNORMAL HIGH (ref 80.0–100.0)
Platelets: 272 10*3/uL (ref 150–400)
RBC: 4.49 MIL/uL (ref 3.87–5.11)
RDW: 13.3 % (ref 11.5–15.5)
WBC: 11.6 10*3/uL — ABNORMAL HIGH (ref 4.0–10.5)
nRBC: 0 % (ref 0.0–0.2)

## 2023-02-15 LAB — LIPASE, BLOOD: Lipase: 78 U/L — ABNORMAL HIGH (ref 11–51)

## 2023-02-15 LAB — URINALYSIS, ROUTINE W REFLEX MICROSCOPIC
Bacteria, UA: NONE SEEN
Bilirubin Urine: NEGATIVE
Glucose, UA: NEGATIVE mg/dL
Ketones, ur: NEGATIVE mg/dL
Leukocytes,Ua: NEGATIVE
Nitrite: NEGATIVE
Protein, ur: NEGATIVE mg/dL
Specific Gravity, Urine: 1.02 (ref 1.005–1.030)
pH: 5 (ref 5.0–8.0)

## 2023-02-15 LAB — COMPREHENSIVE METABOLIC PANEL
ALT: 14 U/L (ref 0–44)
AST: 12 U/L — ABNORMAL LOW (ref 15–41)
Albumin: 4 g/dL (ref 3.5–5.0)
Alkaline Phosphatase: 40 U/L (ref 38–126)
Anion gap: 8 (ref 5–15)
BUN: 20 mg/dL (ref 6–20)
CO2: 21 mmol/L — ABNORMAL LOW (ref 22–32)
Calcium: 9.1 mg/dL (ref 8.9–10.3)
Chloride: 106 mmol/L (ref 98–111)
Creatinine, Ser: 0.73 mg/dL (ref 0.44–1.00)
GFR, Estimated: >60 mL/min (ref >60)
Glucose, Bld: 125 mg/dL — ABNORMAL HIGH (ref 70–99)
Potassium: 4.3 mmol/L (ref 3.5–5.1)
Sodium: 135 mmol/L (ref 135–145)
Total Bilirubin: 1.1 mg/dL (ref 0.0–1.2)
Total Protein: 7.5 g/dL (ref 6.5–8.1)

## 2023-02-15 LAB — POC URINE PREG, ED: Preg Test, Ur: NEGATIVE

## 2023-02-15 MED ORDER — IOHEXOL 300 MG/ML  SOLN
100.0000 mL | Freq: Once | INTRAMUSCULAR | Status: AC | PRN
  Administered 2023-02-15: 100 mL via INTRAVENOUS

## 2023-02-15 MED ORDER — ACETAMINOPHEN 325 MG PO TABS
650.0000 mg | ORAL_TABLET | Freq: Four times a day (QID) | ORAL | Status: DC | PRN
  Administered 2023-02-15 – 2023-02-18 (×6): 650 mg via ORAL
  Filled 2023-02-15 (×6): qty 2

## 2023-02-15 MED ORDER — HEPARIN SODIUM (PORCINE) 5000 UNIT/ML IJ SOLN
5000.0000 [IU] | Freq: Three times a day (TID) | INTRAMUSCULAR | Status: DC
  Administered 2023-02-15 – 2023-02-18 (×9): 5000 [IU] via SUBCUTANEOUS
  Filled 2023-02-15 (×9): qty 1

## 2023-02-15 MED ORDER — ONDANSETRON HCL 4 MG PO TABS: 4.0000 mg | ORAL_TABLET | Freq: Four times a day (QID) | ORAL | Status: DC | PRN

## 2023-02-15 MED ORDER — FENTANYL CITRATE PF 50 MCG/ML IJ SOSY
50.0000 ug | PREFILLED_SYRINGE | Freq: Once | INTRAMUSCULAR | Status: AC
  Administered 2023-02-15: 50 ug via INTRAVENOUS
  Filled 2023-02-15: qty 1

## 2023-02-15 MED ORDER — PIPERACILLIN-TAZOBACTAM 3.375 G IVPB 30 MIN
3.3750 g | Freq: Once | INTRAVENOUS | Status: AC
  Administered 2023-02-15: 3.375 g via INTRAVENOUS
  Filled 2023-02-15: qty 50

## 2023-02-15 MED ORDER — MORPHINE SULFATE (PF) 2 MG/ML IV SOLN
2.0000 mg | INTRAVENOUS | Status: DC | PRN
  Administered 2023-02-16 (×2): 2 mg via INTRAVENOUS
  Filled 2023-02-15 (×2): qty 1

## 2023-02-15 MED ORDER — ACETAMINOPHEN 650 MG RE SUPP: 650.0000 mg | Freq: Four times a day (QID) | RECTAL | Status: DC | PRN

## 2023-02-15 MED ORDER — MORPHINE SULFATE (PF) 4 MG/ML IV SOLN
4.0000 mg | Freq: Once | INTRAVENOUS | Status: AC
  Administered 2023-02-15: 4 mg via INTRAVENOUS
  Filled 2023-02-15: qty 1

## 2023-02-15 MED ORDER — INFLUENZA VIRUS VACC SPLIT PF (FLUZONE) 0.5 ML IM SUSY
0.5000 mL | PREFILLED_SYRINGE | INTRAMUSCULAR | Status: AC
  Administered 2023-02-16: 0.5 mL via INTRAMUSCULAR
  Filled 2023-02-15: qty 0.5

## 2023-02-15 MED ORDER — ONDANSETRON HCL 4 MG/2ML IJ SOLN
4.0000 mg | Freq: Once | INTRAMUSCULAR | Status: AC
  Administered 2023-02-15: 4 mg via INTRAVENOUS
  Filled 2023-02-15: qty 2

## 2023-02-15 MED ORDER — ONDANSETRON HCL 4 MG/2ML IJ SOLN: 4.0000 mg | Freq: Four times a day (QID) | INTRAMUSCULAR | Status: DC | PRN

## 2023-02-15 MED ORDER — PIPERACILLIN-TAZOBACTAM 3.375 G IVPB
3.3750 g | Freq: Three times a day (TID) | INTRAVENOUS | Status: DC
  Administered 2023-02-16 – 2023-02-18 (×7): 3.375 g via INTRAVENOUS
  Filled 2023-02-15 (×7): qty 50

## 2023-02-15 NOTE — ED Provider Notes (Signed)
  Physical Exam  BP (!) 117/36 (BP Location: Left Arm)   Pulse 87   Temp 98.3 F (36.8 C) (Oral)   Resp 18   SpO2 98%   Physical Exam Vitals and nursing note reviewed.  Constitutional:      General: She is not in acute distress.    Appearance: She is not toxic-appearing.  HENT:     Head: Normocephalic and atraumatic.  Eyes:     General: No scleral icterus.    Conjunctiva/sclera: Conjunctivae normal.  Cardiovascular:     Rate and Rhythm: Normal rate and regular rhythm.     Pulses: Normal pulses.     Heart sounds: Normal heart sounds.  Pulmonary:     Effort: Pulmonary effort is normal. No respiratory distress.     Breath sounds: Normal breath sounds.  Abdominal:     General: Abdomen is flat. Bowel sounds are normal.     Palpations: Abdomen is soft.     Tenderness: There is abdominal tenderness.  Musculoskeletal:     Right lower leg: No edema.     Left lower leg: No edema.  Skin:    General: Skin is warm and dry.     Findings: No lesion.  Neurological:     General: No focal deficit present.     Mental Status: She is alert and oriented to person, place, and time. Mental status is at baseline.     Procedures  Procedures  ED Course / MDM   Clinical Course as of 02/15/23 1934  Sat Feb 15, 2023  1908 CT w/ diverticulitis with associated small perforation. She is not peritoneal, she is HDS, start zosyn , consult get surg, admit  [SG]  1913 Dr Kallie General Surgery: Clear liquid, gen surgery to see tomorrow, admit to medicine.  [JB]    Clinical Course User Index [JB] Hiroyuki Ozanich, [place] SAILOR, PA-C [SG] Elnor Jayson LABOR, DO   Medical Decision Making Amount and/or Complexity of Data Reviewed Labs: ordered. Radiology: ordered.  Risk Prescription drug management. Decision regarding hospitalization.   Patient received at shift handoff.  In short patient has history of diverticulitis.  Presentation feels similar to prior diverticulitis flareups.  Patient has had 1 day of  abdominal pain. Feeling better after Zofran  and fentanyl , but still expressing pain.   Spoke with general surgery in regards to CT findings.  General surgery recommends medicine to admit, IV antibiotics.  They will see her tomorrow.  If patient continues to not have nausea vomiting expected trying clear liquid diet soon.   Spoke with hospital team who agree to see the patient and admit.  Patient stable for admission. Family and patient agree and understand plan.        Shermon Warren SAILOR, PA-C 02/15/23 1938    Elnor Jayson LABOR, DO 02/18/23 [phone]

## 2023-02-15 NOTE — Progress Notes (Signed)
 Pharmacy Antibiotic Note  Isabella Walker is a 43 y.o. female admitted on 02/15/2023 with  Intra-abdominal infection.  Pharmacy has been consulted for Zosyn  dosing.  Plan: Zosyn  3.375g IV q8h (4 hour infusion).     Temp (24hrs), Avg:98.2 F (36.8 C), Min:98 F (36.7 C), Max:98.3 F (36.8 C)  Recent Labs  Lab 02/15/23 1355  WBC 11.6*  CREATININE 0.73    CrCl cannot be calculated (Unknown ideal weight.).    Allergies  Allergen Reactions   Bextra [Valdecoxib] Other (See Comments)    SEVERE NOSE BLEEDS    Antimicrobials this admission: Zosyn  2/8 >>    Dose adjustments this admission:  Microbiology results:  Thank you for allowing pharmacy to be a part of this patient's care.  Olam Fritter, PharmD, BCPS 02/15/2023 8:12 PM

## 2023-02-15 NOTE — ED Notes (Signed)
 ED TO INPATIENT HANDOFF REPORT  ED Nurse Name and Phone #: [facility] [phone]  S Name/Age/Gender Isabella Walker 43 y.o. female Room/Bed: APA09/APA09  Code Status   Code Status: Full Code  Home/SNF/Other Home Patient oriented to: self, place, time, and situation Is this baseline? Yes   Triage Complete: Triage complete  Chief Complaint Sigmoid diverticulitis [K57.32]  Triage Note Pt states that yesterday evening she began having LLQ abd pain similar to previous diverticulitis pain. Pt states that overnight her pain became more generalized. Denies N/V/D. No bloody stools.    Allergies Allergies  Allergen Reactions   Bextra [Valdecoxib] Other (See Comments)    SEVERE NOSE BLEEDS    Level of Care/Admitting Diagnosis ED Disposition     ED Disposition  Admit   Condition  --   Comment  Hospital Area: [facility] [[id number]]  Level of Care: Med-Surg [16]  Covid Evaluation: Asymptomatic - no recent exposure (last 10 days) testing not required  Diagnosis: Sigmoid diverticulitis [659382]  Admitting Physician: ADEFESO, OLADAPO [[id number]]  Attending Physician: ADEFESO, OLADAPO [[id number]]  Certification:: I certify this patient will need inpatient services for at least 2 midnights  Expected Medical Readiness: 02/18/2023          B Medical/Surgery History Past Medical History:  Diagnosis Date   Anxiety    Bipolar 1 disorder (HCC)    Complication of anesthesia    Depression complicating pregnancy, postpartum 2012   Diabetes mellitus without complication (HCC)    GERD (gastroesophageal reflux disease)    Headache(784.0)    hx of migraines-last one few months ago.   High cholesterol    Hypertension    IBS (irritable bowel syndrome)    Migraines    PTSD (post-traumatic stress disorder)    Thyroid disease    Past Surgical History:  Procedure Laterality Date   CARPAL TUNNEL RELEASE Left 08/20/2018   Procedure: CARPAL TUNNEL RELEASE;  Surgeon: Margrette Taft BRAVO,  MD;  Location: [facility];  Service: Orthopedics;  Laterality: Left;   INSERTION OF MESH N/A 05/25/2012   Procedure: INSERTION OF MESH;  Surgeon: Oneil DELENA Budge, MD;  Location: [facility];  Service: General;  Laterality: N/A;   ORIF KNEE DISLOCATION Right    VENTRAL HERNIA REPAIR N/A 05/25/2012   Procedure: LAPAROSCOPIC VENTRAL HERNIA;  Surgeon: Oneil DELENA Budge, MD;  Location: [facility];  Service: General;  Laterality: N/A;     A IV Location/Drains/Wounds Patient Lines/Drains/Airways Status     Active Line/Drains/Airways     Name Placement date Placement time Site Days   Peripheral IV 02/15/23 20 G Anterior;Left;Proximal Forearm 02/15/23  1611  Forearm  less than 1            Intake/Output Last 24 hours No intake or output data in the 24 hours ending 02/15/23 2014  Labs/Imaging Results for orders placed or performed during the hospital encounter of 02/15/23 (from the past 48 hours)  Urinalysis, Routine w reflex microscopic -Urine, Clean Catch     Status: Abnormal   Collection Time: 02/15/23  1:18 PM  Result Value Ref Range   Color, Urine YELLOW YELLOW   APPearance CLEAR CLEAR   Specific Gravity, Urine 1.020 1.005 - 1.030   pH 5.0 5.0 - 8.0   Glucose, UA NEGATIVE NEGATIVE mg/dL   Hgb urine dipstick SMALL (A) NEGATIVE   Bilirubin Urine NEGATIVE NEGATIVE   Ketones, ur NEGATIVE NEGATIVE mg/dL   Protein, ur NEGATIVE NEGATIVE mg/dL   Nitrite NEGATIVE NEGATIVE   Leukocytes,Ua NEGATIVE  NEGATIVE   RBC / HPF 0-5 0 - 5 RBC/hpf   WBC, UA 0-5 0 - 5 WBC/hpf   Bacteria, UA NONE SEEN NONE SEEN   Squamous Epithelial / HPF 0-5 0 - 5 /HPF    Comment: Performed at [facility], [address], [place], [place] [address]  Lipase, blood     Status: Abnormal   Collection Time: 02/15/23  1:55 PM  Result Value Ref Range   Lipase 78 (H) 11 - 51 U/L    Comment: Performed at [facility], [address], [place], [place] [address]  Comprehensive metabolic panel     Status: Abnormal   Collection Time:  02/15/23  1:55 PM  Result Value Ref Range   Sodium 135 135 - 145 mmol/L   Potassium 4.3 3.5 - 5.1 mmol/L   Chloride 106 98 - 111 mmol/L   CO2 21 (L) 22 - 32 mmol/L   Glucose, Bld 125 (H) 70 - 99 mg/dL    Comment: Glucose reference range applies only to samples taken after fasting for at least 8 hours.   BUN 20 6 - 20 mg/dL   Creatinine, Ser 9.26 0.44 - 1.00 mg/dL   Calcium 9.1 8.9 - 89.6 mg/dL   Total Protein 7.5 6.5 - 8.1 g/dL   Albumin 4.0 3.5 - 5.0 g/dL   AST 12 (L) 15 - 41 U/L   ALT 14 0 - 44 U/L   Alkaline Phosphatase 40 38 - 126 U/L   Total Bilirubin 1.1 0.0 - 1.2 mg/dL   GFR, Estimated >39 >39 mL/min    Comment: (NOTE) Calculated using the CKD-EPI Creatinine Equation (2021)    Anion gap 8 5 - 15    Comment: Performed at [facility], [address], [place], [place] [address]  CBC     Status: Abnormal   Collection Time: 02/15/23  1:55 PM  Result Value Ref Range   WBC 11.6 (H) 4.0 - 10.5 K/uL   RBC 4.49 3.87 - 5.11 MIL/uL   Hemoglobin 15.3 (H) 12.0 - 15.0 g/dL   HCT 53.4 (H) 63.9 - 53.9 %   MCV 103.6 (H) 80.0 - 100.0 fL   MCH 34.1 (H) 26.0 - 34.0 pg   MCHC 32.9 30.0 - 36.0 g/dL   RDW 86.6 88.4 - 84.4 %   Platelets 272 150 - 400 K/uL   nRBC 0.0 0.0 - 0.2 %    Comment: Performed at [facility], [address], [place], [place] [address]  POC urine preg, ED     Status: None   Collection Time: 02/15/23  6:15 PM  Result Value Ref Range   Preg Test, Ur Negative Negative   CT ABDOMEN PELVIS W CONTRAST Result Date: 02/15/2023 CLINICAL DATA:  Left lower quadrant pain. EXAM: CT ABDOMEN AND PELVIS WITH CONTRAST TECHNIQUE: Multidetector CT imaging of the abdomen and pelvis was performed using the standard protocol following bolus administration of intravenous contrast. RADIATION DOSE REDUCTION: This exam was performed according to the departmental dose-optimization program which includes automated exposure control, adjustment of the mA and/or kV according to patient size and/or use  of iterative reconstruction technique. CONTRAST:  OMNIPAQUE  IOHEXOL  300 MG/ML  SOLN COMPARISON:  September 25, 2021 FINDINGS: Lower chest: No acute abnormality. Hepatobiliary: No focal liver abnormality is seen. Multiple gallstones are seen within the lumen of an otherwise normal-appearing gallbladder. There is no evidence of biliary dilatation. Pancreas: Unremarkable. No pancreatic ductal dilatation or surrounding inflammatory changes. Spleen: Normal in size without focal  abnormality. Adrenals/Urinary Tract: Adrenal glands are unremarkable. Kidneys are normal, without renal calculi, focal lesion, or hydronephrosis. Mild thickening of the anterior wall of the urinary bladder is seen, adjacent to an inflamed segment of sigmoid colon. Stomach/Bowel: Stomach is within normal limits. Appendix appears normal. No evidence of bowel dilatation markedly inflamed diverticula are seen within the proximal to mid sigmoid colon. A 4 mm focus of extraluminal air is suspected within the region anterior to the inflamed diverticulum (axial CT image 80, CT series 2/sagittal reformatted image 83, CT series 5). Vascular/Lymphatic: Very mild aortic atherosclerosis. No enlarged abdominal or pelvic lymph nodes. Reproductive: An IUD is seen within an otherwise normal appearing uterus. The bilateral adnexa are unremarkable. Other: No abdominal wall hernia or abnormality. Surgical coils are seen within the anterior aspect of the pelvis. No abdominopelvic ascites. Musculoskeletal: No acute or significant osseous findings. IMPRESSION: 1. Marked severity sigmoid diverticulitis with a suspected 4 mm focus of extraluminal air within the region anterior to the inflamed diverticulum consistent with a localized perforation. 2. Cholelithiasis. 3. IUD in place. 4. Aortic atherosclerosis. Aortic Atherosclerosis (ICD10-I70.0). Electronically Signed   By: Suzen Dials M.D.   On: 02/15/2023 18:59    Pending Labs Unresulted Labs (From  admission, onward)     Start     Ordered   02/16/23 0500  Lipase, blood  Tomorrow morning,   R        02/15/23 2008   02/16/23 0500  Vitamin B12  Tomorrow morning,   R        02/15/23 2008   02/16/23 0500  Folate  Tomorrow morning,   R        02/15/23 2008   02/16/23 0500  Comprehensive metabolic panel  Tomorrow morning,   R        02/15/23 2010   02/16/23 0500  CBC  Tomorrow morning,   R        02/15/23 2010   02/16/23 0500  Magnesium  Tomorrow morning,   R        02/15/23 2010   02/16/23 0500  Phosphorus  Tomorrow morning,   R        02/15/23 2010   02/15/23 2010  CBC  (heparin )  Once,   R       Comments: Baseline for heparin  therapy IF NOT ALREADY DRAWN.  Notify MD if PLT < 100 K.    02/15/23 2010   02/15/23 2010  Creatinine, serum  (heparin )  Once,   R       Comments: Baseline for heparin  therapy IF NOT ALREADY DRAWN.    02/15/23 2010   02/15/23 2009  HIV Antibody (routine testing w rflx)  (HIV Antibody (Routine testing w reflex) panel)  Once,   R        02/15/23 2010            Vitals/Pain Today's Vitals   02/15/23 1910 02/15/23 1920 02/15/23 1930 02/15/23 2000  BP:  (!) 103/52 109/62 100/62  Pulse:  91 80 75  Resp:      Temp:      TempSrc:      SpO2:  98% 98% 96%  PainSc: 10-Worst pain ever       Isolation Precautions No active isolations  Medications Medications  morphine  (PF) 2 MG/ML injection 2 mg (has no administration in time range)  heparin  injection 5,000 Units (has no administration in time range)  acetaminophen  (TYLENOL ) tablet 650 mg (has no administration in time range)  Or  acetaminophen  (TYLENOL ) suppository 650 mg (has no administration in time range)  ondansetron  (ZOFRAN ) tablet 4 mg (has no administration in time range)    Or  ondansetron  (ZOFRAN ) injection 4 mg (has no administration in time range)  piperacillin -tazobactam (ZOSYN ) IVPB 3.375 g (has no administration in time range)  ondansetron  (ZOFRAN ) injection 4 mg (4 mg  Intravenous Given 02/15/23 1635)  fentaNYL  (SUBLIMAZE ) injection 50 mcg (50 mcg Intravenous Given 02/15/23 1635)  iohexol  (OMNIPAQUE ) 300 MG/ML solution 100 mL (100 mLs Intravenous Contrast Given 02/15/23 1829)  piperacillin -tazobactam (ZOSYN ) IVPB 3.375 g (0 g Intravenous Stopped 02/15/23 1957)  morphine  (PF) 4 MG/ML injection 4 mg (4 mg Intravenous Given 02/15/23 1921)    Mobility walks     Focused Assessments    R Recommendations: See Admitting Provider Note  Report given to:   Additional Notes:

## 2023-02-15 NOTE — ED Provider Notes (Signed)
 [place] [facility] DEPARTMENT AT [facility] Provider Note   CSN: [id number] Arrival date & time: 02/15/23  1308     History  Chief Complaint  Patient presents with   Abdominal Pain    Isabella Walker is a 43 y.o. female.   Abdominal Pain Associated symptoms: no chest pain, no chills, no cough, no diarrhea, no dysuria, no fever, no nausea, no shortness of breath and no vomiting        Isabella Walker is a 43 y.o. female past medical history of PTSD, type 2 diabetes, IBS, hypertension and thyroid disease who presents to the Emergency Department complaining of left lower quadrant pain that began yesterday.  Describes pain as sharp and constant.  Pain also moves slightly to the right lower abdomen as well.  She states that she had diverticulitis in the past and her current pain feels similar.  She denies any nausea vomiting or diarrhea.  No black or bloody stools.  No fever or chills    Home Medications Prior to Admission medications   Medication Sig Start Date End Date Taking? Authorizing Provider  ACCU-CHEK GUIDE test strip  08/15/21   [provider]  albuterol  (VENTOLIN  HFA) 108 (90 Base) MCG/ACT inhaler albuterol  sulfate HFA 90 mcg/actuation aerosol inhaler  INHALE 2 INHALATIONS INTO THE LUNGS EVERY 4 TO 6 HOURS AS NEEDED FOR SHORTNESS OF BREATH OR WHEEZING OR CHEST TIGHTNESS Patient not taking: Reported on 10/17/2022    [provider]  busPIRone (BUSPAR) 10 MG tablet Take 10 mg by mouth 2 (two) times daily. Patient not taking: Reported on 10/17/2022 06/19/21   [provider]  escitalopram (LEXAPRO) 20 MG tablet Take 20 mg by mouth daily. Patient not taking: Reported on 10/17/2022 06/07/21   [provider]  gabapentin (NEURONTIN) 100 MG capsule Take 100 mg by mouth 3 (three) times daily as needed (as needed for anxiety). Patient not taking: Reported on 10/17/2022 06/26/21   [provider]  Galcanezumab -gnlm (EMGALITY )  120 MG/ML SOAJ Inject 120 mg into the skin every 28 (twenty-eight) days. Patient not taking: Reported on 10/17/2022 08/31/21   Skeet Juliene SAUNDERS, DO  levonorgestrel (MIRENA) 20 MCG/24HR IUD 1 each by Intrauterine route once.     [provider]  metFORMIN (GLUCOPHAGE) 1000 MG tablet Take 1,000 mg by mouth 2 (two) times daily. Patient not taking: Reported on 10/17/2022 05/24/20   [provider]  pantoprazole  (PROTONIX ) 20 MG tablet Take 1 tablet (20 mg total) by mouth daily. Patient not taking: Reported on 10/17/2022 10/26/21   Adolph Tinnie BRAVO, PA-C  simvastatin  (ZOCOR ) 40 MG tablet SMARTSIG:1 Tablet(s) By Mouth Every Evening Patient not taking: Reported on 10/17/2022 06/07/21   [provider]  SPIRIVA RESPIMAT 2.5 MCG/ACT AERS SMARTSIG:2 Puff(s) Via Inhaler Daily Patient not taking: Reported on 10/17/2022 08/17/21   [provider]  sucralfate  (CARAFATE ) 1 g tablet Take 1 tablet (1 g total) by mouth 4 (four) times daily -  with meals and at bedtime. Patient not taking: Reported on 10/17/2022 10/26/21 11/25/21  Adolph Tinnie BRAVO, PA-C  SUMAtriptan  (IMITREX ) 100 MG tablet Take 1 tablet earliest onset of migraine.  May repeat in 2 hours if headache persists or recurs.  Maximum 2 tablets in 24 hours Patient not taking: Reported on 10/17/2022 02/15/21   Skeet Juliene SAUNDERS, DO  topiramate  (TOPAMAX ) 100 MG tablet Take 1.5 tablets (150 mg total) by mouth 2 (two) times daily. Patient not taking: Reported on 10/17/2022 10/15/21  Skeet, Adam R, DO  triamterene -hydrochlorothiazide  (DYAZIDE ) 37.5-25 MG capsule Take 1 capsule by mouth daily. Patient not taking: Reported on 10/17/2022 04/01/20   [provider]      Allergies    Bextra [valdecoxib]    Review of Systems   Review of Systems  Constitutional:  Negative for chills and fever.  Respiratory:  Negative for cough and shortness of breath.   Cardiovascular:  Negative for chest pain.  Gastrointestinal:  Positive for  abdominal pain. Negative for diarrhea, nausea and vomiting.  Genitourinary:  Negative for dysuria.  Neurological:  Negative for weakness and numbness.    Physical Exam Updated Vital Signs BP (!) 117/36 (BP Location: Left Arm)   Pulse 87   Temp 98.3 F (36.8 C) (Oral)   Resp 18   SpO2 98%  Physical Exam Vitals and nursing note reviewed.  Constitutional:      General: She is not in acute distress.    Appearance: Normal appearance. She is well-developed. She is not toxic-appearing.  HENT:     Mouth/Throat:     Mouth: Mucous membranes are moist.  Cardiovascular:     Rate and Rhythm: Normal rate and regular rhythm.     Pulses: Normal pulses.  Pulmonary:     Effort: Pulmonary effort is normal.  Abdominal:     General: There is no distension.     Palpations: Abdomen is soft.     Tenderness: There is abdominal tenderness. There is no guarding.     Comments: Tender to palpation left lower quadrant.  Some milder right lower quadrant tenderness as well.  No guarding or rebound.  Abdomen is soft  Musculoskeletal:        General: Normal range of motion.  Skin:    General: Skin is warm.     Capillary Refill: Capillary refill takes less than 2 seconds.  Neurological:     General: No focal deficit present.     Mental Status: She is alert.     Sensory: No sensory deficit.     Motor: No weakness.     ED Results / Procedures / Treatments   Labs (all labs ordered are listed, but only abnormal results are displayed) Labs Reviewed  LIPASE, BLOOD - Abnormal; Notable for the following components:      Result Value   Lipase 78 (*)    All other components within normal limits  COMPREHENSIVE METABOLIC PANEL - Abnormal; Notable for the following components:   CO2 21 (*)    Glucose, Bld 125 (*)    AST 12 (*)    All other components within normal limits  CBC - Abnormal; Notable for the following components:   WBC 11.6 (*)    Hemoglobin 15.3 (*)    HCT 46.5 (*)    MCV 103.6 (*)    MCH  34.1 (*)    All other components within normal limits  URINALYSIS, ROUTINE W REFLEX MICROSCOPIC - Abnormal; Notable for the following components:   Hgb urine dipstick SMALL (*)    All other components within normal limits  POC URINE PREG, ED    EKG None  Radiology No results found.  Procedures Procedures    Medications Ordered in ED Medications  ondansetron  (ZOFRAN ) injection 4 mg (4 mg Intravenous Given 02/15/23 1635)  fentaNYL  (SUBLIMAZE ) injection 50 mcg (50 mcg Intravenous Given 02/15/23 1635)  iohexol  (OMNIPAQUE ) 300 MG/ML solution 100 mL (100 mLs Intravenous Contrast Given 02/15/23 1829)    ED Course/ Medical Decision Making/ A&P  Medical Decision Making  Patient here with left lower quadrant pain since yesterday.  Pain has been constant since onset.  No associated nausea vomiting or diarrhea.  No fever or chills.  Patient reports pain similar to previous episode of acute diverticulitis.   On exam, patient nontoxic-appearing.  Vital signs reassuring.  Differential would include but not limited to recurrent diverticulitis with/without complication, gastroenteritis, UTI, constipation, SBO, acute surgical abdomen also considered but felt less likely  Amount and/or Complexity of Data Reviewed Labs: ordered.    Details: Labs without significant leukocytosis.  Chemistries without derangement.  Urine pregnancy test negative.  Lipase mildly elevated at 78 urinalysis without evidence of infection Radiology: ordered.    Details: CT abdomen pelvis results pending Discussion of management or test interpretation with external provider(s):   Possible recurrent diverticulitis.  CT abdomen pelvis results are pending.  Patient signed out to [facility].  PA-C who will review CT results and arrange disposition.  If results positive and without complication, will likely be discharged home.    Risk Prescription drug management.           Final  Clinical Impression(s) / ED Diagnoses Final diagnoses:  Diverticulitis    Rx / DC Orders ED Discharge Orders     None         Herlinda Milling, PA-C 02/15/23 1907    Elnor Jayson LABOR, DO 02/15/23 1928

## 2023-02-15 NOTE — ED Notes (Signed)
 Patient transported to CT

## 2023-02-15 NOTE — H&P (Signed)
 History and Physical    Patient: Isabella Walker FMW:[id number] DOB: 01/19/1980 DOA: 02/15/2023 DOS: the patient was seen and examined on 02/15/2023 PCP: [facility], [facility]  Patient coming from: Home  Chief Complaint:  Chief Complaint  Patient presents with   Abdominal Pain   HPI: Isabella Walker is a 43 y.o. female with medical history significant of diverticulitis who presents to the emergency department due to 1 day onset of left lower quadrant abdominal pain.  Pain was sharp, piercing, constant and was rated as 9/10 on pain scale.  It has since extended to right lower abdomen.  Pain was similar to prior episodes of diverticulitis.  She denies nausea, vomiting, diarrhea, fever, chills, blood in stool.  ED Course:  In the emergency department, BP was 126/58, other vital signs were within normal range.  Workup in the ED showed macrocytic anemia, WBC 11.6, platelets 272.  BMP was normal except for bicarb of 21 and blood glucose of 125, lipase 78, urinalysis was normal. CT abdomen and pelvis with contrast showed marked severity sigmoid diverticulitis with a suspected 4 mm focus of extraluminal air within the region anterior to the inflamed diverticulum consistent with a localized perforation. Patient was treated with IV fentanyl  and morphine .  Zofran  was given and patient was started on IV Zosyn . General surgery (Dr. Kallie) was consulted and will see patient in the morning per ED PA.  Review of Systems: Review of systems as noted in the HPI. All other systems reviewed and are negative.   Past Medical History:  Diagnosis Date   Anxiety    Bipolar 1 disorder (HCC)    Complication of anesthesia    Depression complicating pregnancy, postpartum 2012   Diabetes mellitus without complication (HCC)    GERD (gastroesophageal reflux disease)    Headache(784.0)    hx of migraines-last one few months ago.   High cholesterol    Hypertension    IBS (irritable bowel syndrome)     Migraines    PTSD (post-traumatic stress disorder)    Thyroid disease    Past Surgical History:  Procedure Laterality Date   CARPAL TUNNEL RELEASE Left 08/20/2018   Procedure: CARPAL TUNNEL RELEASE;  Surgeon: Margrette Taft BRAVO, MD;  Location: [facility];  Service: Orthopedics;  Laterality: Left;   INSERTION OF MESH N/A 05/25/2012   Procedure: INSERTION OF MESH;  Surgeon: Oneil DELENA Budge, MD;  Location: [facility];  Service: General;  Laterality: N/A;   ORIF KNEE DISLOCATION Right    VENTRAL HERNIA REPAIR N/A 05/25/2012   Procedure: LAPAROSCOPIC VENTRAL HERNIA;  Surgeon: Oneil DELENA Budge, MD;  Location: [facility];  Service: General;  Laterality: N/A;    Social History:  reports that she has been smoking cigarettes. She started smoking about 21 years ago. She has a 15 pack-year smoking history. She has never used smokeless tobacco. She reports current drug use. Drug: Marijuana. She reports that she does not drink alcohol.   Allergies  Allergen Reactions   Bextra [Valdecoxib] Other (See Comments)    SEVERE NOSE BLEEDS    Family History  Problem Relation Age of Onset   High blood pressure Mother    Diverticulitis Mother        had colostomy   Diabetes Father    High blood pressure Father    Diverticulitis Maternal Grandmother        had to have colon surgery   Cancer Paternal Aunt        ovarian   Colon  cancer Neg Hx      Prior to Admission medications   Medication Sig Start Date End Date Taking? Authorizing Provider  Aspirin-Acetaminophen  (GOODYS BODY PAIN PO) Take 1 Package by mouth daily as needed (pain).   Yes [provider]  levonorgestrel (MIRENA) 20 MCG/24HR IUD 1 each by Intrauterine route once.    Yes [provider]  ACCU-CHEK GUIDE test strip  08/15/21   [provider]    Physical Exam: BP 100/62   Pulse 75   Temp 98.3 F (36.8 C) (Oral)   Resp 18   SpO2 96%   General: 43 y.o. year-old female well developed well nourished in no acute distress.   Alert and oriented x3. HEENT: NCAT, EOMI Neck: Supple, trachea medial Cardiovascular: Regular rate and rhythm with no rubs or gallops.  No thyromegaly or JVD noted.  No lower extremity edema. 2/4 pulses in all 4 extremities. Respiratory: Clear to auscultation with no wheezes or rales. Good inspiratory effort. Abdomen: Soft, tender to palpation of lower quadrants (LLQ . RLQ) without guarding.  Nondistended with normal bowel sounds x4 quadrants. Muskuloskeletal: No cyanosis, clubbing or edema noted bilaterally Neuro: CN II-XII intact, strength 5/5 x 4, sensation, reflexes intact Skin: No ulcerative lesions noted or rashes Psychiatry: Judgement and insight appear normal. Mood is appropriate for condition and setting          Labs on Admission:  Basic Metabolic Panel: Recent Labs  Lab 02/15/23 1355  NA 135  K 4.3  CL 106  CO2 21*  GLUCOSE 125*  BUN 20  CREATININE 0.73  CALCIUM 9.1   Liver Function Tests: Recent Labs  Lab 02/15/23 1355  AST 12*  ALT 14  ALKPHOS 40  BILITOT 1.1  PROT 7.5  ALBUMIN 4.0   Recent Labs  Lab 02/15/23 1355  LIPASE 78*   No results for input(s): AMMONIA in the last 168 hours. CBC: Recent Labs  Lab 02/15/23 1355  WBC 11.6*  HGB 15.3*  HCT 46.5*  MCV 103.6*  PLT 272   Cardiac Enzymes: No results for input(s): CKTOTAL, CKMB, CKMBINDEX, TROPONINI in the last 168 hours.  BNP (last 3 results) No results for input(s): BNP in the last 8760 hours.  ProBNP (last 3 results) No results for input(s): PROBNP in the last 8760 hours.  CBG: No results for input(s): GLUCAP in the last 168 hours.  Radiological Exams on Admission: CT ABDOMEN PELVIS W CONTRAST Result Date: 02/15/2023 CLINICAL DATA:  Left lower quadrant pain. EXAM: CT ABDOMEN AND PELVIS WITH CONTRAST TECHNIQUE: Multidetector CT imaging of the abdomen and pelvis was performed using the standard protocol following bolus administration of intravenous contrast. RADIATION  DOSE REDUCTION: This exam was performed according to the departmental dose-optimization program which includes automated exposure control, adjustment of the mA and/or kV according to patient size and/or use of iterative reconstruction technique. CONTRAST:  OMNIPAQUE  IOHEXOL  300 MG/ML  SOLN COMPARISON:  September 25, 2021 FINDINGS: Lower chest: No acute abnormality. Hepatobiliary: No focal liver abnormality is seen. Multiple gallstones are seen within the lumen of an otherwise normal-appearing gallbladder. There is no evidence of biliary dilatation. Pancreas: Unremarkable. No pancreatic ductal dilatation or surrounding inflammatory changes. Spleen: Normal in size without focal abnormality. Adrenals/Urinary Tract: Adrenal glands are unremarkable. Kidneys are normal, without renal calculi, focal lesion, or hydronephrosis. Mild thickening of the anterior wall of the urinary bladder is seen, adjacent to an inflamed segment of sigmoid colon. Stomach/Bowel: Stomach is within normal limits. Appendix appears normal. No  evidence of bowel dilatation markedly inflamed diverticula are seen within the proximal to mid sigmoid colon. A 4 mm focus of extraluminal air is suspected within the region anterior to the inflamed diverticulum (axial CT image 80, CT series 2/sagittal reformatted image 83, CT series 5). Vascular/Lymphatic: Very mild aortic atherosclerosis. No enlarged abdominal or pelvic lymph nodes. Reproductive: An IUD is seen within an otherwise normal appearing uterus. The bilateral adnexa are unremarkable. Other: No abdominal wall hernia or abnormality. Surgical coils are seen within the anterior aspect of the pelvis. No abdominopelvic ascites. Musculoskeletal: No acute or significant osseous findings. IMPRESSION: 1. Marked severity sigmoid diverticulitis with a suspected 4 mm focus of extraluminal air within the region anterior to the inflamed diverticulum consistent with a localized perforation. 2.  Cholelithiasis. 3. IUD in place. 4. Aortic atherosclerosis. Aortic Atherosclerosis (ICD10-I70.0). Electronically Signed   By: Suzen Dials M.D.   On: 02/15/2023 18:59    EKG: I independently viewed the EKG done and my findings are as followed: EKG was not done in the ED  Assessment/Plan Present on Admission:  Sigmoid diverticulitis  Principal Problem:   Sigmoid diverticulitis Active Problems:   Elevated lipase   Elevated MCV   Obesity, Class II, BMI 35-39.9  Sigmoid diverticulitis Continue IV Zosyn  Continue IV morphine  2 mg q.4h p.r.n. for moderate to severe pain Continue IV Zofran  p.r.n. Continue clear liquid diet with plan to advance diet as tolerated General Surgery (Dr. Kallie) was consulted and will follow-up with patient in the morning   Elevated lipase Lipase 78 CT abdomen pelvis showed unremarkable pancreas with no pancreatic ductal dilatation or surrounding inflammatory changes Continue to monitor lipase level  Elevated MCV MCV 103.6; vitamin B12 and folate levels will be checked  Obesity class II (BMI 36.7) Diet and lifestyle modification   DVT prophylaxis: Heparin  subcu  Code Status: Full code  Family Communication: None at bedside  Consults: General Surgery   Severity of Illness: The appropriate patient status for this patient is INPATIENT. Inpatient status is judged to be reasonable and necessary in order to provide the required intensity of service to ensure the patient's safety. The patient's presenting symptoms, physical exam findings, and initial radiographic and laboratory data in the context of their chronic comorbidities is felt to place them at high risk for further clinical deterioration. Furthermore, it is not anticipated that the patient will be medically stable for discharge from the hospital within 2 midnights of admission.   * I certify that at the point of admission it is my clinical judgment that the patient will require inpatient  hospital care spanning beyond 2 midnights from the point of admission due to high intensity of service, high risk for further deterioration and high frequency of surveillance required.*  Author: Rockelle Heuerman, DO 02/15/2023 8:33 PM  For on call review [email or web address].

## 2023-02-15 NOTE — ED Triage Notes (Signed)
 Pt states that yesterday evening she began having LLQ abd pain similar to previous diverticulitis pain. Pt states that overnight her pain became more generalized. Denies N/V/D. No bloody stools.

## 2023-02-15 NOTE — Progress Notes (Signed)
 02/15/2023 2040 Received pt to room 306 from ED with Dx of Diverticulitis.  Pt is A&Ox4, c/o slight HA.  Oriented to room, call light and bed.  Call bell in reach, family at bedside. Isabella Walker

## 2023-02-16 ENCOUNTER — Other Ambulatory Visit: Payer: Self-pay

## 2023-02-16 ENCOUNTER — Encounter (HOSPITAL_COMMUNITY): Payer: Self-pay | Admitting: Internal Medicine

## 2023-02-16 DIAGNOSIS — Z720 Tobacco use: Secondary | ICD-10-CM | POA: Diagnosis present

## 2023-02-16 DIAGNOSIS — K5732 Diverticulitis of large intestine without perforation or abscess without bleeding: Secondary | ICD-10-CM | POA: Diagnosis not present

## 2023-02-16 LAB — COMPREHENSIVE METABOLIC PANEL
ALT: 13 U/L (ref 0–44)
AST: 11 U/L — ABNORMAL LOW (ref 15–41)
Albumin: 3.5 g/dL (ref 3.5–5.0)
Alkaline Phosphatase: 38 U/L (ref 38–126)
Anion gap: 10 (ref 5–15)
BUN: 15 mg/dL (ref 6–20)
CO2: 19 mmol/L — ABNORMAL LOW (ref 22–32)
Calcium: 8.7 mg/dL — ABNORMAL LOW (ref 8.9–10.3)
Chloride: 106 mmol/L (ref 98–111)
Creatinine, Ser: 0.61 mg/dL (ref 0.44–1.00)
GFR, Estimated: >60 mL/min (ref >60)
Glucose, Bld: 113 mg/dL — ABNORMAL HIGH (ref 70–99)
Potassium: 3.8 mmol/L (ref 3.5–5.1)
Sodium: 135 mmol/L (ref 135–145)
Total Bilirubin: 1.6 mg/dL — ABNORMAL HIGH (ref 0.0–1.2)
Total Protein: 6.8 g/dL (ref 6.5–8.1)

## 2023-02-16 LAB — CBC
HCT: 43.4 % (ref 36.0–46.0)
Hemoglobin: 14 g/dL (ref 12.0–15.0)
MCH: 33.7 pg (ref 26.0–34.0)
MCHC: 32.3 g/dL (ref 30.0–36.0)
MCV: 104.6 fL — ABNORMAL HIGH (ref 80.0–100.0)
Platelets: 228 10*3/uL (ref 150–400)
RBC: 4.15 MIL/uL (ref 3.87–5.11)
RDW: 13.5 % (ref 11.5–15.5)
WBC: 10.1 10*3/uL (ref 4.0–10.5)
nRBC: 0 % (ref 0.0–0.2)

## 2023-02-16 LAB — MAGNESIUM: Magnesium: 1.9 mg/dL (ref 1.7–2.4)

## 2023-02-16 LAB — FOLATE: Folate: 19.6 ng/mL (ref >5.9)

## 2023-02-16 LAB — VITAMIN B12: Vitamin B-12: 240 pg/mL (ref 180–914)

## 2023-02-16 LAB — PHOSPHORUS: Phosphorus: 3.1 mg/dL (ref 2.5–4.6)

## 2023-02-16 LAB — LIPASE, BLOOD: Lipase: 26 U/L (ref 11–51)

## 2023-02-16 LAB — HIV ANTIBODY (ROUTINE TESTING W REFLEX): HIV Screen 4th Generation wRfx: NONREACTIVE

## 2023-02-16 MED ORDER — DOCUSATE SODIUM 100 MG PO CAPS
100.0000 mg | ORAL_CAPSULE | Freq: Two times a day (BID) | ORAL | Status: DC
  Administered 2023-02-16 (×2): 100 mg via ORAL
  Filled 2023-02-16 (×3): qty 1

## 2023-02-16 MED ORDER — PANTOPRAZOLE SODIUM 40 MG PO TBEC
40.0000 mg | DELAYED_RELEASE_TABLET | Freq: Every day | ORAL | Status: DC
  Administered 2023-02-16 – 2023-02-18 (×3): 40 mg via ORAL
  Filled 2023-02-16 (×3): qty 1

## 2023-02-16 MED ORDER — OXYCODONE HCL 5 MG PO TABS
10.0000 mg | ORAL_TABLET | Freq: Four times a day (QID) | ORAL | Status: DC | PRN
  Administered 2023-02-16 – 2023-02-18 (×6): 10 mg via ORAL
  Filled 2023-02-16 (×8): qty 2

## 2023-02-16 MED ORDER — NICOTINE 21 MG/24HR TD PT24
21.0000 mg | MEDICATED_PATCH | Freq: Every day | TRANSDERMAL | Status: DC
  Administered 2023-02-16 – 2023-02-18 (×3): 21 mg via TRANSDERMAL
  Filled 2023-02-16 (×3): qty 1

## 2023-02-16 NOTE — Plan of Care (Signed)

## 2023-02-16 NOTE — Consult Note (Signed)
 [facility] Consult  Reason for Consult: Sigmoid diverticulitis with microperforation  Referring Physician: Dr. Pearlean  Chief Complaint   Abdominal Pain     HPI: Isabella Walker is a 43 y.o. female with a history of diverticulitis a few years ago who comes in with LLQ pain and no associated nausea or  vomiting. She has been constipated. She says this episode is worse than the prior. She did not get a colonoscopy in the past with her episode. She says she did see GI who recommended but it as never scheduled. She has a mom and grandmother that had diverticulitis and colostomies. She has lost weight in the past few years with diet and activity per report.     Past Medical History:  Diagnosis Date   Anxiety    Bipolar 1 disorder (HCC)    Complication of anesthesia    Depression complicating pregnancy, postpartum 2012   Diabetes mellitus without complication (HCC)    GERD (gastroesophageal reflux disease)    Headache(784.0)    hx of migraines-last one few months ago.   High cholesterol    Hypertension    IBS (irritable bowel syndrome)    Migraines    PTSD (post-traumatic stress disorder)    Thyroid disease     Past Surgical History:  Procedure Laterality Date   CARPAL TUNNEL RELEASE Left 08/20/2018   Procedure: CARPAL TUNNEL RELEASE;  Surgeon: Margrette Taft BRAVO, MD;  Location: [facility];  Service: Orthopedics;  Laterality: Left;   INSERTION OF MESH N/A 05/25/2012   Procedure: INSERTION OF MESH;  Surgeon: Oneil DELENA Budge, MD;  Location: [facility];  Service: General;  Laterality: N/A;   ORIF KNEE DISLOCATION Right    VENTRAL HERNIA REPAIR N/A 05/25/2012   Procedure: LAPAROSCOPIC VENTRAL HERNIA;  Surgeon: Oneil DELENA Budge, MD;  Location: [facility];  Service: General;  Laterality: N/A;    Family History  Problem Relation Age of Onset   High blood pressure Mother    Diverticulitis Mother        had colostomy   Diabetes Father    High blood pressure Father     Diverticulitis Maternal Grandmother        had to have colon surgery   Cancer Paternal Aunt        ovarian   Colon cancer Neg Hx     Social History   Tobacco Use   Smoking status: Every Day    Current packs/day: 0.00    Average packs/day: 1 pack/day for 15.0 years (15.0 ttl pk-yrs)    Types: Cigarettes    Start date: 01/31/2002    Last attempt to quit: 01/31/2017    Years since quitting: 6.0   Smokeless tobacco: Never  Vaping Use   Vaping status: Former  Substance Use Topics   Alcohol use: Never   Drug use: Yes    Types: Marijuana    Medications: I have reviewed the patient's current medications. Prior to Admission:  Medications Prior to Admission  Medication Sig Dispense Refill Last Dose/Taking   Aspirin-Acetaminophen  (GOODYS BODY PAIN PO) Take 1 Package by mouth daily as needed (pain).   02/14/2023 Noon   levonorgestrel (MIRENA) 20 MCG/24HR IUD 1 each by Intrauterine route once.    Taking   ACCU-CHEK GUIDE test strip       Scheduled:  docusate sodium   100 mg Oral BID   heparin   5,000 Units Subcutaneous Q8H   Continuous:  piperacillin -tazobactam (ZOSYN )  IV 3.375 g (02/16/23 0518)  PRN:acetaminophen  **OR** acetaminophen , morphine  injection, ondansetron  **OR** ondansetron  (ZOFRAN ) IV  Allergies  Allergen Reactions   Bextra [Valdecoxib] Other (See Comments)    SEVERE NOSE BLEEDS     ROS:  A comprehensive review of systems was negative except for: Gastrointestinal: positive for abdominal pain  Blood pressure 98/60, pulse 78, temperature 98.4 F (36.9 C), temperature source Oral, resp. rate 17, height 5' 3 (1.6 m), weight 107.3 kg, SpO2 96%. Physical Exam Vitals reviewed.  HENT:     Head: Normocephalic.  Cardiovascular:     Rate and Rhythm: Normal rate.  Pulmonary:     Effort: Pulmonary effort is normal.  Abdominal:     General: There is no distension.     Palpations: Abdomen is soft.     Tenderness: There is abdominal tenderness in the left lower  quadrant.  Musculoskeletal:     Comments: Moves all extremities   Skin:    General: Skin is warm.  Neurological:     General: No focal deficit present.     Mental Status: She is alert and oriented to person, place, and time.  Psychiatric:        Mood and Affect: Mood normal.        Behavior: Behavior normal.     Results: Results for orders placed or performed during the hospital encounter of 02/15/23 (from the past 48 hours)  Urinalysis, Routine w reflex microscopic -Urine, Clean Catch     Status: Abnormal   Collection Time: 02/15/23  1:18 PM  Result Value Ref Range   Color, Urine YELLOW YELLOW   APPearance CLEAR CLEAR   Specific Gravity, Urine 1.020 1.005 - 1.030   pH 5.0 5.0 - 8.0   Glucose, UA NEGATIVE NEGATIVE mg/dL   Hgb urine dipstick SMALL (A) NEGATIVE   Bilirubin Urine NEGATIVE NEGATIVE   Ketones, ur NEGATIVE NEGATIVE mg/dL   Protein, ur NEGATIVE NEGATIVE mg/dL   Nitrite NEGATIVE NEGATIVE   Leukocytes,Ua NEGATIVE NEGATIVE   RBC / HPF 0-5 0 - 5 RBC/hpf   WBC, UA 0-5 0 - 5 WBC/hpf   Bacteria, UA NONE SEEN NONE SEEN   Squamous Epithelial / HPF 0-5 0 - 5 /HPF    Comment: Performed at [facility], [address] [place], [place] [address]  Lipase, blood     Status: Abnormal   Collection Time: 02/15/23  1:55 PM  Result Value Ref Range   Lipase 78 (H) 11 - 51 U/L    Comment: Performed at [facility], [address], [place], [place] [address]  Comprehensive metabolic panel     Status: Abnormal   Collection Time: 02/15/23  1:55 PM  Result Value Ref Range   Sodium 135 135 - 145 mmol/L   Potassium 4.3 3.5 - 5.1 mmol/L   Chloride 106 98 - 111 mmol/L   CO2 21 (L) 22 - 32 mmol/L   Glucose, Bld 125 (H) 70 - 99 mg/dL    Comment: Glucose reference range applies only to samples taken after fasting for at least 8 hours.   BUN 20 6 - 20 mg/dL   Creatinine, Ser 9.26 0.44 - 1.00 mg/dL   Calcium 9.1 8.9 - 89.6 mg/dL   Total Protein 7.5 6.5 - 8.1 g/dL   Albumin 4.0 3.5 - 5.0  g/dL   AST 12 (L) 15 - 41 U/L   ALT 14 0 - 44 U/L   Alkaline Phosphatase 40 38 - 126 U/L   Total Bilirubin 1.1 0.0 - 1.2 mg/dL  GFR, Estimated >60 >60 mL/min    Comment: (NOTE) Calculated using the CKD-EPI Creatinine Equation (2021)    Anion gap 8 5 - 15    Comment: Performed at [facility], [address], [place], [place] [address]  CBC     Status: Abnormal   Collection Time: 02/15/23  1:55 PM  Result Value Ref Range   WBC 11.6 (H) 4.0 - 10.5 K/uL   RBC 4.49 3.87 - 5.11 MIL/uL   Hemoglobin 15.3 (H) 12.0 - 15.0 g/dL   HCT 53.4 (H) 63.9 - 53.9 %   MCV 103.6 (H) 80.0 - 100.0 fL   MCH 34.1 (H) 26.0 - 34.0 pg   MCHC 32.9 30.0 - 36.0 g/dL   RDW 86.6 88.4 - 84.4 %   Platelets 272 150 - 400 K/uL   nRBC 0.0 0.0 - 0.2 %    Comment: Performed at [facility], [address], [place], [place] [address]  POC urine preg, ED     Status: None   Collection Time: 02/15/23  6:15 PM  Result Value Ref Range   Preg Test, Ur Negative Negative  Lipase, blood     Status: None   Collection Time: 02/16/23  4:42 AM  Result Value Ref Range   Lipase 26 11 - 51 U/L    Comment: Performed at [facility], [address], [place], [place] [address]  Vitamin B12     Status: None   Collection Time: 02/16/23  4:42 AM  Result Value Ref Range   Vitamin B-12 240 180 - 914 pg/mL    Comment: (NOTE) This assay is not validated for testing neonatal or myeloproliferative syndrome specimens for Vitamin B12 levels. Performed at [facility], [address], [place], [place] [address]   Folate     Status: None   Collection Time: 02/16/23  4:42 AM  Result Value Ref Range   Folate 19.6 >5.9 ng/mL    Comment: Performed at [facility], [address] [place], [place] [address]  Comprehensive metabolic panel     Status: Abnormal   Collection Time: 02/16/23  4:42 AM  Result Value Ref Range   Sodium 135 135 - 145 mmol/L   Potassium 3.8 3.5 - 5.1 mmol/L   Chloride 106 98 - 111 mmol/L   CO2 19 (L) 22 - 32 mmol/L    Glucose, Bld 113 (H) 70 - 99 mg/dL    Comment: Glucose reference range applies only to samples taken after fasting for at least 8 hours.   BUN 15 6 - 20 mg/dL   Creatinine, Ser 9.38 0.44 - 1.00 mg/dL   Calcium 8.7 (L) 8.9 - 10.3 mg/dL   Total Protein 6.8 6.5 - 8.1 g/dL   Albumin 3.5 3.5 - 5.0 g/dL   AST 11 (L) 15 - 41 U/L   ALT 13 0 - 44 U/L   Alkaline Phosphatase 38 38 - 126 U/L   Total Bilirubin 1.6 (H) 0.0 - 1.2 mg/dL   GFR, Estimated >39 >39 mL/min    Comment: (NOTE) Calculated using the CKD-EPI Creatinine Equation (2021)    Anion gap 10 5 - 15    Comment: Performed at [facility], [address], [place], [place] [address]  CBC     Status: Abnormal   Collection Time: 02/16/23  4:42 AM  Result Value Ref Range   WBC 10.1 4.0 - 10.5 K/uL   RBC 4.15 3.87 - 5.11 MIL/uL   Hemoglobin 14.0 12.0 - 15.0 g/dL   HCT  43.4 36.0 - 46.0 %   MCV 104.6 (H) 80.0 - 100.0 fL   MCH 33.7 26.0 - 34.0 pg   MCHC 32.3 30.0 - 36.0 g/dL   RDW 86.4 88.4 - 84.4 %   Platelets 228 150 - 400 K/uL   nRBC 0.0 0.0 - 0.2 %    Comment: Performed at [facility], [address] [place], [place] [address]  Magnesium     Status: None   Collection Time: 02/16/23  4:42 AM  Result Value Ref Range   Magnesium 1.9 1.7 - 2.4 mg/dL    Comment: Performed at [facility], [address], [place], [place] [address]  Phosphorus     Status: None   Collection Time: 02/16/23  4:42 AM  Result Value Ref Range   Phosphorus 3.1 2.5 - 4.6 mg/dL    Comment: Performed at [facility], [address], [place], [place] [address]   Personally reviewed and showed patient- diverticulitis sigmoid colon, small microperforation   CT ABDOMEN PELVIS W CONTRAST Result Date: 02/15/2023 CLINICAL DATA:  Left lower quadrant pain. EXAM: CT ABDOMEN AND PELVIS WITH CONTRAST TECHNIQUE: Multidetector CT imaging of the abdomen and pelvis was performed using the standard protocol following bolus administration of intravenous contrast. RADIATION DOSE  REDUCTION: This exam was performed according to the departmental dose-optimization program which includes automated exposure control, adjustment of the mA and/or kV according to patient size and/or use of iterative reconstruction technique. CONTRAST:  OMNIPAQUE  IOHEXOL  300 MG/ML  SOLN COMPARISON:  September 25, 2021 FINDINGS: Lower chest: No acute abnormality. Hepatobiliary: No focal liver abnormality is seen. Multiple gallstones are seen within the lumen of an otherwise normal-appearing gallbladder. There is no evidence of biliary dilatation. Pancreas: Unremarkable. No pancreatic ductal dilatation or surrounding inflammatory changes. Spleen: Normal in size without focal abnormality. Adrenals/Urinary Tract: Adrenal glands are unremarkable. Kidneys are normal, without renal calculi, focal lesion, or hydronephrosis. Mild thickening of the anterior wall of the urinary bladder is seen, adjacent to an inflamed segment of sigmoid colon. Stomach/Bowel: Stomach is within normal limits. Appendix appears normal. No evidence of bowel dilatation markedly inflamed diverticula are seen within the proximal to mid sigmoid colon. A 4 mm focus of extraluminal air is suspected within the region anterior to the inflamed diverticulum (axial CT image 80, CT series 2/sagittal reformatted image 83, CT series 5). Vascular/Lymphatic: Very mild aortic atherosclerosis. No enlarged abdominal or pelvic lymph nodes. Reproductive: An IUD is seen within an otherwise normal appearing uterus. The bilateral adnexa are unremarkable. Other: No abdominal wall hernia or abnormality. Surgical coils are seen within the anterior aspect of the pelvis. No abdominopelvic ascites. Musculoskeletal: No acute or significant osseous findings. IMPRESSION: 1. Marked severity sigmoid diverticulitis with a suspected 4 mm focus of extraluminal air within the region anterior to the inflamed diverticulum consistent with a localized perforation. 2. Cholelithiasis. 3.  IUD in place. 4. Aortic atherosclerosis. Aortic Atherosclerosis (ICD10-I70.0). Electronically Signed   By: Suzen Dials M.D.   On: 02/15/2023 18:59     Assessment & Plan:  Tamanna R Dewalt is a 43 y.o. female with diverticulitis with microperforation. Doing fair.   We have discussed that diverticulitis is a spectrum of disease. We discussed that it ranges from simple diverticulitis that can be treated with oral antibiotics as an outpatient to severe cases with perforations that require emergency surgery and colostomy.  We discussed that there are cases that are intermediate or complicated and require hospitalization for IV antibiotics and sometimes require Interventional  Radiology drainage of abscesses or fluid collections that form.  We have discussed that some people still require emergency surgery if their case worsens and that during the acute inflammation and infection period a colostomy. We discussed that most people are able to be discharged with antibiotics +/- a drain, and we discuss the options of elective colectomy as an outpatient. We also discussed the potential need for a colonoscopy if there is not a recent study performed.     Soft diet IV antibiotics Colace   All questions were answered to the satisfaction of the patient.  Hopefully home in next 48 hrs    Manuelita JAYSON Pander 02/16/2023, 11:36 AM

## 2023-02-16 NOTE — Progress Notes (Addendum)
 PROGRESS NOTE  Isabella Walker, is a 43 y.o. female, DOB - [birth year], FMW:[id number]  Admit date - 02/15/2023   Admitting Physician Posey Maier, DO  Outpatient Primary MD for the patient is [facility]  LOS - 1  Chief Complaint  Patient presents with   Abdominal Pain      Brief Narrative:  43 y.o. female with medical history significant morbid obesity and GERD admitted on 02/15/2023 with acute  diverticulitis with perforation    -Assessment and Plan: 1) acute sigmoid difficulties with perforation--- surgical consult appreciated WBC 11.6 >>10.1  -Continue IV Zosyn  -As needed IV and oral pain medications -Advance diet as tolerated -As needed antiemetics  2)GERD--Protonix  as ordered  3)Morbid Obesity- -Low calorie diet, portion control and increase physical activity discussed with patient -Body mass index is 41.9 kg/m.  4)Tobacco Abuse-- -Smoking cessation counseling for 4 minutes today,  Use nicotine  patch I have discussed tobacco cessation with the patient.  I have counseled the patient regarding the negative impacts of continued tobacco use including but not limited to lung cancer, COPD, and cardiovascular disease.  I have discussed alternatives to tobacco and modalities that may help facilitate tobacco cessation including but not limited to biofeedback, hypnosis, and medications.  Total time spent with tobacco counseling was 4 minutes.  Status is: Inpatient   Disposition: The patient is from: Home              Anticipated d/c is to: Home              Anticipated d/c date is: 2 days              Patient currently is not medically stable to d/c. Barriers: Not Clinically Stable-   Code Status :  -  Code Status: Full Code   Family Communication:    (patient is alert, awake and coherent)  Discussed with sister at bedside   DVT Prophylaxis  :   - SCDs   heparin  injection 5,000 Units Start: 02/15/23 2200 SCDs Start: 02/15/23 2010   Lab Results   Component Value Date   PLT 228 02/16/2023   Inpatient Medications  Scheduled Meds:  docusate sodium   100 mg Oral BID   heparin   5,000 Units Subcutaneous Q8H   nicotine   21 mg Transdermal Daily   pantoprazole   40 mg Oral Daily   Continuous Infusions:  piperacillin -tazobactam (ZOSYN )  IV 3.375 g (02/16/23 1308)   PRN Meds:.acetaminophen  **OR** acetaminophen , morphine  injection, ondansetron  **OR** ondansetron  (ZOFRAN ) IV, oxyCODONE    Anti-infectives (From admission, onward)    Start     Dose/Rate Route Frequency Ordered Stop   02/16/23 0600  piperacillin -tazobactam (ZOSYN ) IVPB 3.375 g        3.375 g 12.5 mL/hr over 240 Minutes Intravenous Every 8 hours 02/15/23 2010     02/15/23 1915  piperacillin -tazobactam (ZOSYN ) IVPB 3.375 g        3.375 g 100 mL/hr over 30 Minutes Intravenous  Once 02/15/23 1904 02/15/23 1957      Subjective: Isabella Walker today has no fevers, no emesis,  No chest pain,   - Abdominal pain is not worse -Tolerating liquid diet well -Patient Sister at bedside, questions answered  Objective: Vitals:   02/16/23 0506 02/16/23 1314 02/16/23 1322 02/16/23 1534  BP:  (!) 88/50 (!) 98/52 (!) 105/33  Pulse:  78  73  Resp:  18    Temp:  98.9 F (37.2 C)    TempSrc:  Oral    SpO2:  98%    Weight: 107.3 kg     Height: 5' 3 (1.6 m)       Intake/Output Summary (Last 24 hours) at 02/16/2023 1714 Last data filed at 02/16/2023 1300 Gross per 24 hour  Intake 240 ml  Output --  Net 240 ml   Filed Weights   02/16/23 0506  Weight: 107.3 kg    Physical Exam  Gen:- Awake Alert,  in no apparent distress  HEENT:- [place].AT, No sclera icterus Neck-Supple Neck,No JVD,.  Lungs-  CTAB , fair symmetrical air movement CV- S1, S2 normal, regular  Abd-  +ve B.Sounds, Abd Soft, lower abdominal tenderness without significant rebound or guarding, increased truncal adiposity noted Extremity/Skin:- No  edema, pedal pulses present  Psych-affect is appropriate, oriented  x3 Neuro-no new focal deficits, no tremors  Data Reviewed: I have personally reviewed following labs and imaging studies  CBC: Recent Labs  Lab 02/15/23 1355 02/16/23 0442  WBC 11.6* 10.1  HGB 15.3* 14.0  HCT 46.5* 43.4  MCV 103.6* 104.6*  PLT 272 228   Basic Metabolic Panel: Recent Labs  Lab 02/15/23 1355 02/16/23 0442  NA 135 135  K 4.3 3.8  CL 106 106  CO2 21* 19*  GLUCOSE 125* 113*  BUN 20 15  CREATININE 0.73 0.61  CALCIUM 9.1 8.7*  MG  --  1.9  PHOS  --  3.1   GFR: Estimated Creatinine Clearance: 105.4 mL/min (by C-G formula based on SCr of 0.61 mg/dL). Liver Function Tests: Recent Labs  Lab 02/15/23 1355 02/16/23 0442  AST 12* 11*  ALT 14 13  ALKPHOS 40 38  BILITOT 1.1 1.6*  PROT 7.5 6.8  ALBUMIN 4.0 3.5   Radiology Studies: CT ABDOMEN PELVIS W CONTRAST Result Date: 02/15/2023 CLINICAL DATA:  Left lower quadrant pain. EXAM: CT ABDOMEN AND PELVIS WITH CONTRAST TECHNIQUE: Multidetector CT imaging of the abdomen and pelvis was performed using the standard protocol following bolus administration of intravenous contrast. RADIATION DOSE REDUCTION: This exam was performed according to the departmental dose-optimization program which includes automated exposure control, adjustment of the mA and/or kV according to patient size and/or use of iterative reconstruction technique. CONTRAST:  OMNIPAQUE  IOHEXOL  300 MG/ML  SOLN COMPARISON:  September 25, 2021 FINDINGS: Lower chest: No acute abnormality. Hepatobiliary: No focal liver abnormality is seen. Multiple gallstones are seen within the lumen of an otherwise normal-appearing gallbladder. There is no evidence of biliary dilatation. Pancreas: Unremarkable. No pancreatic ductal dilatation or surrounding inflammatory changes. Spleen: Normal in size without focal abnormality. Adrenals/Urinary Tract: Adrenal glands are unremarkable. Kidneys are normal, without renal calculi, focal lesion, or hydronephrosis. Mild thickening  of the anterior wall of the urinary bladder is seen, adjacent to an inflamed segment of sigmoid colon. Stomach/Bowel: Stomach is within normal limits. Appendix appears normal. No evidence of bowel dilatation markedly inflamed diverticula are seen within the proximal to mid sigmoid colon. A 4 mm focus of extraluminal air is suspected within the region anterior to the inflamed diverticulum (axial CT image 80, CT series 2/sagittal reformatted image 83, CT series 5). Vascular/Lymphatic: Very mild aortic atherosclerosis. No enlarged abdominal or pelvic lymph nodes. Reproductive: An IUD is seen within an otherwise normal appearing uterus. The bilateral adnexa are unremarkable. Other: No abdominal wall hernia or abnormality. Surgical coils are seen within the anterior aspect of the pelvis. No abdominopelvic ascites. Musculoskeletal: No acute or significant osseous findings. IMPRESSION: 1. Marked severity sigmoid diverticulitis with a suspected 4 mm focus of extraluminal air within the  region anterior to the inflamed diverticulum consistent with a localized perforation. 2. Cholelithiasis. 3. IUD in place. 4. Aortic atherosclerosis. Aortic Atherosclerosis (ICD10-I70.0). Electronically Signed   By: Suzen Dials M.D.   On: 02/15/2023 18:59    Scheduled Meds:  docusate sodium   100 mg Oral BID   heparin   5,000 Units Subcutaneous Q8H   nicotine   21 mg Transdermal Daily   pantoprazole   40 mg Oral Daily   Continuous Infusions:  piperacillin -tazobactam (ZOSYN )  IV 3.375 g (02/16/23 1308)     LOS: 1 day    Rendall Carwin M.D on 02/16/2023 at 5:14 PM  Go to [email or web address] - for contact info  [facility] - Office  [phone]  If 7PM-7AM, please contact night-coverage [email or web address] 02/16/2023, 5:14 PM

## 2023-02-16 NOTE — Progress Notes (Signed)
   02/16/23 0823  TOC Brief Assessment  Insurance and Status Reviewed  Patient has primary care physician Yes  Home environment has been reviewed From home, residential address provided (Pt reports living in a tent with spouse.)  Prior level of function: Independent  Prior/Current Home Services No current home services  Social Drivers of Health Review SDOH reviewed needs interventions  Readmission risk has been reviewed Yes  Transition of care needs transition of care needs identified, TOC will continue to follow

## 2023-02-17 DIAGNOSIS — K5732 Diverticulitis of large intestine without perforation or abscess without bleeding: Secondary | ICD-10-CM | POA: Diagnosis not present

## 2023-02-17 MED ORDER — DOCUSATE SODIUM 100 MG PO CAPS: 100.0000 mg | ORAL_CAPSULE | Freq: Two times a day (BID) | ORAL | Status: DC

## 2023-02-17 MED ORDER — SENNOSIDES-DOCUSATE SODIUM 8.6-50 MG PO TABS
1.0000 | ORAL_TABLET | Freq: Two times a day (BID) | ORAL | Status: DC
  Administered 2023-02-17 – 2023-02-18 (×3): 1 via ORAL
  Filled 2023-02-17 (×3): qty 1

## 2023-02-17 NOTE — Plan of Care (Signed)
 Pts IV became dislodged. Catheter removed, skin dry and intact. No swelling or redness noticed. 20 gauge placed on anterior right arm. Pt tolerated well. This pt also stated she had a small bowel movement.   Problem: Clinical Measurements: Goal: Ability to maintain clinical measurements within normal limits will improve Outcome: Progressing Goal: Will remain free from infection Outcome: Progressing Goal: Diagnostic test results will improve Outcome: Progressing   Problem: Elimination: Goal: Will not experience complications related to bowel motility Outcome: Progressing   Problem: Pain Managment: Goal: General experience of comfort will improve and/or be controlled Outcome: Progressing   Problem: Safety: Goal: Ability to remain free from injury will improve Outcome: Progressing

## 2023-02-17 NOTE — Plan of Care (Signed)
   Problem: Safety: Goal: Ability to remain free from injury will improve Outcome: Progressing

## 2023-02-17 NOTE — Progress Notes (Signed)
 [name]     Subjective: Some improvement. Still sore. Tolerating diet and had BM.   Objective: Vital signs in last 24 hours: Temp:  [98.1 F (36.7 C)-98.9 F (37.2 C)] 98.1 F (36.7 C) (02/10 1240) Pulse Rate:  [72-79] 78 (02/10 1240) Resp:  [17-18] 17 (02/10 1044) BP: (88-119)/(23-75) 117/53 (02/10 1248) SpO2:  [98 %-100 %] 98 % (02/10 1240) Last BM Date : 02/17/23  Intake/Output from previous day: 02/09 0701 - 02/10 0700 In: 340 [P.O.:240; IV Piggyback:100] Out: -  Intake/Output this shift: Total I/O In: 240 [P.O.:240] Out: -   General appearance: alert and no distress GI: soft, nondistended, tender LLQ   Lab Results:  Recent Labs    02/15/23 1355 02/16/23 0442  WBC 11.6* 10.1  HGB 15.3* 14.0  HCT 46.5* 43.4  PLT 272 228   BMET Recent Labs    02/15/23 1355 02/16/23 0442  NA 135 135  K 4.3 3.8  CL 106 106  CO2 21* 19*  GLUCOSE 125* 113*  BUN 20 15  CREATININE 0.73 0.61  CALCIUM 9.1 8.7*   PT/INR No results for input(s): "LABPROT", "INR" in the last 72 hours.  Studies/Results: CT ABDOMEN PELVIS W CONTRAST Result Date: 02/15/2023 CLINICAL DATA:  Left lower quadrant pain. EXAM: CT ABDOMEN AND PELVIS WITH CONTRAST TECHNIQUE: Multidetector CT imaging of the abdomen and pelvis was performed using the standard protocol following bolus administration of intravenous contrast. RADIATION DOSE REDUCTION: This exam was performed according to the departmental dose-optimization program which includes automated exposure control, adjustment of the mA and/or kV according to patient size and/or use of iterative reconstruction technique. CONTRAST:  OMNIPAQUE  IOHEXOL  300 MG/ML  SOLN COMPARISON:  September 25, 2021 FINDINGS: Lower chest: No acute abnormality. Hepatobiliary: No focal liver abnormality is seen. Multiple gallstones are seen within the lumen of an otherwise normal-appearing gallbladder. There is no evidence of biliary  dilatation. Pancreas: Unremarkable. No pancreatic ductal dilatation or surrounding inflammatory changes. Spleen: Normal in size without focal abnormality. Adrenals/Urinary Tract: Adrenal glands are unremarkable. Kidneys are normal, without renal calculi, focal lesion, or hydronephrosis. Mild thickening of the anterior wall of the urinary bladder is seen, adjacent to an inflamed segment of sigmoid colon. Stomach/Bowel: Stomach is within normal limits. Appendix appears normal. No evidence of bowel dilatation markedly inflamed diverticula are seen within the proximal to mid sigmoid colon. A 4 mm focus of extraluminal air is suspected within the region anterior to the inflamed diverticulum (axial CT image 80, CT series 2/sagittal reformatted image 83, CT series 5). Vascular/Lymphatic: Very mild aortic atherosclerosis. No enlarged abdominal or pelvic lymph nodes. Reproductive: An IUD is seen within an otherwise normal appearing uterus. The bilateral adnexa are unremarkable. Other: No abdominal wall hernia or abnormality. Surgical coils are seen within the anterior aspect of the pelvis. No abdominopelvic ascites. Musculoskeletal: No acute or significant osseous findings. IMPRESSION: 1. Marked severity sigmoid diverticulitis with a suspected 4 mm focus of extraluminal air within the region anterior to the inflamed diverticulum consistent with a localized perforation. 2. Cholelithiasis. 3. IUD in place. 4. Aortic atherosclerosis. Aortic Atherosclerosis (ICD10-I70.0). Electronically Signed   By: [name] M.D.   On: 02/15/2023 18:59    Anti-infectives: Anti-infectives (From admission, onward)    Start     Dose/Rate Route Frequency Ordered Stop   02/16/23 0600  piperacillin -tazobactam (ZOSYN ) IVPB 3.375 g        3.375 g 12.5 mL/hr over 240 Minutes Intravenous Every 8 hours 02/15/23 2010  02/15/23 1915  piperacillin -tazobactam (ZOSYN ) IVPB 3.375 g        3.375 g 100 mL/hr over 30 Minutes Intravenous   Once 02/15/23 1904 02/15/23 1957       Assessment/Plan: Patient with diverticulitis and microperforation improving.  Encouraged oral pain meds IV antibiotics Hopefully home in next 24 hours    LOS: 2 days    Isabella Walker 02/17/2023

## 2023-02-17 NOTE — Progress Notes (Signed)
 PROGRESS NOTE  Isabella Walker, is a 43 y.o. female, DOB - [birth year], ATF:[id number]  Admit date - 02/15/2023   Admitting Physician Twilla Galea, DO  Outpatient Primary MD for the patient is [facility]ll[facility], [facility]  LOS - 2  Chief Complaint  Patient presents with   Abdominal Pain      Brief Narrative:  43 y.o. female with medical history significant morbid obesity and GERD admitted on 02/15/2023 with acute  diverticulitis with perforation    -Assessment and Plan: 1) acute sigmoid difficulties with perforation---  WBC 11.6 >>10.1  -Continue IV Zosyn  -As needed IV and oral pain medications -Advance diet as tolerated -As needed antiemetics As per Gen Surgeon -Dr Collene Dawson hold dc until 02/18/23 at the earliest  2)GERD--Protonix  as ordered  3)Morbid Obesity- -Low calorie diet, portion control and increase physical activity discussed with patient -Body mass index is 41.9 kg/m.  4)Tobacco Abuse-- -Smoking cessation advised Use nicotine  patch  5)Elevated Lipase--- 78.... doubt frank pancreatitis -Continue management as above #1  Status is: Inpatient   Disposition: The patient is from: Home              Anticipated d/c is to: Home              Anticipated d/c date is: 2 days              Patient currently is not medically stable to d/c. Barriers: Not Clinically Stable-   Code Status :  -  Code Status: Full Code   Family Communication:    (patient is alert, awake and coherent)  Discussed with sister at bedside   DVT Prophylaxis  :   - SCDs   heparin  injection 5,000 Units Start: 02/15/23 2200 SCDs Start: 02/15/23 2010   Lab Results  Component Value Date   PLT 228 02/16/2023   Inpatient Medications  Scheduled Meds:  docusate sodium   100 mg Oral BID   heparin   5,000 Units Subcutaneous Q8H   nicotine   21 mg Transdermal Daily   pantoprazole   40 mg Oral Daily   senna-docusate  1 tablet Oral BID   Continuous Infusions:  piperacillin -tazobactam (ZOSYN )  IV 3.375 g  (02/17/23 0537)   PRN Meds:.acetaminophen  **OR** acetaminophen , morphine  injection, ondansetron  **OR** ondansetron  (ZOFRAN ) IV, oxyCODONE    Anti-infectives (From admission, onward)    Start     Dose/Rate Route Frequency Ordered Stop   02/16/23 0600  piperacillin -tazobactam (ZOSYN ) IVPB 3.375 g        3.375 g 12.5 mL/hr over 240 Minutes Intravenous Every 8 hours 02/15/23 2010     02/15/23 1915  piperacillin -tazobactam (ZOSYN ) IVPB 3.375 g        3.375 g 100 mL/hr over 30 Minutes Intravenous  Once 02/15/23 1904 02/15/23 1957      Subjective: Isabella Walker today has no fevers, no emesis,  No chest pain,   - Tolerating oral intake  +ve flatus  Abd pain is not worse  Objective: Vitals:   02/16/23 1534 02/16/23 1950 02/17/23 0452 02/17/23 1044  BP: (!) 105/33 (!) 119/51 (!) 107/34 113/75  Pulse: 73 79 72 73  Resp:  18  17  Temp:  98.1 F (36.7 C) 98.3 F (36.8 C) 98.2 F (36.8 C)  TempSrc:  Oral Oral Oral  SpO2:  98% 100% 98%  Weight:      Height:        Intake/Output Summary (Last 24 hours) at 02/17/2023 1048 Last data filed at 02/17/2023 0900 Gross per 24 hour  Intake 460 ml  Output --  Net 460 ml   Filed Weights   02/16/23 0506  Weight: 107.3 kg   Physical Exam Gen:- Awake Alert,  in no apparent distress  HEENT:- [place].AT, No sclera icterus Neck-Supple Neck,No JVD,.  Lungs-  CTAB , fair symmetrical air movement CV- S1, S2 normal, regular  Abd-  +ve B.Sounds, Abd Soft, Improving lower abdominal tenderness without significant rebound or guarding, increased truncal adiposity noted Extremity/Skin:- No  edema, pedal pulses present  Psych-affect is appropriate, oriented x3 Neuro-no new focal deficits, no tremors  Data Reviewed: I have personally reviewed following labs and imaging studies  CBC: Recent Labs  Lab 02/15/23 1355 02/16/23 0442  WBC 11.6* 10.1  HGB 15.3* 14.0  HCT 46.5* 43.4  MCV 103.6* 104.6*  PLT 272 228   Basic Metabolic Panel: Recent Labs   Lab 02/15/23 1355 02/16/23 0442  NA 135 135  K 4.3 3.8  CL 106 106  CO2 21* 19*  GLUCOSE 125* 113*  BUN 20 15  CREATININE 0.73 0.61  CALCIUM 9.1 8.7*  MG  --  1.9  PHOS  --  3.1   GFR: Estimated Creatinine Clearance: 105.4 mL/min (by C-G formula based on SCr of 0.61 mg/dL). Liver Function Tests: Recent Labs  Lab 02/15/23 1355 02/16/23 0442  AST 12* 11*  ALT 14 13  ALKPHOS 40 38  BILITOT 1.1 1.6*  PROT 7.5 6.8  ALBUMIN 4.0 3.5   Radiology Studies: CT ABDOMEN PELVIS W CONTRAST Result Date: 02/15/2023 CLINICAL DATA:  Left lower quadrant pain. EXAM: CT ABDOMEN AND PELVIS WITH CONTRAST TECHNIQUE: Multidetector CT imaging of the abdomen and pelvis was performed using the standard protocol following bolus administration of intravenous contrast. RADIATION DOSE REDUCTION: This exam was performed according to the departmental dose-optimization program which includes automated exposure control, adjustment of the mA and/or kV according to patient size and/or use of iterative reconstruction technique. CONTRAST:  OMNIPAQUE  IOHEXOL  300 MG/ML  SOLN COMPARISON:  September 25, 2021 FINDINGS: Lower chest: No acute abnormality. Hepatobiliary: No focal liver abnormality is seen. Multiple gallstones are seen within the lumen of an otherwise normal-appearing gallbladder. There is no evidence of biliary dilatation. Pancreas: Unremarkable. No pancreatic ductal dilatation or surrounding inflammatory changes. Spleen: Normal in size without focal abnormality. Adrenals/Urinary Tract: Adrenal glands are unremarkable. Kidneys are normal, without renal calculi, focal lesion, or hydronephrosis. Mild thickening of the anterior wall of the urinary bladder is seen, adjacent to an inflamed segment of sigmoid colon. Stomach/Bowel: Stomach is within normal limits. Appendix appears normal. No evidence of bowel dilatation markedly inflamed diverticula are seen within the proximal to mid sigmoid colon. A 4 mm focus of  extraluminal air is suspected within the region anterior to the inflamed diverticulum (axial CT image 80, CT series 2/sagittal reformatted image 83, CT series 5). Vascular/Lymphatic: Very mild aortic atherosclerosis. No enlarged abdominal or pelvic lymph nodes. Reproductive: An IUD is seen within an otherwise normal appearing uterus. The bilateral adnexa are unremarkable. Other: No abdominal wall hernia or abnormality. Surgical coils are seen within the anterior aspect of the pelvis. No abdominopelvic ascites. Musculoskeletal: No acute or significant osseous findings. IMPRESSION: 1. Marked severity sigmoid diverticulitis with a suspected 4 mm focus of extraluminal air within the region anterior to the inflamed diverticulum consistent with a localized perforation. 2. Cholelithiasis. 3. IUD in place. 4. Aortic atherosclerosis. Aortic Atherosclerosis (ICD10-I70.0). Electronically Signed   By: [name] M.D.   On: 02/15/2023 18:59   Scheduled Meds:  docusate sodium   100 mg Oral BID   heparin   5,000 Units Subcutaneous Q8H   nicotine   21 mg Transdermal Daily   pantoprazole   40 mg Oral Daily   senna-docusate  1 tablet Oral BID   Continuous Infusions:  piperacillin -tazobactam (ZOSYN )  IV 3.375 g (02/17/23 0537)    LOS: 2 days   Colin Dawley M.D on 02/17/2023 at 10:48 AM  Go to [email or web address] - for contact info  [facility] - Office  [phone]  If 7PM-7AM, please contact night-coverage [email or web address] 02/17/2023, 10:48 AM

## 2023-02-18 DIAGNOSIS — K5732 Diverticulitis of large intestine without perforation or abscess without bleeding: Secondary | ICD-10-CM | POA: Diagnosis not present

## 2023-02-18 LAB — BASIC METABOLIC PANEL
Anion gap: 6 (ref 5–15)
BUN: 16 mg/dL (ref 6–20)
CO2: 24 mmol/L (ref 22–32)
Calcium: 8.4 mg/dL — ABNORMAL LOW (ref 8.9–10.3)
Chloride: 106 mmol/L (ref 98–111)
Creatinine, Ser: 0.75 mg/dL (ref 0.44–1.00)
GFR, Estimated: >60 mL/min (ref >60)
Glucose, Bld: 74 mg/dL (ref 70–99)
Potassium: 4 mmol/L (ref 3.5–5.1)
Sodium: 136 mmol/L (ref 135–145)

## 2023-02-18 LAB — CBC
HCT: 38.8 % (ref 36.0–46.0)
Hemoglobin: 12.7 g/dL (ref 12.0–15.0)
MCH: 34.8 pg — ABNORMAL HIGH (ref 26.0–34.0)
MCHC: 32.7 g/dL (ref 30.0–36.0)
MCV: 106.3 fL — ABNORMAL HIGH (ref 80.0–100.0)
Platelets: 191 10*3/uL (ref 150–400)
RBC: 3.65 MIL/uL — ABNORMAL LOW (ref 3.87–5.11)
RDW: 13.1 % (ref 11.5–15.5)
WBC: 7 10*3/uL (ref 4.0–10.5)
nRBC: 0 % (ref 0.0–0.2)

## 2023-02-18 MED ORDER — AMOXICILLIN-POT CLAVULANATE 875-125 MG PO TABS
1.0000 | ORAL_TABLET | Freq: Two times a day (BID) | ORAL | Status: DC
  Administered 2023-02-18: 1 via ORAL
  Filled 2023-02-18: qty 1

## 2023-02-18 MED ORDER — SENNOSIDES-DOCUSATE SODIUM 8.6-50 MG PO TABS: 2.0000 | ORAL_TABLET | Freq: Every day | ORAL | 1 refills | Status: DC

## 2023-02-18 MED ORDER — ACETAMINOPHEN 325 MG PO TABS: 650.0000 mg | ORAL_TABLET | Freq: Four times a day (QID) | ORAL | Status: AC | PRN

## 2023-02-18 MED ORDER — PANTOPRAZOLE SODIUM 40 MG PO TBEC: 40.0000 mg | DELAYED_RELEASE_TABLET | Freq: Every day | ORAL | 1 refills | Status: DC

## 2023-02-18 MED ORDER — NICOTINE 21 MG/24HR TD PT24: 21.0000 mg | MEDICATED_PATCH | Freq: Every day | TRANSDERMAL | 0 refills | Status: DC

## 2023-02-18 MED ORDER — ONDANSETRON HCL 4 MG PO TABS: 4.0000 mg | ORAL_TABLET | Freq: Every day | ORAL | 0 refills | Status: DC | PRN

## 2023-02-18 MED ORDER — AMOXICILLIN-POT CLAVULANATE 875-125 MG PO TABS: 1.0000 | ORAL_TABLET | Freq: Two times a day (BID) | ORAL | 0 refills | Status: AC

## 2023-02-18 NOTE — Discharge Instructions (Signed)
1) please take medications including Augmentin (Amox-clav) antibiotic as prescribed 2) follow-up with general surgeon Dr. Henreitta Leber on 03/05/2023 for recheck and reevaluation

## 2023-02-18 NOTE — Progress Notes (Signed)
[facility]  Doing well and eating. Had Bms.   Can plan to see patient 2/26 in the office. Send home with Augmentin to complete course. Will need repeat follow up with GI. My office and send in the referral after we see her.   Algis Greenhouse, MD [facility] [address]ella Raring [place], [place] [address] [phone] (office)

## 2023-02-18 NOTE — Discharge Summary (Signed)
 Su R Butkus, is a 43 y.o. female  DOB November 11, 1980  MRN [id number].  Admission date:  02/15/2023  Admitting Physician  Frankey Shown, DO  Discharge Date:  02/18/2023   Primary MD  [facility]  Recommendations for primary care physician for things to follow:   1) please take medications including Augmentin (Amox-clav) antibiotic as prescribed 2) follow-up with general surgeon Dr. Henreitta Leber on 03/05/2023 for recheck and reevaluation  Admission Diagnosis  Diverticulitis [K57.92] Sigmoid diverticulitis [K57.32]   Discharge Diagnosis  Diverticulitis [K57.92] Sigmoid diverticulitis [K57.32]    Principal Problem:   Sigmoid diverticulitis Active Problems:   Tobacco abuse   Elevated lipase   Elevated MCV   Obesity, Class II, BMI 35-39.9      Past Medical History:  Diagnosis Date   Anxiety    Bipolar 1 disorder (HCC)    Complication of anesthesia    Depression complicating pregnancy, postpartum 2012   Diabetes mellitus without complication (HCC)    GERD (gastroesophageal reflux disease)    Headache(784.0)    hx of migraines-last one few months ago.   High cholesterol    Hypertension    IBS (irritable bowel syndrome)    Migraines    PTSD (post-traumatic stress disorder)    Thyroid disease     Past Surgical History:  Procedure Laterality Date   CARPAL TUNNEL RELEASE Left 08/20/2018   Procedure: CARPAL TUNNEL RELEASE;  Surgeon: Vickki Hearing, MD;  Location: [facility];  Service: Orthopedics;  Laterality: Left;   INSERTION OF MESH N/A 05/25/2012   Procedure: INSERTION OF MESH;  Surgeon: Dalia Heading, MD;  Location: [facility];  Service: General;  Laterality: N/A;   ORIF KNEE DISLOCATION Right    VENTRAL HERNIA REPAIR N/A 05/25/2012   Procedure: LAPAROSCOPIC VENTRAL HERNIA;  Surgeon: Dalia Heading, MD;  Location: [facility];  Service: General;  Laterality: N/A;     HPI  from the history  and physical done on the day of admission:    HPI: Yenifer R Milillo is a 43 y.o. female with medical history significant of diverticulitis who presents to the emergency department due to 1 day onset of left lower quadrant abdominal pain.  Pain was sharp, piercing, constant and was rated as 9/10 on pain scale.  It has since extended to right lower abdomen.  Pain was similar to prior episodes of diverticulitis.  She denies nausea, vomiting, diarrhea, fever, chills, blood in stool.   ED Course:  In the emergency department, BP was 126/58, other vital signs were within normal range.  Workup in the ED showed macrocytic anemia, WBC 11.6, platelets 272.  BMP was normal except for bicarb of 21 and blood glucose of 125, lipase 78, urinalysis was normal. CT abdomen and pelvis with contrast showed marked severity sigmoid diverticulitis with a suspected 4 mm focus of extraluminal air within the region anterior to the inflamed diverticulum consistent with a localized perforation. Patient was treated with IV fentanyl and morphine.  Zofran was given and patient was started on IV  Zosyn. General surgery (Dr. Henreitta Leber) was consulted and will see patient in the morning per ED PA.   Review of Systems: Review of systems as noted in the HPI. All other systems reviewed and are negative.     Hospital Course:     Brief Narrative:  44 y.o. female with medical history significant morbid obesity and GERD admitted on 02/15/2023 with acute  diverticulitis with perforation     -Assessment and Plan: 1) acute sigmoid difficulties with perforation---  WBC 11.6 >>10.1>>7.0 -Treated with IV Zosyn -Advance diet as tolerated -As needed antiemetics -Overall much improved, to discharge on Augmentin with outpatient follow-up with Gen Surgeon -Dr Henreitta Leber    2)GERD--Protonix as ordered   3)Morbid Obesity- -Low calorie diet, portion control and increase physical activity discussed with patient -Body mass index is 41.9 kg/m.    4)Tobacco Abuse-- -Smoking cessation advised Use nicotine patch   5)Elevated Lipase--- 78.... doubt frank pancreatitis -Continue management as above #1   Disposition: The patient is from: Home              Anticipated d/c is to: Home  Discharge Condition: stable  Follow UP   Follow-up Information     Lucretia Roers, MD. Go on 03/05/2023.   Specialty: General Surgery Contact information: [address] [place] [facility] [address] [phone]                 Consults obtained -General Surgery  Diet and Activity recommendation:  As advised  Discharge Instructions    Discharge Instructions     Call MD for:  difficulty breathing, headache or visual disturbances   Complete by: As directed    Call MD for:  persistant dizziness or light-headedness   Complete by: As directed    Call MD for:  persistant nausea and vomiting   Complete by: As directed    Call MD for:  temperature >100.4   Complete by: As directed    Diet - low sodium heart healthy   Complete by: As directed    Discharge instructions   Complete by: As directed    1) please take medications including Augmentin (Amox-clav) antibiotic as prescribed 2) follow-up with general surgeon Dr. Henreitta Leber on 03/05/2023 for recheck and reevaluation   Increase activity slowly   Complete by: As directed         Discharge Medications     Allergies as of 02/18/2023       Reactions   Bextra [valdecoxib] Other (See Comments)   SEVERE NOSE BLEEDS        Medication List     STOP taking these medications    GOODYS BODY PAIN PO       TAKE these medications    Accu-Chek Guide test strip Generic drug: glucose blood   acetaminophen 325 MG tablet Commonly known as: TYLENOL Take 2 tablets (650 mg total) by mouth every 6 (six) hours as needed for mild pain (pain score 1-3), fever or headache (or Fever >/= 101).   amoxicillin-clavulanate 875-125 MG tablet Commonly known as: AUGMENTIN Take 1 tablet by  mouth 2 (two) times daily for 7 days.   levonorgestrel 20 MCG/24HR IUD Commonly known as: MIRENA 1 each by Intrauterine route once.   nicotine 21 mg/24hr patch Commonly known as: NICODERM CQ - dosed in mg/24 hours Place 1 patch (21 mg total) onto the skin daily. Start taking on: February 19, 2023   ondansetron 4 MG tablet Commonly known as: Zofran Take 1 tablet (4 mg total)  by mouth daily as needed for nausea or vomiting.   pantoprazole 40 MG tablet Commonly known as: PROTONIX Take 1 tablet (40 mg total) by mouth daily. Start taking on: February 19, 2023   senna-docusate 8.6-50 MG tablet Commonly known as: Senokot-S Take 2 tablets by mouth at bedtime.        Major procedures and Radiology Reports - PLEASE review detailed and final reports for all details, in brief -   CT ABDOMEN PELVIS W CONTRAST Result Date: 02/15/2023 CLINICAL DATA:  Left lower quadrant pain. EXAM: CT ABDOMEN AND PELVIS WITH CONTRAST TECHNIQUE: Multidetector CT imaging of the abdomen and pelvis was performed using the standard protocol following bolus administration of intravenous contrast. RADIATION DOSE REDUCTION: This exam was performed according to the departmental dose-optimization program which includes automated exposure control, adjustment of the mA and/or kV according to patient size and/or use of iterative reconstruction technique. CONTRAST:  OMNIPAQUE IOHEXOL 300 MG/ML  SOLN COMPARISON:  September 25, 2021 FINDINGS: Lower chest: No acute abnormality. Hepatobiliary: No focal liver abnormality is seen. Multiple gallstones are seen within the lumen of an otherwise normal-appearing gallbladder. There is no evidence of biliary dilatation. Pancreas: Unremarkable. No pancreatic ductal dilatation or surrounding inflammatory changes. Spleen: Normal in size without focal abnormality. Adrenals/Urinary Tract: Adrenal glands are unremarkable. Kidneys are normal, without renal calculi, focal lesion, or  hydronephrosis. Mild thickening of the anterior wall of the urinary bladder is seen, adjacent to an inflamed segment of sigmoid colon. Stomach/Bowel: Stomach is within normal limits. Appendix appears normal. No evidence of bowel dilatation markedly inflamed diverticula are seen within the proximal to mid sigmoid colon. A 4 mm focus of extraluminal air is suspected within the region anterior to the inflamed diverticulum (axial CT image 80, CT series 2/sagittal reformatted image 83, CT series 5). Vascular/Lymphatic: Very mild aortic atherosclerosis. No enlarged abdominal or pelvic lymph nodes. Reproductive: An IUD is seen within an otherwise normal appearing uterus. The bilateral adnexa are unremarkable. Other: No abdominal wall hernia or abnormality. Surgical coils are seen within the anterior aspect of the pelvis. No abdominopelvic ascites. Musculoskeletal: No acute or significant osseous findings. IMPRESSION: 1. Marked severity sigmoid diverticulitis with a suspected 4 mm focus of extraluminal air within the region anterior to the inflamed diverticulum consistent with a localized perforation. 2. Cholelithiasis. 3. IUD in place. 4. Aortic atherosclerosis. Aortic Atherosclerosis (ICD10-I70.0). Electronically Signed   By: Aram Candela M.D.   On: 02/15/2023 18:59   Today   Subjective    Roshunda Traynham today has no new complaints No fever  Or chills   No Nausea, Vomiting or Diarrhea           Patient has been seen and examined prior to discharge   Objective   Blood pressure 126/67, pulse 68, temperature 98.1 F (36.7 C), temperature source Oral, resp. rate 18, height 5\' 3"  (1.6 m), weight 107.3 kg, SpO2 100%.   Intake/Output Summary (Last 24 hours) at 02/18/2023 1333 Last data filed at 02/18/2023 0800 Gross per 24 hour  Intake 980 ml  Output --  Net 980 ml    Exam Gen:- Awake Alert,  in no apparent distress  HEENT:- [place].AT, No sclera icterus Neck-Supple Neck,No JVD,.  Lungs-  CTAB ,  fair symmetrical air movement CV- S1, S2 normal, regular  Abd-  +ve B.Sounds, Abd Soft, mostly resolved lower abdominal tenderness without significant rebound or guarding, increased truncal adiposity noted Extremity/Skin:- No  edema, pedal pulses present  Psych-affect is appropriate, oriented x3  Neuro-no new focal deficits, no tremors    Data Review   CBC w Diff:  Lab Results  Component Value Date   WBC 7.0 02/18/2023   HGB 12.7 02/18/2023   HCT 38.8 02/18/2023   PLT 191 02/18/2023   LYMPHOPCT 32 08/25/2021   MONOPCT 5 08/25/2021   EOSPCT 2 08/25/2021   BASOPCT 0 08/25/2021    CMP:  Lab Results  Component Value Date   NA 136 02/18/2023   K 4.0 02/18/2023   CL 106 02/18/2023   CO2 24 02/18/2023   BUN 16 02/18/2023   CREATININE 0.75 02/18/2023   PROT 6.8 02/16/2023   ALBUMIN 3.5 02/16/2023   BILITOT 1.6 (H) 02/16/2023   ALKPHOS 38 02/16/2023   AST 11 (L) 02/16/2023   ALT 13 02/16/2023  .  Total Discharge time is about 33 minutes  Shon Hale M.D on 02/18/2023 at 1:33 PM  Go to [email or web address] -  for contact info  [facility] - Office  [phone]

## 2023-02-18 NOTE — Progress Notes (Signed)
Mobility Specialist Progress Note:    02/18/23 1050  Mobility  Activity Ambulated with assistance in hallway  Level of Assistance Modified independent, requires aide device or extra time  Assistive Device None  Distance Ambulated (ft) 200 ft  Range of Motion/Exercises Active;All extremities  Activity Response Tolerated well  Mobility Referral Yes  Mobility visit 1 Mobility  Mobility Specialist Start Time (ACUTE ONLY) 1050  Mobility Specialist Stop Time (ACUTE ONLY) 1110  Mobility Specialist Time Calculation (min) (ACUTE ONLY) 20 min   Pt received in bed, agreeable to mobility. Independently able to stand and ambulate with no AD. Tolerated well, c/o abdominal pain at EOS. Rated 7.5/10, returned pt sitting EOB. All needs met.  [name] Mobility Specialist Please contact via Special educational needs teacher or  Rehab office at [phone]

## 2023-03-05 ENCOUNTER — Ambulatory Visit: Payer: Medicaid Other | Admitting: General Surgery

## 2023-03-12 ENCOUNTER — Encounter: Payer: Self-pay | Admitting: General Surgery

## 2023-03-12 ENCOUNTER — Ambulatory Visit (INDEPENDENT_AMBULATORY_CARE_PROVIDER_SITE_OTHER): Payer: Medicaid Other | Admitting: General Surgery

## 2023-03-12 VITALS — BP 134/80 | HR 72 | Temp 97.9°F | Resp 14 | Ht 63.0 in | Wt 238.0 lb

## 2023-03-12 DIAGNOSIS — K5732 Diverticulitis of large intestine without perforation or abscess without bleeding: Secondary | ICD-10-CM

## 2023-03-12 NOTE — Patient Instructions (Signed)
Diverticulosis/ Diverticulitis Information and Diet:   Diverticulosis is a condition in which small, bulging pouches (diverticuli) form inside the lower part of the intestine, usually in the colon. Constipation and straining during bowel movements can worsen the condition. A diet rich in fiber can help keep stools soft and prevent inflammation.  Diverticulitis occurs when the pouches in the colon become infected or inflamed. Dietary changes can help the colon heal.  Fiber is an important part of the diet for patients with diverticulosis. A high-fiber diet softens and gives bulk to the stool, allowing it to pass quickly and easily.  Diet for Diverticulosis Eat a high-fiber diet when you have diverticulosis. Fiber softens the stool and helps prevent constipation. It also can help decrease pressure in the colon and help prevent flare-ups of diverticulitis.  High-fiber foods include:  Beans and legumes Bran, whole wheat bread and whole grain cereals such as oatmeal Brown and wild rice Fruits such as apples, bananas and pears Vegetables such as broccoli, carrots, corn and squash Whole wheat pasta If you currently don't have a diet high in fiber, you should add fiber gradually. This helps avoid bloating and abdominal discomfort. The target is to eat 25 to 30 grams of fiber daily. Drink at least 8 cups of fluid daily. Fluid will help soften your stool. Exercise also promotes bowel movement and helps prevent constipation.  When the colon is not inflamed, eat popcorn, nuts and seeds as tolerated.  Diet for Diverticulitis During flare ups of diverticulitis, follow a clear liquid diet. Your doctor will let you know when to progress from clear liquids to low fiber solids and then back to your normal diet.  A clear liquid diet means no solid foods. Juices should have no pulp. During the clear liquid diet, you may consume:  Broth Clear juices such as apple, cranberry and grape. (Avoid orange  juice) Jell-O Popsicles  When you're able to eat solid food, choose low fiber foods while healing. Low fiber foods include:  Canned or cooked fruit without seeds or skin, such as applesauce and melon Canned or well cooked vegetables without seeds and skin Dairy products such as cheese, milk and yogurt Eggs Low-fiber cereal Meat that is ground or tender and well cooked Pasta White bread and white rice AVOID RED MEAT WHILE YOU HAVE AN ACTIVE FLARE.  AVOID NSAIDS, IBUPROFEN, ALEVE, ASPIRIN WHILE YOU HAVE AN ACTIVE FLARE.  EXERCISE AS WELL AS SMOKING CESSATION CAN HELP PREVENT RECURRENCES.   After symptoms improve, usually within two to four days, you may add 5 to 15 grams of fiber a day back into your diet. Resume your high fiber diet when you no longer have symptoms.  

## 2023-03-12 NOTE — Progress Notes (Signed)
 [facility]  Doing well. Having very intermittent pains in the left side that are improving. Feels better overall. Having regular Bms.  BP 134/80   Pulse 72   Temp 97.9 F (36.6 C) (Oral)   Resp 14   Ht 5\' 3"  (1.6 m)   Wt 238 lb (108 kg)   SpO2 95%   BMI 42.16 kg/m  Soft nondistended tender LLQ   Patient with recent diverticulitis with microperforation. Feeling better.  Discussed diverticulitis and what to eat when not in a flare. Her mom and grandmother both have had colostomy due to diverticulitis.   She wants to avoid a colostomy. We discussed she may end up and need elective surgery. She definitely needs a colonoscopy.  Refer to GI and will see back in a few weeks.  Future Appointments  Date Time Provider Department Center  04/10/2023  2:45 PM Lucretia Roers, MD [facility] None   Algis Greenhouse, MD [facility] [address], [place] [address] [phone] (office)

## 2023-03-19 ENCOUNTER — Encounter: Payer: Self-pay | Admitting: *Deleted

## 2023-03-25 NOTE — Progress Notes (Unsigned)
 GI Office Note    Referring Provider: Lucretia Roers, MD Primary Care Physician:  Marylynn Pearson, FNP  Primary Gastroenterologist:  Chief Complaint   No chief complaint on file.   History of Present Illness   Isabella Walker is a 43 y.o. female presenting today   Last seen October 2023.  History of diverticulitis, IBS with constipation, substantial intentional weight loss***, GERD.  Patient with recent hospitalization for sigmoid diverticulitis with microperforation.  CT as outlined below.  Treated with IV antibiotic, transition to home on oral antibiotics.  She followed up with Dr. Henreitta Leber March 5.  Patient is wanting to avoid colostomy.  May require elective surgery at some point.  Patient's mother and grandmother both had colostomy due to diverticulitis.  Dr. Henreitta Leber recommending updated colonoscopy.  CT abdomen pelvis with contrast February 2025: Moderate to severity sigmoid diverticulitis with a suspected 4 mm focus of extraluminal air within the region anterior to the inflamed diverticulum consistent with localized perforation.  IUD in place.  Cholelithiasis.  Appendix normal.    Medications   Current Outpatient Medications  Medication Sig Dispense Refill   ACCU-CHEK GUIDE test strip      acetaminophen (TYLENOL) 325 MG tablet Take 2 tablets (650 mg total) by mouth every 6 (six) hours as needed for mild pain (pain score 1-3), fever or headache (or Fever >/= 101).     levonorgestrel (MIRENA) 20 MCG/24HR IUD 1 each by Intrauterine route once.      nicotine (NICODERM CQ - DOSED IN MG/24 HOURS) 21 mg/24hr patch Place 1 patch (21 mg total) onto the skin daily. (Patient not taking: Reported on 03/12/2023) 28 patch 0   ondansetron (ZOFRAN) 4 MG tablet Take 1 tablet (4 mg total) by mouth daily as needed for nausea or vomiting. 15 tablet 0   pantoprazole (PROTONIX) 40 MG tablet Take 1 tablet (40 mg total) by mouth daily. 30 tablet 1   senna-docusate (SENOKOT-S) 8.6-50 MG  tablet Take 2 tablets by mouth at bedtime. 60 tablet 1   No current facility-administered medications for this visit.    Allergies   Allergies as of 03/26/2023 - Review Complete 03/12/2023  Allergen Reaction Noted   Bextra [valdecoxib] Other (See Comments) 05/01/2012     Past Medical History   Past Medical History:  Diagnosis Date   Anxiety    Bipolar 1 disorder (HCC)    Complication of anesthesia    Depression complicating pregnancy, postpartum 2012   Diabetes mellitus without complication (HCC)    GERD (gastroesophageal reflux disease)    Headache(784.0)    hx of migraines-last one few months ago.   High cholesterol    Hypertension    IBS (irritable bowel syndrome)    Migraines    PTSD (post-traumatic stress disorder)    Thyroid disease     Past Surgical History   Past Surgical History:  Procedure Laterality Date   CARPAL TUNNEL RELEASE Left 08/20/2018   Procedure: CARPAL TUNNEL RELEASE;  Surgeon: Vickki Hearing, MD;  Location: [facility];  Service: Orthopedics;  Laterality: Left;   INSERTION OF MESH N/A 05/25/2012   Procedure: INSERTION OF MESH;  Surgeon: Dalia Heading, MD;  Location: [facility];  Service: General;  Laterality: N/A;   ORIF KNEE DISLOCATION Right    VENTRAL HERNIA REPAIR N/A 05/25/2012   Procedure: LAPAROSCOPIC VENTRAL HERNIA;  Surgeon: Dalia Heading, MD;  Location: [facility];  Service: General;  Laterality: N/A;    Past Family History  Family History  Problem Relation Age of Onset   High blood pressure Mother    Diverticulitis Mother        had colostomy   Diabetes Father    High blood pressure Father    Diverticulitis Maternal Grandmother        had to have colon surgery   Cancer Paternal Aunt        ovarian   Colon cancer Neg Hx     Past Social History   Social History   Socioeconomic History   Marital status: Married    Spouse name: Not on file   Number of children: Not on file   Years of education: Not on file   Highest education  level: Not on file  Occupational History   Not on file  Tobacco Use   Smoking status: Every Day    Current packs/day: 0.00    Average packs/day: 1 pack/day for 15.0 years (15.0 ttl pk-yrs)    Types: Cigarettes    Start date: 01/31/2002    Last attempt to quit: 01/31/2017    Years since quitting: 6.1   Smokeless tobacco: Never  Vaping Use   Vaping status: Former  Substance and Sexual Activity   Alcohol use: Never   Drug use: Yes    Types: Marijuana   Sexual activity: Yes    Birth control/protection: I.U.D.  Other Topics Concern   Not on file  Social History Narrative   ** Merged History Encounter **       Social Drivers of Health   Financial Resource Strain: Not on file  Food Insecurity: Food Insecurity Present (02/15/2023)   Hunger Vital Sign    Worried About Running Out of Food in the Last Year: Sometimes true    Ran Out of Food in the Last Year: Sometimes true  Transportation Needs: No Transportation Needs (02/15/2023)   PRAPARE - Administrator, Civil Service (Medical): No    Lack of Transportation (Non-Medical): No  Physical Activity: Not on file  Stress: Not on file  Social Connections: Not on file  Intimate Partner Violence: Not At Risk (02/15/2023)   Humiliation, Afraid, Rape, and Kick questionnaire    Fear of Current or Ex-Partner: No    Emotionally Abused: No    Physically Abused: No    Sexually Abused: No    Review of Systems   General: Negative for anorexia, weight loss, fever, chills, fatigue, weakness. ENT: Negative for hoarseness, difficulty swallowing , nasal congestion. CV: Negative for chest pain, angina, palpitations, dyspnea on exertion, peripheral edema.  Respiratory: Negative for dyspnea at rest, dyspnea on exertion, cough, sputum, wheezing.  GI: See history of present illness. GU:  Negative for dysuria, hematuria, urinary incontinence, urinary frequency, nocturnal urination.  Endo: Negative for unusual weight change.     Physical Exam    There were no vitals taken for this visit.   General: Well-nourished, well-developed in no acute distress.  Eyes: No icterus. Mouth: Oropharyngeal mucosa moist and pink , no lesions erythema or exudate. Lungs: Clear to auscultation bilaterally.  Heart: Regular rate and rhythm, no murmurs rubs or gallops.  Abdomen: Bowel sounds are normal, nontender, nondistended, no hepatosplenomegaly or masses,  no abdominal bruits or hernia , no rebound or guarding.  Rectal: ***  Extremities: No lower extremity edema. No clubbing or deformities. Neuro: Alert and oriented x 4   Skin: Warm and dry, no jaundice.   Psych: Alert and cooperative, normal mood and affect.  Labs  Lab Results  Component Value Date   NA 136 02/18/2023   CL 106 02/18/2023   K 4.0 02/18/2023   CO2 24 02/18/2023   BUN 16 02/18/2023   CREATININE 0.75 02/18/2023   GFRNONAA >60 02/18/2023   CALCIUM 8.4 (L) 02/18/2023   PHOS 3.1 02/16/2023   ALBUMIN 3.5 02/16/2023   GLUCOSE 74 02/18/2023   Lab Results  Component Value Date   ALT 13 02/16/2023   AST 11 (L) 02/16/2023   ALKPHOS 38 02/16/2023   BILITOT 1.6 (H) 02/16/2023   Lab Results  Component Value Date   WBC 7.0 02/18/2023   HGB 12.7 02/18/2023   HCT 38.8 02/18/2023   MCV 106.3 (H) 02/18/2023   PLT 191 02/18/2023   Lab Results  Component Value Date   VITAMINB12 240 02/16/2023   Lab Results  Component Value Date   FOLATE 19.6 02/16/2023   No results found for: "IRON", "TIBC", "FERRITIN"  Imaging Studies   No results found.  Assessment       PLAN   ***   Leanna Battles. Melvyn Neth, MHS, PA-C [facility]

## 2023-03-26 ENCOUNTER — Ambulatory Visit (INDEPENDENT_AMBULATORY_CARE_PROVIDER_SITE_OTHER): Admitting: Gastroenterology

## 2023-03-26 ENCOUNTER — Encounter: Payer: Self-pay | Admitting: Gastroenterology

## 2023-03-26 ENCOUNTER — Telehealth: Payer: Self-pay | Admitting: Gastroenterology

## 2023-03-26 VITALS — BP 119/77 | HR 105 | Temp 98.2°F | Ht 63.0 in | Wt 237.0 lb

## 2023-03-26 DIAGNOSIS — K5732 Diverticulitis of large intestine without perforation or abscess without bleeding: Secondary | ICD-10-CM

## 2023-03-26 DIAGNOSIS — K219 Gastro-esophageal reflux disease without esophagitis: Secondary | ICD-10-CM

## 2023-03-26 DIAGNOSIS — R1013 Epigastric pain: Secondary | ICD-10-CM

## 2023-03-26 DIAGNOSIS — R933 Abnormal findings on diagnostic imaging of other parts of digestive tract: Secondary | ICD-10-CM | POA: Insufficient documentation

## 2023-03-26 DIAGNOSIS — R6881 Early satiety: Secondary | ICD-10-CM

## 2023-03-26 MED ORDER — SULFAMETHOXAZOLE-TRIMETHOPRIM 800-160 MG PO TABS: 1.0000 | ORAL_TABLET | Freq: Two times a day (BID) | ORAL | 0 refills | Status: AC

## 2023-03-26 NOTE — Telephone Encounter (Signed)
 Patient seen in office. Spoke electronically with Dr. Henreitta Leber. Ok to proceed with colonoscopy sometime in [place] (6-9 weeks after episode of diverticulitis)  Please schedule TCS/EGD with Marletta Lor. ASA 3. Room 1/2 ok. Dx: complicated diverticulitis, abnormal CT, chronic GERD, early satiety, epig pain

## 2023-03-26 NOTE — Patient Instructions (Signed)
 Start Bactrim 1 tablet twice daily for 10 days. Hold off on starting metformin until you have finished Bactrim.  Monitor for worsening pain, swelling, drainage from your leg. If you notices worsening symptoms, go to the ER or urgent care.   We will call and schedule colonoscopy and upper endoscopy once I get in touch with Dr. Henreitta Leber.

## 2023-03-27 MED ORDER — PEG 3350-KCL-NA BICARB-NACL 420 G PO SOLR: 4000.0000 mL | Freq: Once | ORAL | 0 refills | Status: AC

## 2023-03-27 NOTE — Addendum Note (Signed)
 Addended by: Armstead Peaks on: 03/27/2023 09:12 AM   Modules accepted: Orders

## 2023-03-27 NOTE — Telephone Encounter (Signed)
 Spoke with pt and is aware. Agreeable to procedure. She has been scheduled for 4/22 at 9am. Aware will send instructions via mychart. Rx for prep will be sent to pharmacy.   PA approved via [facility] [id number] DOB: Apr 29, 2023 - Jul 28, 2023

## 2023-04-08 ENCOUNTER — Ambulatory Visit: Admitting: General Surgery

## 2023-04-10 ENCOUNTER — Ambulatory Visit: Admitting: General Surgery

## 2023-04-29 ENCOUNTER — Ambulatory Visit (HOSPITAL_COMMUNITY)
Admission: RE | Admit: 2023-04-29 | Discharge: 2023-04-29 | Disposition: A | Discharge status: Home / Self Care | Attending: Internal Medicine | Admitting: Internal Medicine

## 2023-04-29 ENCOUNTER — Encounter (HOSPITAL_COMMUNITY)
Admission: RE | Disposition: A | Discharge status: Home / Self Care | Payer: Self-pay | Source: Home / Self Care | Attending: Internal Medicine

## 2023-04-29 ENCOUNTER — Ambulatory Visit (HOSPITAL_COMMUNITY): Payer: Self-pay | Admitting: Certified Registered Nurse Anesthetist

## 2023-04-29 ENCOUNTER — Encounter (HOSPITAL_COMMUNITY): Payer: Self-pay | Admitting: Internal Medicine

## 2023-04-29 ENCOUNTER — Other Ambulatory Visit: Payer: Self-pay

## 2023-04-29 ENCOUNTER — Ambulatory Visit (HOSPITAL_BASED_OUTPATIENT_CLINIC_OR_DEPARTMENT_OTHER): Payer: Self-pay | Admitting: Certified Registered Nurse Anesthetist

## 2023-04-29 DIAGNOSIS — K2970 Gastritis, unspecified, without bleeding: Secondary | ICD-10-CM | POA: Insufficient documentation

## 2023-04-29 DIAGNOSIS — K589 Irritable bowel syndrome without diarrhea: Secondary | ICD-10-CM | POA: Insufficient documentation

## 2023-04-29 DIAGNOSIS — D124 Benign neoplasm of descending colon: Secondary | ICD-10-CM

## 2023-04-29 DIAGNOSIS — I10 Essential (primary) hypertension: Secondary | ICD-10-CM | POA: Diagnosis not present

## 2023-04-29 DIAGNOSIS — K5730 Diverticulosis of large intestine without perforation or abscess without bleeding: Secondary | ICD-10-CM | POA: Diagnosis present

## 2023-04-29 DIAGNOSIS — K648 Other hemorrhoids: Secondary | ICD-10-CM | POA: Insufficient documentation

## 2023-04-29 DIAGNOSIS — K2270 Barrett's esophagus without dysplasia: Secondary | ICD-10-CM | POA: Insufficient documentation

## 2023-04-29 DIAGNOSIS — R1310 Dysphagia, unspecified: Secondary | ICD-10-CM | POA: Insufficient documentation

## 2023-04-29 DIAGNOSIS — R6881 Early satiety: Secondary | ICD-10-CM | POA: Diagnosis not present

## 2023-04-29 DIAGNOSIS — F17200 Nicotine dependence, unspecified, uncomplicated: Secondary | ICD-10-CM | POA: Insufficient documentation

## 2023-04-29 DIAGNOSIS — K219 Gastro-esophageal reflux disease without esophagitis: Secondary | ICD-10-CM | POA: Insufficient documentation

## 2023-04-29 DIAGNOSIS — D125 Benign neoplasm of sigmoid colon: Secondary | ICD-10-CM | POA: Diagnosis not present

## 2023-04-29 DIAGNOSIS — D123 Benign neoplasm of transverse colon: Secondary | ICD-10-CM | POA: Diagnosis not present

## 2023-04-29 DIAGNOSIS — K2289 Other specified disease of esophagus: Secondary | ICD-10-CM | POA: Diagnosis not present

## 2023-04-29 DIAGNOSIS — K635 Polyp of colon: Secondary | ICD-10-CM

## 2023-04-29 DIAGNOSIS — K649 Unspecified hemorrhoids: Secondary | ICD-10-CM | POA: Diagnosis not present

## 2023-04-29 DIAGNOSIS — E119 Type 2 diabetes mellitus without complications: Secondary | ICD-10-CM | POA: Insufficient documentation

## 2023-04-29 HISTORY — PX: COLONOSCOPY: SHX5424

## 2023-04-29 HISTORY — PX: ESOPHAGOGASTRODUODENOSCOPY: SHX5428

## 2023-04-29 LAB — GLUCOSE, CAPILLARY: Glucose-Capillary: 97 mg/dL (ref 70–99)

## 2023-04-29 LAB — POCT PREGNANCY, URINE: Preg Test, Ur: NEGATIVE

## 2023-04-29 SURGERY — COLONOSCOPY
Anesthesia: General

## 2023-04-29 MED ORDER — PROPOFOL 500 MG/50ML IV EMUL
INTRAVENOUS | Status: DC | PRN
  Administered 2023-04-29: 150 ug/kg/min via INTRAVENOUS
  Administered 2023-04-29: 30 mg via INTRAVENOUS
  Administered 2023-04-29: 100 mg via INTRAVENOUS
  Administered 2023-04-29: 30 mg via INTRAVENOUS

## 2023-04-29 MED ORDER — DEXMEDETOMIDINE HCL IN NACL 80 MCG/20ML IV SOLN
INTRAVENOUS | Status: DC | PRN
  Administered 2023-04-29 (×2): 6 ug via INTRAVENOUS

## 2023-04-29 MED ORDER — LACTATED RINGERS IV SOLN: INTRAVENOUS | Status: DC

## 2023-04-29 MED ORDER — PANTOPRAZOLE SODIUM 40 MG PO TBEC: 40.0000 mg | DELAYED_RELEASE_TABLET | Freq: Two times a day (BID) | ORAL | 11 refills | Status: DC

## 2023-04-29 MED ORDER — LACTATED RINGERS IV SOLN
INTRAVENOUS | Status: DC | PRN
Start: 2023-04-29 — End: 2023-04-29

## 2023-04-29 MED ORDER — LIDOCAINE HCL (CARDIAC) PF 100 MG/5ML IV SOSY
PREFILLED_SYRINGE | INTRAVENOUS | Status: DC | PRN
  Administered 2023-04-29: 100 mg via INTRAVENOUS

## 2023-04-29 NOTE — Op Note (Signed)
 [facility] Patient Name: Isabella Walker Procedure Date: 04/29/2023 8:05 AM MRN: [id number] Date of Birth: Oct 03, 1980 Attending MD: Rolando Cliche. Mordechai Laverna , [place], [phone] CSN: [id number] Age: 43 Admit Type: Outpatient Procedure:                Upper GI endoscopy Indications:              Epigastric abdominal pain, Dysphagia, Heartburn,                            Early satiety Providers:                Rolando Cliche. Mordechai Nyjah, DO, Willena Harp, [place]                            Wilson, Technician, Theola Fitch Referring MD:              Medicines:                See the Anesthesia note for documentation of the                            administered medications Complications:            No immediate complications. Estimated Blood Loss:     Estimated blood loss was minimal. Procedure:                Pre-Anesthesia Assessment:                           - The anesthesia plan was to use monitored                            anesthesia care (MAC).                           After obtaining informed consent, the endoscope was                            passed under direct vision. Throughout the                            procedure, the patient's blood pressure, pulse, and                            oxygen saturations were monitored continuously. The                            [id number]-H190 ([id number]) scope was introduced through the                            mouth, and advanced to the second part of duodenum.                            The upper GI endoscopy was accomplished without                            difficulty. The patient tolerated the  procedure                            well. Scope In: 9:06:27 AM Scope Out: 9:10:08 AM Total Procedure Duration: 0 hours 3 minutes 41 seconds  Findings:      The esophagus and gastroesophageal junction were examined with white       light and narrow band imaging (NBI) from a forward view and retroflexed       position. There were esophageal mucosal  changes consistent with       short-segment Barrett's esophagus. These changes involved the mucosa       extending to the Z-line. One tongue of salmon-colored mucosa was       present. The maximum longitudinal extent of these esophageal mucosal       changes was 1 cm in length. Mucosa was biopsied with a cold forceps for       histology. One specimen bottle was sent to pathology.      Patchy mild inflammation characterized by erythema was found in the       gastric body and in the gastric antrum. Biopsies were taken with a cold       forceps for Helicobacter pylori testing.      The duodenal bulb, first portion of the duodenum and second portion of       the duodenum were normal. Impression:               - Esophageal mucosal changes consistent with                            short-segment Barrett's esophagus. Biopsied.                           - Gastritis. Biopsied.                           - Normal duodenal bulb, first portion of the                            duodenum and second portion of the duodenum. Moderate Sedation:      Per Anesthesia Care Recommendation:           - Patient has a contact number available for                            emergencies. The signs and symptoms of potential                            delayed complications were discussed with the                            patient. Return to normal activities tomorrow.                            Written discharge instructions were provided to the                            patient.                           -  Resume previous diet.                           - Continue present medications.                           - Await pathology results.                           - Use a proton pump inhibitor PO BID.                           - Return to GI clinic in 8 weeks. Procedure Code(s):        --- Professional ---                           [phone], Esophagogastroduodenoscopy, flexible,                            transoral;  with biopsy, single or multiple Diagnosis Code(s):        --- Professional ---                           K22.89, Other specified disease of esophagus                           K29.70, Gastritis, unspecified, without bleeding                           R10.13, Epigastric pain                           R13.10, Dysphagia, unspecified                           R12, Heartburn                           R68.81, Early satiety CPT copyright 2022 American Medical Association. All rights reserved. The codes documented in this report are preliminary and upon coder review may  be revised to meet current compliance requirements. Rolando Cliche. Mordechai Shanti, DO Rolando Cliche. Mordechai Yazlin, DO 04/29/2023 9:15:10 AM This report has been signed electronically. Number of Addenda: 0

## 2023-04-29 NOTE — H&P (Signed)
 Primary Care Physician:  Jace Martinet, FNP Primary Gastroenterologist:  Dr. Mordechai Shiri  Pre-Procedure History & Physical: HPI:  Isabella Walker is a 43 y.o. female is here for an upper endoscopy for Chronic GERD/early satiety/epigastric pain, and colonoscopy for history of complicated diverticulitis.  Past Medical History:  Diagnosis Date   Anxiety    Bipolar 1 disorder (HCC)    Complication of anesthesia    Depression complicating pregnancy, postpartum 2012   Diabetes mellitus without complication (HCC)    GERD (gastroesophageal reflux disease)    Headache(784.0)    hx of migraines-last one few months ago.   High cholesterol    Hypertension    IBS (irritable bowel syndrome)    Migraines    PTSD (post-traumatic stress disorder)    Thyroid disease     Past Surgical History:  Procedure Laterality Date   CARPAL TUNNEL RELEASE Left 08/20/2018   Procedure: CARPAL TUNNEL RELEASE;  Surgeon: Darrin Emerald, MD;  Location: [facility];  Service: Orthopedics;  Laterality: Left;   INSERTION OF MESH N/A 05/25/2012   Procedure: INSERTION OF MESH;  Surgeon: Beau Bound, MD;  Location: [facility];  Service: General;  Laterality: N/A;   ORIF KNEE DISLOCATION Right    VENTRAL HERNIA REPAIR N/A 05/25/2012   Procedure: LAPAROSCOPIC VENTRAL HERNIA;  Surgeon: Beau Bound, MD;  Location: [facility];  Service: General;  Laterality: N/A;    Prior to Admission medications   Medication Sig Start Date End Date Taking? Authorizing Provider  pantoprazole  (PROTONIX ) 40 MG tablet Take 1 tablet (40 mg total) by mouth daily. 02/19/23  Yes Emokpae, Courage, MD  simvastatin (ZOCOR) 10 MG tablet Take 10 mg by mouth daily.   Yes [provider]  ACCU-CHEK GUIDE test strip  08/15/21   [provider]  acetaminophen  (TYLENOL ) 325 MG tablet Take 2 tablets (650 mg total) by mouth every 6 (six) hours as needed for mild pain (pain score 1-3), fever or headache (or Fever >/= 101). 02/18/23   Colin Dawley,  MD  levonorgestrel (MIRENA) 20 MCG/24HR IUD 1 each by Intrauterine route once.     [provider]  METFORMIN HCL PO Take by mouth.    [provider]  ondansetron  (ZOFRAN ) 4 MG tablet Take 1 tablet (4 mg total) by mouth daily as needed for nausea or vomiting. 02/18/23 02/18/24  Colin Dawley, MD  senna-docusate (SENOKOT-S) 8.6-50 MG tablet Take 2 tablets by mouth at bedtime. Patient taking differently: Take 1 tablet by mouth every other day. 02/18/23   Colin Dawley, MD    Allergies as of 03/27/2023 - Review Complete 03/26/2023  Allergen Reaction Noted   Bextra [valdecoxib] Other (See Comments) 05/01/2012    Family History  Problem Relation Age of Onset   High blood pressure Mother    Diverticulitis Mother        had colostomy   Diabetes Father    High blood pressure Father    Diverticulitis Maternal Grandmother        had to have colon surgery   Cancer Paternal Aunt        ovarian   Colon cancer Neg Hx     Social History   Socioeconomic History   Marital status: Married    Spouse name: Not on file   Number of children: Not on file   Years of education: Not on file   Highest education level: Not on file  Occupational History   Not on file  Tobacco Use  Smoking status: Every Day    Current packs/day: 0.00    Average packs/day: 1 pack/day for 15.0 years (15.0 ttl pk-yrs)    Types: Cigarettes    Start date: 01/31/2002    Last attempt to quit: 01/31/2017    Years since quitting: 6.2   Smokeless tobacco: Never  Vaping Use   Vaping status: Former  Substance and Sexual Activity   Alcohol use: Never   Drug use: Yes    Types: Marijuana   Sexual activity: Yes    Birth control/protection: I.U.D.  Other Topics Concern   Not on file  Social History Narrative   ** Merged History Encounter **       Social Drivers of Health   Financial Resource Strain: Not on file  Food Insecurity: Food Insecurity Present (02/15/2023)   Hunger Vital Sign    Worried  About Running Out of Food in the Last Year: Sometimes true    Ran Out of Food in the Last Year: Sometimes true  Transportation Needs: No Transportation Needs (02/15/2023)   PRAPARE - Administrator, Civil Service (Medical): No    Lack of Transportation (Non-Medical): No  Physical Activity: Not on file  Stress: Not on file  Social Connections: Not on file  Intimate Partner Violence: Not At Risk (02/15/2023)   Humiliation, Afraid, Rape, and Kick questionnaire    Fear of Current or Ex-Partner: No    Emotionally Abused: No    Physically Abused: No    Sexually Abused: No    Review of Systems: See HPI, otherwise negative ROS  Physical Exam: Vital signs in last 24 hours: Temp:  [98.3 F (36.8 C)] 98.3 F (36.8 C) (04/22 0757) Pulse Rate:  [83] 83 (04/22 0757) Resp:  [20] 20 (04/22 0757) BP: (133)/(74) 133/74 (04/22 0757) SpO2:  [100 %] 100 % (04/22 0757) Weight:  [100.7 kg] 100.7 kg (04/22 0757)   General:   Alert,  Well-developed, well-nourished, pleasant and cooperative in NAD Head:  Normocephalic and atraumatic. Eyes:  Sclera clear, no icterus.   Conjunctiva pink. Ears:  Normal auditory acuity. Nose:  No deformity, discharge,  or lesions. Msk:  Symmetrical without gross deformities. Normal posture. Extremities:  Without clubbing or edema. Neurologic:  Alert and  oriented x4;  grossly normal neurologically. Skin:  Intact without significant lesions or rashes. Psych:  Alert and cooperative. Normal mood and affect.  Impression/Plan: Isabella Walker is here for an upper endoscopy for Chronic GERD/early satiety/epigastric pain, and colonoscopy for history of complicated diverticulitis.  The risks of the procedure including infection, bleed, or perforation as well as benefits, limitations, alternatives and imponderables have been reviewed with the patient. Questions have been answered. All parties agreeable.

## 2023-04-29 NOTE — Op Note (Signed)
 [facility] Patient Name: Isabella Walker Procedure Date: 04/29/2023 8:02 AM MRN: [id number] Date of Birth: 1980/12/13 Attending MD: Rolando Cliche. Mordechai Fable , [place], [id number] CSN: [id number] Age: 43 Admit Type: Outpatient Procedure:                Colonoscopy Indications:              Follow-up of diverticulitis Providers:                Rolando Cliche. Mordechai Shaquan, DO, Willena Harp, [place]                            Wilson, Technician, Theola Fitch Referring MD:              Medicines:                See the Anesthesia note for documentation of the                            administered medications Complications:            No immediate complications. Estimated Blood Loss:     Estimated blood loss was minimal. Procedure:                Pre-Anesthesia Assessment:                           - The anesthesia plan was to use monitored                            anesthesia care (MAC).                           After obtaining informed consent, the colonoscope                            was passed under direct vision. Throughout the                            procedure, the patient's blood pressure, pulse, and                            oxygen saturations were monitored continuously. The                            PCF-HQ190L ([id number]) scope was introduced through                            the anus and advanced to the the cecum, identified                            by appendiceal orifice and ileocecal valve. The                            colonoscopy was technically difficult and complex                            due to abnormal anatomy. The patient  tolerated the                            procedure well. The quality of the bowel                            preparation was evaluated using the BBPS [facility]                            Bowel Preparation Scale) with scores of: Right                            Colon = 2 (minor amount of residual staining, small                            fragments of  stool and/or opaque liquid, but mucosa                            seen well), Transverse Colon = 2 (minor amount of                            residual staining, small fragments of stool and/or                            opaque liquid, but mucosa seen well) and Left Colon                            = 2 (minor amount of residual staining, small                            fragments of stool and/or opaque liquid, but mucosa                            seen well). The total BBPS score equals 6. The                            quality of the bowel preparation was good. Scope In: 9:16:12 AM Scope Out: 9:54:27 AM Scope Withdrawal Time: 0 hours 25 minutes 43 seconds  Total Procedure Duration: 0 hours 38 minutes 15 seconds  Findings:      Non-bleeding internal hemorrhoids were found during retroflexion. The       hemorrhoids were moderate.      Many large-mouthed and small-mouthed diverticula were found in the       sigmoid colon. Mild inflamamtion apparent. Angulated colon, somewhat       difficult to maneuver. There was no evidence of diverticular bleeding.       Biopsies were taken with a cold forceps for histology.      A few small-mouthed diverticula were found in the descending colon.      Six sessile polyps were found in the descending colon and transverse       colon. The polyps were 5 to 10 mm in size. These polyps were removed       with a cold snare. Resection and retrieval were complete.  A 5 mm polyp was found in the sigmoid colon. The polyp was pedunculated.       The polyp was removed with a cold snare. Resection and retrieval were       complete. Impression:               - Non-bleeding internal hemorrhoids.                           - Diverticulosis in the sigmoid colon. There was no                            evidence of diverticular bleeding. Biopsied.                           - Diverticulosis in the descending colon.                           - Six 5 to 10 mm polyps in the  descending colon and                            in the transverse colon, removed with a cold snare.                            Resected and retrieved.                           - One 5 mm polyp in the sigmoid colon, removed with                            a cold snare. Resected and retrieved. Moderate Sedation:      Per Anesthesia Care Recommendation:           - Patient has a contact number available for                            emergencies. The signs and symptoms of potential                            delayed complications were discussed with the                            patient. Return to normal activities tomorrow.                            Written discharge instructions were provided to the                            patient.                           - Resume previous diet.                           - Continue present medications.                           -  Await pathology results.                           - Repeat colonoscopy in 3 years for surveillance.                           - Return to GI clinic in 8 weeks.                           - Consider elective sigmoidectomy. Will discuss                            findings with Dr. Collene Dawson Procedure Code(s):        --- Professional ---                           [phone], Colonoscopy, flexible; with removal of                            tumor(s), polyp(s), or other lesion(s) by snare                            technique                           45380, 59, Colonoscopy, flexible; with biopsy,                            single or multiple Diagnosis Code(s):        --- Professional ---                           K64.8, Other hemorrhoids                           D12.4, Benign neoplasm of descending colon                           D12.3, Benign neoplasm of transverse colon (hepatic                            flexure or splenic flexure)                           D12.5, Benign neoplasm of sigmoid colon                            K57.32, Diverticulitis of large intestine without                            perforation or abscess without bleeding                           K57.30, Diverticulosis of large intestine without                            perforation or abscess without bleeding  CPT copyright 2022 American Medical Association. All rights reserved. The codes documented in this report are preliminary and upon coder review may  be revised to meet current compliance requirements. Rolando Cliche. Mordechai Iyanni, DO Rolando Cliche. Mordechai Tykeisha, DO 04/29/2023 10:02:20 AM This report has been signed electronically. Number of Addenda: 0

## 2023-04-29 NOTE — Transfer of Care (Signed)
 Immediate Anesthesia Transfer of Care Note  Patient: Isabella Walker  Procedure(s) Performed: COLONOSCOPY EGD (ESOPHAGOGASTRODUODENOSCOPY)  Patient Location: Endoscopy Unit  Anesthesia Type:General  Level of Consciousness: awake and alert   Airway & Oxygen Therapy: Patient Spontanous Breathing  Post-op Assessment: Report given to RN and Post -op Vital signs reviewed and stable  Post vital signs: Reviewed and stable  Last Vitals:  Vitals Value Taken Time  BP 117/65   Temp 36.4 C 04/29/23 0957  Pulse 72 04/29/23 0957  Resp 16   SpO2 100 % 04/29/23 0957    Last Pain:  Vitals:   04/29/23 0957  TempSrc: Oral  PainSc:       Patients Stated Pain Goal: 8 (04/29/23 0757)  Complications: No notable events documented.

## 2023-04-29 NOTE — Discharge Instructions (Addendum)
 EGD Discharge instructions Please read the instructions outlined below and refer to this sheet in the next few weeks. These discharge instructions provide you with general information on caring for yourself after you leave the hospital. Your doctor may also give you specific instructions. While your treatment has been planned according to the most current medical practices available, unavoidable complications occasionally occur. If you have any problems or questions after discharge, please call your doctor. ACTIVITY You may resume your regular activity but move at a slower pace for the next 24 hours.  Take frequent rest periods for the next 24 hours.  Walking will help expel (get rid of) the air and reduce the bloated feeling in your abdomen.  No driving for 24 hours (because of the anesthesia (medicine) used during the test).  You may shower.  Do not sign any important legal documents or operate any machinery for 24 hours (because of the anesthesia used during the test).  NUTRITION Drink plenty of fluids.  You may resume your normal diet.  Begin with a light meal and progress to your normal diet.  Avoid alcoholic beverages for 24 hours or as instructed by your caregiver.  MEDICATIONS You may resume your normal medications unless your caregiver tells you otherwise.  WHAT YOU CAN EXPECT TODAY You may experience abdominal discomfort such as a feeling of fullness or "gas" pains.  FOLLOW-UP Your doctor will discuss the results of your test with you.  SEEK IMMEDIATE MEDICAL ATTENTION IF ANY OF THE FOLLOWING OCCUR: Excessive nausea (feeling sick to your stomach) and/or vomiting.  Severe abdominal pain and distention (swelling).  Trouble swallowing.  Temperature over 101 F (37.8 C).  Rectal bleeding or vomiting of blood.      Colonoscopy Discharge Instructions  Read the instructions outlined below and refer to this sheet in the next few weeks. These discharge instructions provide you  with general information on caring for yourself after you leave the hospital. Your doctor may also give you specific instructions. While your treatment has been planned according to the most current medical practices available, unavoidable complications occasionally occur.   ACTIVITY You may resume your regular activity, but move at a slower pace for the next 24 hours.  Take frequent rest periods for the next 24 hours.  Walking will help get rid of the air and reduce the bloated feeling in your belly (abdomen).  No driving for 24 hours (because of the medicine (anesthesia) used during the test).   Do not sign any important legal documents or operate any machinery for 24 hours (because of the anesthesia used during the test).  NUTRITION Drink plenty of fluids.  You may resume your normal diet as instructed by your doctor.  Begin with a light meal and progress to your normal diet. Heavy or fried foods are harder to digest and may make you feel sick to your stomach (nauseated).  Avoid alcoholic beverages for 24 hours or as instructed.  MEDICATIONS You may resume your normal medications unless your doctor tells you otherwise.  WHAT YOU CAN EXPECT TODAY Some feelings of bloating in the abdomen.  Passage of more gas than usual.  Spotting of blood in your stool or on the toilet paper.  IF YOU HAD POLYPS REMOVED DURING THE COLONOSCOPY: No aspirin products for 7 days or as instructed.  No alcohol for 7 days or as instructed.  Eat a soft diet for the next 24 hours.  FINDING OUT THE RESULTS OF YOUR TEST Not all test results  are available during your visit. If your test results are not back during the visit, make an appointment with your caregiver to find out the results. Do not assume everything is normal if you have not heard from your caregiver or the medical facility. It is important for you to follow up on all of your test results.  SEEK IMMEDIATE MEDICAL ATTENTION IF: You have more than a  spotting of blood in your stool.  Your belly is swollen (abdominal distention).  You are nauseated or vomiting.  You have a temperature over 101.  You have abdominal pain or discomfort that is severe or gets worse throughout the day.   Your EGD revealed mild amount inflammation in your stomach.  I took biopsies of this to rule out infection with a bacteria called H. pylori.  You also have evidence of short segment Barrett's esophagus.  I took samples of this as well.  Will call with these results.  Small bowel appeared normal.  Your colonoscopy revealed 7 polyp(s) which I removed successfully. Await pathology results, my office will contact you. I recommend repeating colonoscopy in 3 years for surveillance purposes.   You also have diverticulosis and internal hemorrhoids. I would recommend increasing fiber in your diet or adding OTC Benefiber/Metamucil. Be sure to drink at least 4 to 6 glasses of water daily.mild inflammation noted in your sigmoid colon which I took samples of as well.    I will discuss my findings with Dr. Collene Dawson.  Follow-up in GI office in 8 weeks.  Office will call with appointment.   I hope you have a great rest of your week!  Isabella Walker. Mordechai Mamta, D.O. Gastroenterology and Hepatology [facility]

## 2023-04-29 NOTE — Anesthesia Preprocedure Evaluation (Signed)
 Anesthesia Evaluation  Patient identified by MRN, date of birth, ID band Patient awake    Reviewed: Allergy & Precautions, H&P , NPO status , Patient's Chart, lab work & pertinent test results, reviewed documented beta blocker date and time   History of Anesthesia Complications (+) history of anesthetic complications  Airway Mallampati: II  TM Distance: >3 FB Neck ROM: full    Dental no notable dental hx.    Pulmonary neg pulmonary ROS, Current Smoker   Pulmonary exam normal breath sounds clear to auscultation       Cardiovascular Exercise Tolerance: Good hypertension,  Rhythm:regular Rate:Normal     Neuro/Psych  Headaches PSYCHIATRIC DISORDERS Anxiety Depression Bipolar Disorder    Neuromuscular disease    GI/Hepatic Neg liver ROS,GERD  ,,  Endo/Other  diabetes    Renal/GU negative Renal ROS  negative genitourinary   Musculoskeletal   Abdominal   Peds  Hematology negative hematology ROS (+)   Anesthesia Other Findings   Reproductive/Obstetrics negative OB ROS                             Anesthesia Physical Anesthesia Plan  ASA: 2  Anesthesia Plan: General   Post-op Pain Management:    Induction:   PONV Risk Score and Plan: Propofol  infusion  Airway Management Planned:   Additional Equipment:   Intra-op Plan:   Post-operative Plan:   Informed Consent: I have reviewed the patients History and Physical, chart, labs and discussed the procedure including the risks, benefits and alternatives for the proposed anesthesia with the patient or authorized representative who has indicated his/her understanding and acceptance.     Dental Advisory Given  Plan Discussed with: CRNA  Anesthesia Plan Comments:        Anesthesia Quick Evaluation

## 2023-04-30 ENCOUNTER — Encounter: Payer: Self-pay | Admitting: Internal Medicine

## 2023-04-30 ENCOUNTER — Encounter (HOSPITAL_COMMUNITY): Payer: Self-pay | Admitting: Internal Medicine

## 2023-04-30 LAB — SURGICAL PATHOLOGY

## 2023-05-02 NOTE — Anesthesia Postprocedure Evaluation (Signed)
 Anesthesia Post Note  Patient: Isabella Walker  Procedure(s) Performed: COLONOSCOPY EGD (ESOPHAGOGASTRODUODENOSCOPY)  Patient location during evaluation: Phase II Anesthesia Type: General Level of consciousness: awake Pain management: pain level controlled Vital Signs Assessment: post-procedure vital signs reviewed and stable Respiratory status: spontaneous breathing and respiratory function stable Cardiovascular status: blood pressure returned to baseline and stable Postop Assessment: no headache and no apparent nausea or vomiting Anesthetic complications: no Comments: Late entry   No notable events documented.   Last Vitals:  Vitals:   04/29/23 0757 04/29/23 0957  BP: 133/74 117/65  Pulse: 83 72  Resp: 20 20  Temp: 36.8 C 36.4 C  SpO2: 100% 100%    Last Pain:  Vitals:   04/29/23 0957  TempSrc: Oral  PainSc: 0-No pain                 Coretha Dew

## 2023-05-06 ENCOUNTER — Encounter: Payer: Self-pay | Admitting: *Deleted

## 2023-06-03 ENCOUNTER — Ambulatory Visit: Admitting: General Surgery

## 2023-06-10 ENCOUNTER — Ambulatory Visit: Admitting: General Surgery

## 2023-06-10 ENCOUNTER — Telehealth: Payer: Self-pay | Admitting: *Deleted

## 2023-06-10 ENCOUNTER — Encounter: Payer: Self-pay | Admitting: General Surgery

## 2023-06-10 VITALS — BP 142/83 | HR 79 | Temp 98.0°F | Resp 14 | Ht 63.0 in | Wt 230.0 lb

## 2023-06-10 DIAGNOSIS — K5732 Diverticulitis of large intestine without perforation or abscess without bleeding: Secondary | ICD-10-CM

## 2023-06-10 MED ORDER — NEOMYCIN SULFATE 500 MG PO TABS: 1000.0000 mg | ORAL_TABLET | ORAL | 0 refills | Status: DC

## 2023-06-10 MED ORDER — METRONIDAZOLE 500 MG/100ML IV SOLN: 1000.0000 mg | INTRAVENOUS | 0 refills | Status: DC

## 2023-06-10 MED ORDER — METRONIDAZOLE 500 MG PO TABS: ORAL_TABLET | ORAL | 0 refills | Status: DC

## 2023-06-10 NOTE — Telephone Encounter (Signed)
 Received fax requesting prior authorization for Neomycin Sulfate.  PA submitted via CoverMyMeds.  Dx: K57.32- Sigmoid Diverticulitis   [employer] is reviewing your PA request. Typically an electronic response will be received within 24-72 hours

## 2023-06-10 NOTE — Progress Notes (Unsigned)
 [facility] History and Physical  Reason for Referral:*** Referring Physician: ***  Chief Complaint   Follow-up     Isabella Walker is a 43 y.o. female.  HPI:   Discussed the use of AI scribe software for clinical note transcription with the patient, who gave verbal consent to proceed.  History of Present Illness      ***.  The *** started *** and has had a duration of ***.  It is associated with ***.  The *** is improved with ***, and is made worse with ***.    Quality*** Context***  Past Medical History:  Diagnosis Date   Anxiety    Bipolar 1 disorder (HCC)    Complication of anesthesia    Depression complicating pregnancy, postpartum 2012   Diabetes mellitus without complication (HCC)    GERD (gastroesophageal reflux disease)    Headache(784.0)    hx of migraines-last one few months ago.   High cholesterol    Hypertension    IBS (irritable bowel syndrome)    Migraines    PTSD (post-traumatic stress disorder)    Thyroid disease     Past Surgical History:  Procedure Laterality Date   CARPAL TUNNEL RELEASE Left 08/20/2018   Procedure: CARPAL TUNNEL RELEASE;  Surgeon: Darrin Emerald, MD;  Location: [facility];  Service: Orthopedics;  Laterality: Left;   COLONOSCOPY N/A 04/29/2023   Procedure: COLONOSCOPY;  Surgeon: Vinetta Greening, DO;  Location: [facility];  Service: Endoscopy;  Laterality: N/A;  900AM, ok RM 1/2   ESOPHAGOGASTRODUODENOSCOPY N/A 04/29/2023   Procedure: EGD (ESOPHAGOGASTRODUODENOSCOPY);  Surgeon: Vinetta Greening, DO;  Location: [facility];  Service: Endoscopy;  Laterality: N/A;   INSERTION OF MESH N/A 05/25/2012   Procedure: INSERTION OF MESH;  Surgeon: Beau Bound, MD;  Location: [facility];  Service: General;  Laterality: N/A;   ORIF KNEE DISLOCATION Right    VENTRAL HERNIA REPAIR N/A 05/25/2012   Procedure: LAPAROSCOPIC VENTRAL HERNIA;  Surgeon: Beau Bound, MD;  Location: [facility];  Service: General;  Laterality:  N/A;    Family History  Problem Relation Age of Onset   High blood pressure Mother    Diverticulitis Mother        had colostomy   Diabetes Father    High blood pressure Father    Diverticulitis Maternal Grandmother        had to have colon surgery   Cancer Paternal Aunt        ovarian   Colon cancer Neg Hx     Social History   Tobacco Use   Smoking status: Every Day    Current packs/day: 0.00    Average packs/day: 1 pack/day for 15.0 years (15.0 ttl pk-yrs)    Types: Cigarettes    Start date: 01/31/2002    Last attempt to quit: 01/31/2017    Years since quitting: 6.3   Smokeless tobacco: Never  Vaping Use   Vaping status: Former  Substance Use Topics   Alcohol use: Never   Drug use: Yes    Types: Marijuana    Medications: {medication reviewed/display:3041432} Allergies as of 06/10/2023       Reactions   Bextra [valdecoxib] Other (See Comments)   SEVERE NOSE BLEEDS        Medication List        Accurate as of June 10, 2023 12:14 PM. If you have any questions, ask your nurse or doctor.  STOP taking these medications    METFORMIN HCL PO Stopped by: Awilda Bogus       TAKE these medications    Accu-Chek Guide test strip Generic drug: glucose blood   acetaminophen  325 MG tablet Commonly known as: TYLENOL  Take 2 tablets (650 mg total) by mouth every 6 (six) hours as needed for mild pain (pain score 1-3), fever or headache (or Fever >/= 101).   levonorgestrel 20 MCG/24HR IUD Commonly known as: MIRENA 1 each by Intrauterine route once.   ondansetron  4 MG tablet Commonly known as: Zofran  Take 1 tablet (4 mg total) by mouth daily as needed for nausea or vomiting.   pantoprazole  40 MG tablet Commonly known as: PROTONIX  Take 1 tablet (40 mg total) by mouth 2 (two) times daily.   senna-docusate 8.6-50 MG tablet Commonly known as: Senokot-S Take 2 tablets by mouth at bedtime. What changed:  how much to take when to take this    simvastatin 10 MG tablet Commonly known as: ZOCOR Take 10 mg by mouth daily.         ROS:  {Review of Systems:30496}  Blood pressure (!) 142/83, pulse 79, temperature 98 F (36.7 C), temperature source Oral, resp. rate 14, height 5\' 3"  (1.6 m), weight 230 lb (104.3 kg), SpO2 97%. Physical Exam Physical Exam   Results: No results found for this or any previous visit (from the past 48 hours).  No results found.   Assessment and Plan: Assessment and Plan Assessment & Plan      Isabella Walker is a 43 y.o. female with *** -*** -*** -Follow up ***  All questions were answered to the satisfaction of the patient and family***.  The risk and benefits of *** were discussed including but not limited to ***.  After careful consideration, Isabella Walker has decided to ***.    Awilda Bogus 06/10/2023, 12:14 PM

## 2023-06-10 NOTE — Patient Instructions (Addendum)
 Between now and surgery- drink at least 64 ounces of water and try benefiber/ metamucil to help with your stools and get them softer. If this does not work take colace 100 mg twice daily.   Colon Preparation: Buy from the Store: Miralax bottle [phone]).  Gatorade 64 oz (not red). Dulcolax tablets.   The Day Prior to Surgery: Take 4 ducolax tablets at 7am with water. Drink plenty of clear liquids all day to avoid dehydration, no solid food.    Mix the bottle of Miralax and 64 oz of Gatorade and drink this mixture starting at 10am. Drink it gradually over the next few hours, 8 ounces every 15-30 minutes until it is gone. Finish this by 2pm.  Take 2 neomycin 500mg  tablets and 2 metronidazole 500mg  tablets at 2 pm. Take 2 neomycin 500mg  tablets and 2 metronidazole 500mg  tablets at 3pm. Take 2 neomycin 500mg  tablets and 2 metronidazole 500mg  tablets at 10pm.    Do not eat or drink anything after midnight the night before your surgery.  Do not eat or drink anything that morning, and take medications as instructed by the hospital staff on your preoperative visit.

## 2023-06-11 NOTE — Telephone Encounter (Signed)
 Received faxed determination.   [id number]. NEOMYCIN TAB 500MG  is approved through 09/10/2023.

## 2023-06-12 NOTE — H&P (Signed)
 [facility] History and Physical    Chief Complaint   Follow-up     Isabella Walker is a 43 y.o. female.  HPI:   The patient is known to me with a recent episodes of microperforation and diverticulitis. She was treated in the hospital with antibiotics. She had a history of diverticulitis a few years prior but this episode was worse.  She has had her colonoscopy with Dr. Mordechai Vonne that saw diverticula in the sigmoid colon. Polyps were removed and were benign.   She has had several relatives with emergency surgery for diverticulitis and required end colostomy. She wants to avoid this and would rather proceed with elective surgery. She is having some chronic LLQ pain.    She has had a prior laparoscopic umbilical hernia repair with mesh with Dr. Larrie Po and it appears that this was a 10X15cm mesh in 2014.   Past Medical History:  Diagnosis Date   Anxiety    Bipolar 1 disorder (HCC)    Complication of anesthesia    Depression complicating pregnancy, postpartum 2012   Diabetes mellitus without complication (HCC)    GERD (gastroesophageal reflux disease)    Headache(784.0)    hx of migraines-last one few months ago.   High cholesterol    Hypertension    IBS (irritable bowel syndrome)    Migraines    PTSD (post-traumatic stress disorder)    Thyroid disease     Past Surgical History:  Procedure Laterality Date   CARPAL TUNNEL RELEASE Left 08/20/2018   Procedure: CARPAL TUNNEL RELEASE;  Surgeon: Darrin Emerald, MD;  Location: [facility];  Service: Orthopedics;  Laterality: Left;   COLONOSCOPY N/A 04/29/2023   Procedure: COLONOSCOPY;  Surgeon: Vinetta Greening, DO;  Location: [facility];  Service: Endoscopy;  Laterality: N/A;  900AM, ok RM 1/2   ESOPHAGOGASTRODUODENOSCOPY N/A 04/29/2023   Procedure: EGD (ESOPHAGOGASTRODUODENOSCOPY);  Surgeon: Vinetta Greening, DO;  Location: [facility];  Service: Endoscopy;  Laterality: N/A;   INSERTION OF MESH N/A 05/25/2012    Procedure: INSERTION OF MESH;  Surgeon: Beau Bound, MD;  Location: [facility];  Service: General;  Laterality: N/A;   ORIF KNEE DISLOCATION Right    VENTRAL HERNIA REPAIR N/A 05/25/2012   Procedure: LAPAROSCOPIC VENTRAL HERNIA;  Surgeon: Beau Bound, MD;  Location: [facility];  Service: General;  Laterality: N/A;    Family History  Problem Relation Age of Onset   High blood pressure Mother    Diverticulitis Mother        had colostomy   Diabetes Father    High blood pressure Father    Diverticulitis Maternal Grandmother        had to have colon surgery   Cancer Paternal Aunt        ovarian   Colon cancer Neg Hx     Social History   Tobacco Use   Smoking status: Every Day    Current packs/day: 0.00    Average packs/day: 1 pack/day for 15.0 years (15.0 ttl pk-yrs)    Types: Cigarettes    Start date: 01/31/2002    Last attempt to quit: 01/31/2017    Years since quitting: 6.3   Smokeless tobacco: Never  Vaping Use   Vaping status: Former  Substance Use Topics   Alcohol use: Never   Drug use: Yes    Types: Marijuana    Medications: I have reviewed the patient's current medications. Allergies as of 06/10/2023  Reactions   Bextra [valdecoxib] Other (See Comments)   SEVERE NOSE BLEEDS        Medication List        Accurate as of June 10, 2023 12:14 PM. If you have any questions, ask your nurse or doctor.          STOP taking these medications    METFORMIN HCL PO Stopped by: Awilda Bogus       TAKE these medications    Accu-Chek Guide test strip Generic drug: glucose blood   acetaminophen  325 MG tablet Commonly known as: TYLENOL  Take 2 tablets (650 mg total) by mouth every 6 (six) hours as needed for mild pain (pain score 1-3), fever or headache (or Fever >/= 101).   levonorgestrel 20 MCG/24HR IUD Commonly known as: MIRENA 1 each by Intrauterine route once.   ondansetron  4 MG tablet Commonly known as: Zofran  Take 1 tablet (4 mg total) by  mouth daily as needed for nausea or vomiting.   pantoprazole  40 MG tablet Commonly known as: PROTONIX  Take 1 tablet (40 mg total) by mouth 2 (two) times daily.   senna-docusate 8.6-50 MG tablet Commonly known as: Senokot-S Take 2 tablets by mouth at bedtime. What changed:  how much to take when to take this   simvastatin 10 MG tablet Commonly known as: ZOCOR Take 10 mg by mouth daily.         ROS:  A comprehensive review of systems was negative except for: Gastrointestinal: positive for abdominal pain  Blood pressure (!) 142/83, pulse 79, temperature 98 F (36.7 C), temperature source Oral, resp. rate 14, height 5\' 3"  (1.6 m), weight 230 lb (104.3 kg), SpO2 97%. Physical Exam Vitals reviewed.  HENT:     Head: Normocephalic.     Nose: Nose normal.  Eyes:     Extraocular Movements: Extraocular movements intact.  Cardiovascular:     Rate and Rhythm: Normal rate.  Pulmonary:     Effort: Pulmonary effort is normal.     Breath sounds: Normal breath sounds.  Abdominal:     General: There is no distension.     Palpations: Abdomen is soft.     Tenderness: There is abdominal tenderness in the suprapubic area and left lower quadrant.  Musculoskeletal:        General: Normal range of motion.  Skin:    General: Skin is warm.  Neurological:     General: No focal deficit present.     Mental Status: She is alert.  Psychiatric:        Mood and Affect: Mood normal.        Behavior: Behavior normal.     Results: CT 02/2023- diverticulitis and thickening of the sigmoid colon    Assessment and Plan:  [name] is a 43 y.o. female with recurrent diverticulitis. Discussed proceed with an elective excision to prevent her from having a colostomy which has happened to several of her family members.   Discussed robotic assisted laparoscopic partial colectomy versus open and possible need to go through part of her mesh. Discussed risk of bleeding, infection, injury to other  organs, injury to ureter, need for open procedure, hernia recurrence. Discussed the post operative stay in the hospital and the preoperative bowel preparation. Discussed that Dr. Larrie Po would be seeing her over the weekend.   Buy from the Store: Miralax bottle [phone]).  Gatorade 64 oz (not red). Dulcolax tablets.   The Day Prior to Surgery: Take 4 ducolax tablets at  7am with water. Drink plenty of clear liquids all day to avoid dehydration, no solid food.    Mix the bottle of Miralax and 64 oz of Gatorade and drink this mixture starting at 10am. Drink it gradually over the next few hours, 8 ounces every 15-30 minutes until it is gone. Finish this by 2pm.  Take 2 neomycin 500mg  tablets and 2 metronidazole 500mg  tablets at 2 pm. Take 2 neomycin 500mg  tablets and 2 metronidazole 500mg  tablets at 3pm. Take 2 neomycin 500mg  tablets and 2 metronidazole 500mg  tablets at 10pm.    Do not eat or drink anything after midnight the night before your surgery.  Do not eat or drink anything that morning, and take medications as instructed by the hospital staff on your preoperative visit.   All questions were answered to the satisfaction of the patient and family.    Awilda Bogus 06/10/2023, 12:14 PM

## 2023-06-23 NOTE — Patient Instructions (Signed)
 Isabella Walker  06/23/2023     @PREFPERIOPPHARMACY @   Your procedure is scheduled on  06/27/2023.   Report to [facility] at 0630  A.M.   Call this number if you have problems the morning of surgery:  [phone]  If you experience any cold or flu symptoms such as cough, fever, chills, shortness of breath, etc. between now and your scheduled surgery, please notify us  at the above number.   Remember:         DO NOT take any medications for diabetes the morning of your procedure.           Colon Preparation: Buy from the Store: Miralax bottle [phone]).  Gatorade 64 oz (not red). Dulcolax tablets.    The Day Prior to Surgery: Take 4 ducolax tablets at 7am with water.  Drink plenty of clear liquids all day to avoid dehydration, no solid food.    Mix the bottle of Miralax and 64 oz of Gatorade and drink this mixture starting at 10am.   Drink it gradually over the next few hours, 8 ounces every 15-30 minutes until it is gone.   Finish this by 2pm.   Take 2 neomycin  500mg  tablets and 2 metronidazole  500mg  tablets at 2 pm.  Take 2 neomycin  500mg  tablets and 2 metronidazole  500mg  tablets at 3pm.  Take 2 neomycin  500mg  tablets and 2 metronidazole  500mg  tablets at 10pm.       Drink 2 bottles of GZero -(12 oz bottles) or 24 oz of water at 10 pm.    Continue with clear liquids until 0430 am on 06/27/2023.         You may drink clear liquids until  0430 am on 6/20/202.     Clear liquids allowed are:                    Water, Juice (No red color; non-citric and without pulp; diabetics please choose diet or no sugar options), Carbonated beverages (diabetics please choose diet or no sugar options), Clear Tea (No creamer, milk, or cream, including half & half and powdered creamer), Black Coffee Only (No creamer, milk or cream, including half & half and powdered creamer), and Clear Sports drink (No red color; diabetics please choose diet or no sugar options)    Take  these medicines the morning of surgery with A SIP OF WATER                                                  pantoprazole .    Do not wear jewelry, make-up or nail polish, including gel polish,  artificial nails, or any other type of covering on natural nails (fingers and  toes).  Do not wear lotions, powders, or perfumes, or deodorant.  Do not shave 48 hours prior to surgery.  Men may shave face and neck.  Do not bring valuables to the hospital.  [facility] is not responsible for any belongings or valuables.  Contacts, dentures or bridgework may not be worn into surgery.  Leave your suitcase in the car.  After surgery it may be brought to your room.  For patients admitted to the hospital, discharge time will be determined by your treatment team.  Patients discharged the day of surgery will not be allowed to drive home and must  have someone with them for 24 hours.    Special instructions:  DO NOT smoke tobacco or vape for 24 hours before your procedure.  Please read over the following fact sheets that you were given. Pain Booklet, Coughing and Deep Breathing, Blood Transfusion Information, Lab Information, Surgical Site Infection Prevention, Anesthesia Post-op Instructions, and Care and Recovery After Surgery        Minimally Invasive Partial Colectomy, Adult, Care After The following information offers guidance on how to care for yourself after your procedure. Your health care provider may also give you more specific instructions. If you have problems or questions, contact your health care provider. What can I expect after the surgery? After the procedure, it is common to have: Pain, bruising, and swelling. Bloating. Weakness and tiredness (fatigue). Changes to your bowel movements, especially having bowel movements more often. Follow these instructions at home: Medicines Take over-the-counter and prescription medicines only as told by your health care provider. If you were  prescribed an antibiotic medicine, take it as told by your health care provider. Do not stop using the antibiotic even if you start to feel better. Ask your health care provider if the medicine prescribed to you: Requires you to avoid driving or using machinery. Can cause constipation. You may need to take these actions to prevent or treat constipation: Drink enough fluids to keep your urine pale yellow. Take over-the-counter or prescription medicines. Limit foods that are high in fat and processed sugars, such as fried or sweet foods. Eating and drinking Follow instructions from your health care provider about what you may eat and drink. Do not drink alcohol if your health care provider tells you not to drink. Eat a low-fiber diet for the first 4 weeks after surgery or as told by your health care provider.. Most people on a low-fiber eating plan should eat less than 10 grams (g) of fiber a day. Follow recommendations from your health care provider or dietitian about how much fiber you should have each day. Always check food labels to know the fiber content of packaged foods. In general, a low-fiber food will have fewer than 2 g of fiber per serving. In general, try to avoid whole grains, raw fruits and vegetables, dried fruit, tough cuts of meat, nuts, and seeds. Incision care  Follow instructions from your health care provider about how to take care of your incisions. Make sure you: Wash your hands with soap and water for at least 20 seconds before and after you change your bandage (dressing). If soap and water are not available, use hand sanitizer. Change your dressing as told by your health care provider. Leave stitches (sutures), skin glue, or adhesive strips in place. These skin closures may need to stay in place for 2 weeks or longer. If adhesive strip edges start to loosen and curl up, you may trim the loose edges. Do not remove adhesive strips completely unless your health care provider  tells you to do that. Check your incision area every day for signs of infection. Check for: More redness, swelling, or pain. Fluid or blood. Warmth. Pus or a bad smell. Activity Rest as told by your health care provider. Avoid sitting for a long time without moving. Get up to take short walks every 1-2 hours. This is important to improve blood flow and breathing. Ask for help if you feel weak or unsteady. You may have to avoid lifting. Ask your health care provider how much you can safely lift. Return to your  normal activities as told by your health care provider. Ask your health care provider what activities are safe for you. General instructions Do not use any products that contain nicotine  or tobacco. These products include cigarettes, chewing tobacco, and vaping devices, such as e-cigarettes. If you need help quitting, ask your health care provider. Do not take baths, swim, or use a hot tub until your health care provider approves. Ask your health care provider if you may take showers. You may only be allowed to take sponge baths. Wear compression stockings as told by your health care provider. These stockings help to prevent blood clots and reduce swelling in your legs. Keep all follow-up visits. This is important to monitor healing and check for any complications. Contact a health care provider if: Medicine is not controlling your pain. You have chills or fever. You have any signs of infection in your incision areas. You have a persistent cough. You have nausea or vomiting. You develop a rash. You have not had a bowel movement in 3 days. Get help right away if: You have severe pain. Your incisions break open after sutures or staples have been removed. You are bleeding from your rectum or have blood in your stool. You have a warm, tender swelling in your leg. You have chest pain or trouble breathing. You have increased swelling in the abdomen. You feel light-headed or you  faint. These symptoms may be an emergency. Get help right away. Call 911. Do not wait to see if the symptoms will go away. Do not drive yourself to the hospital. Summary After surgery, it is common to have some pain, bruising, swelling, bloating, tiredness, weakness, or changes to your bowel movements. Follow instructions from your health care provider about what to eat and drink. Return to your normal activities as told by your health care provider. Check your incision area every day for signs of infection. Get help right away if you have chest pain or trouble breathing. This information is not intended to replace advice given to you by your health care provider. Make sure you discuss any questions you have with your health care provider. Document Revised: 04/11/2021 Document Reviewed: 04/11/2021 Elsevier Patient Education  2024 [employer]General Anesthesia, Adult, Care After The following information offers guidance on how to care for yourself after your procedure. Your health care provider may also give you more specific instructions. If you have problems or questions, contact your health care provider. What can I expect after the procedure? After the procedure, it is common for people to: Have pain or discomfort at the IV site. Have nausea or vomiting. Have a sore throat or hoarseness. Have trouble concentrating. Feel cold or chills. Feel weak, sleepy, or tired (fatigue). Have soreness and body aches. These can affect parts of the body that were not involved in surgery. Follow these instructions at home: For the time period you were told by your health care provider:  Rest. Do not participate in activities where you could fall or become injured. Do not drive or use machinery. Do not drink alcohol. Do not take sleeping pills or medicines that cause drowsiness. Do not make important decisions or sign legal documents. Do not take care of children on your own. General  instructions Drink enough fluid to keep your urine pale yellow. If you have sleep apnea, surgery and certain medicines can increase your risk for breathing problems. Follow instructions from your health care provider about wearing your sleep device: Anytime you are sleeping, including during  daytime naps. While taking prescription pain medicines, sleeping medicines, or medicines that make you drowsy. Return to your normal activities as told by your health care provider. Ask your health care provider what activities are safe for you. Take over-the-counter and prescription medicines only as told by your health care provider. Do not use any products that contain nicotine  or tobacco. These products include cigarettes, chewing tobacco, and vaping devices, such as e-cigarettes. These can delay incision healing after surgery. If you need help quitting, ask your health care provider. Contact a health care provider if: You have nausea or vomiting that does not get better with medicine. You vomit every time you eat or drink. You have pain that does not get better with medicine. You cannot urinate or have bloody urine. You develop a skin rash. You have a fever. Get help right away if: You have trouble breathing. You have chest pain. You vomit blood. These symptoms may be an emergency. Get help right away. Call 911. Do not wait to see if the symptoms will go away. Do not drive yourself to the hospital. Summary After the procedure, it is common to have a sore throat, hoarseness, nausea, vomiting, or to feel weak, sleepy, or fatigue. For the time period you were told by your health care provider, do not drive or use machinery. Get help right away if you have difficulty breathing, have chest pain, or vomit blood. These symptoms may be an emergency. This information is not intended to replace advice given to you by your health care provider. Make sure you discuss any questions you have with your health  care provider. Document Revised: 03/23/2021 Document Reviewed: 03/23/2021 Elsevier Patient Education  2024 [employer] How to Use Chlorhexidine  at Home in the Shower Chlorhexidine  gluconate (CHG) is a germ-killing (antiseptic) wash that's used to clean the skin. It can get rid of the germs that normally live on the skin and can keep them away for about 24 hours. If you're having surgery, you may be told to shower with CHG at home the night before surgery. This can help lower your risk for infection. To use CHG wash in the shower, follow the steps below. Supplies needed: CHG body wash. Clean washcloth. Clean towel. How to use CHG in the shower Follow these steps unless you're told to use CHG in a different way: Start the shower. Use your normal soap and shampoo to wash your face and hair. Turn off the shower or move out of the shower stream. Pour CHG onto a clean washcloth. Do not use any type of brush or rough sponge. Start at your neck, washing your body down to your toes. Make sure you: Wash the part of your body where the surgery will be done for at least 1 minute. Do not scrub. Do not use CHG on your head or face unless your health care provider tells you to. If it gets into your ears or eyes, rinse them well with water. Do not wash your genitals with CHG. Wash your back and under your arms. Make sure to wash skin folds. Let the CHG sit on your skin for 1-2 minutes or as long as told. Rinse your entire body in the shower, including all body creases and folds. Turn off the shower. Dry off with a clean towel. Do not put anything on your skin afterward, such as powder, lotion, or perfume. Put on clean clothes or pajamas. If it's the night before surgery, sleep in clean sheets. General tips Use  CHG only as told, and follow the instructions on the label. Use the full amount of CHG as told. This is often one bottle. Do not smoke and stay away from flames after using CHG. Your skin  may feel sticky after using CHG. This is normal. The sticky feeling will go away as the CHG dries. Do not use CHG: If you have a chlorhexidine  allergy or have reacted to chlorhexidine  in the past. On open wounds or areas of skin that have broken skin, cuts, or scrapes. On babies younger than 28 months of age. Contact a health care provider if: You have questions about using CHG. Your skin gets irritated or itchy. You have a rash after using CHG. You swallow any CHG. Call your local poison control center [phone] in the [place] Your eyes itch badly, or they become very red or swollen. Your hearing changes. You have trouble seeing. If you can't reach your provider, go to an urgent care or emergency room. Do not drive yourself. Get help right away if: You have swelling or tingling in your mouth or throat. You make high-pitched whistling sounds when you breathe, most often when you breathe out (wheeze). You have trouble breathing. These symptoms may be an emergency. Call 911 right away. Do not wait to see if the symptoms will go away. Do not drive yourself to the hospital. This information is not intended to replace advice given to you by your health care provider. Make sure you discuss any questions you have with your health care provider. Document Revised: 07/09/2022 Document Reviewed: 07/05/2021 Elsevier Patient Education  2024 [employer].How to Use an Incentive Spirometer An incentive spirometer is a tool that measures how well you are filling your lungs with each breath. Learning to take long, deep breaths using this tool can help you keep your lungs clear and active. This may help to reverse or lessen your chance of developing breathing (pulmonary) problems, especially infection. You may be asked to use a spirometer: After a surgery. If you have a lung problem or a history of smoking. After a long period of time when you have been unable to move or be active. If the spirometer  includes an indicator to show the highest number that you have reached, your health care provider or respiratory therapist will help you set a goal. Keep a log of your progress as told by your health care provider. What are the risks? Breathing too quickly may cause dizziness or cause you to pass out. Take your time so you do not get dizzy or light-headed. If you are in pain, you may need to take pain medicine before doing incentive spirometry. It is harder to take a deep breath if you are having pain. How to use your incentive spirometer  Sit up on the edge of your bed or on a chair. Hold the incentive spirometer so that it is in an upright position. Before you use the spirometer, breathe out normally. Place the mouthpiece in your mouth. Make sure your lips are closed tightly around it. Breathe in slowly and as deeply as you can through your mouth, causing the piston or the ball to rise toward the top of the chamber. Hold your breath for 3-5 seconds, or for as long as possible. If the spirometer includes a coach indicator, use this to guide you in breathing. Slow down your breathing if the indicator goes above the marked areas. Remove the mouthpiece from your mouth and breathe out normally. The piston or  ball will return to the bottom of the chamber. Rest for a few seconds, then repeat the steps 10 or more times. Take your time and take a few normal breaths between deep breaths so that you do not get dizzy or light-headed. Do this every 1-2 hours when you are awake. If the spirometer includes a goal marker to show the highest number you have reached (best effort), use this as a goal to work toward during each repetition. After each set of 10 deep breaths, cough a few times. This will help to make sure that your lungs are clear. If you have an incision on your chest or abdomen from surgery, place a pillow or a rolled-up towel firmly against the incision when you cough. This can help to reduce pain  while taking deep breaths and coughing. General tips When you are able to get out of bed: Walk around often. Continue to take deep breaths and cough in order to clear your lungs. Keep using the incentive spirometer until your health care provider says it is okay to stop using it. If you have been in the hospital, you may be told to keep using the spirometer at home. Contact a health care provider if: You are having difficulty using the spirometer. You have trouble using the spirometer as often as instructed. Your pain medicine is not giving enough relief for you to use the spirometer as told. You have a fever. Get help right away if: You develop shortness of breath. You develop a cough with bloody mucus from the lungs. You have fluid or blood coming from an incision site after you cough. Summary An incentive spirometer is a tool that can help you learn to take long, deep breaths to keep your lungs clear and active. You may be asked to use a spirometer after a surgery, if you have a lung problem or a history of smoking, or if you have been inactive for a long period of time. Use your incentive spirometer as instructed every 1-2 hours while you are awake. If you have an incision on your chest or abdomen, place a pillow or a rolled-up towel firmly against your incision when you cough. This will help to reduce pain. Get help right away if you have shortness of breath, you cough up bloody mucus, or blood comes from your incision when you cough. This information is not intended to replace advice given to you by your health care provider. Make sure you discuss any questions you have with your health care provider. Document Revised: 11/01/2022 Document Reviewed: 11/01/2022 Elsevier Patient Education  2024 [employer].

## 2023-06-24 ENCOUNTER — Ambulatory Visit: Admitting: Gastroenterology

## 2023-06-24 ENCOUNTER — Other Ambulatory Visit: Payer: Self-pay

## 2023-06-24 ENCOUNTER — Encounter (HOSPITAL_COMMUNITY)
Admission: RE | Admit: 2023-06-24 | Discharge: 2023-06-24 | Disposition: A | Discharge status: Home / Self Care | Source: Ambulatory Visit | Attending: General Surgery | Admitting: General Surgery

## 2023-06-24 ENCOUNTER — Encounter (HOSPITAL_COMMUNITY): Payer: Self-pay

## 2023-06-24 DIAGNOSIS — K5732 Diverticulitis of large intestine without perforation or abscess without bleeding: Secondary | ICD-10-CM

## 2023-06-24 DIAGNOSIS — E119 Type 2 diabetes mellitus without complications: Secondary | ICD-10-CM | POA: Diagnosis not present

## 2023-06-24 DIAGNOSIS — Z6841 Body Mass Index (BMI) 40.0 and over, adult: Secondary | ICD-10-CM

## 2023-06-24 DIAGNOSIS — E66812 Obesity, class 2: Secondary | ICD-10-CM | POA: Diagnosis not present

## 2023-06-24 DIAGNOSIS — Z01818 Encounter for other preprocedural examination: Secondary | ICD-10-CM | POA: Diagnosis present

## 2023-06-24 LAB — BASIC METABOLIC PANEL WITH GFR
Anion gap: 9 (ref 5–15)
BUN: 15 mg/dL (ref 6–20)
CO2: 22 mmol/L (ref 22–32)
Calcium: 8.8 mg/dL — ABNORMAL LOW (ref 8.9–10.3)
Chloride: 106 mmol/L (ref 98–111)
Creatinine, Ser: 0.67 mg/dL (ref 0.44–1.00)
GFR, Estimated: >60 mL/min (ref >60)
Glucose, Bld: 94 mg/dL (ref 70–99)
Potassium: 3.8 mmol/L (ref 3.5–5.1)
Sodium: 137 mmol/L (ref 135–145)

## 2023-06-24 LAB — CBC WITH DIFFERENTIAL/PLATELET
Abs Immature Granulocytes: 0.01 10*3/uL (ref 0.00–0.07)
Basophils Absolute: 0 10*3/uL (ref 0.0–0.1)
Basophils Relative: 1 %
Eosinophils Absolute: 0.2 10*3/uL (ref 0.0–0.5)
Eosinophils Relative: 3 %
HCT: 43.8 % (ref 36.0–46.0)
Hemoglobin: 14 g/dL (ref 12.0–15.0)
Immature Granulocytes: 0 %
Lymphocytes Relative: 34 %
Lymphs Abs: 2.6 10*3/uL (ref 0.7–4.0)
MCH: 33.2 pg (ref 26.0–34.0)
MCHC: 32 g/dL (ref 30.0–36.0)
MCV: 103.8 fL — ABNORMAL HIGH (ref 80.0–100.0)
Monocytes Absolute: 0.4 10*3/uL (ref 0.1–1.0)
Monocytes Relative: 5 %
Neutro Abs: 4.4 10*3/uL (ref 1.7–7.7)
Neutrophils Relative %: 57 %
Platelets: 254 10*3/uL (ref 150–400)
RBC: 4.22 MIL/uL (ref 3.87–5.11)
RDW: 12.3 % (ref 11.5–15.5)
WBC: 7.6 10*3/uL (ref 4.0–10.5)
nRBC: 0 % (ref 0.0–0.2)

## 2023-06-24 LAB — TYPE AND SCREEN
ABO/RH(D): A NEG
Antibody Screen: NEGATIVE

## 2023-06-24 LAB — HEMOGLOBIN A1C
Hgb A1c MFr Bld: 4.8 % (ref 4.8–5.6)
Mean Plasma Glucose: 91.06 mg/dL

## 2023-06-24 LAB — POCT PREGNANCY, URINE: Preg Test, Ur: NEGATIVE

## 2023-06-24 NOTE — Progress Notes (Deleted)
 GI Office Note    Referring Provider: Jace Martinet, FNP Primary Care Physician:  Jace Martinet, FNP  Primary Gastroenterologist: Rolando Cliche. Mordechai Moria, DO   Chief Complaint   No chief complaint on file.   History of Present Illness   Isabella Walker is a 43 y.o. female presenting today for follow up. Last sen 03/2023. H/O IBS-C, GERD, epigastric pain, complicated diverticulitis.   Scheduled for partial colectomy next week.   EGD 04/2023: -esophageal mucosal changes c/w short-segment Barrett's esophagus, s/p bx c/w Barrett's without dysplasia -gastritis s/p bx, no h.pylori -next EGD five years.  Colonoscopy 04/2023: -nonbleeding internal hemorrhoids -diverticulosis -six 5-2mm polyps removed from desc and trv colon -one 5mm polyp removed from sigmoid -polyps mix of tubular adenomas, sessile serrated polyps, one benign hyperplastic polyp -next colonoscopy in 3 years   Medications   Current Outpatient Medications  Medication Sig Dispense Refill   ACCU-CHEK GUIDE test strip      acetaminophen  (TYLENOL ) 325 MG tablet Take 2 tablets (650 mg total) by mouth every 6 (six) hours as needed for mild pain (pain score 1-3), fever or headache (or Fever >/= 101).     levonorgestrel (MIRENA) 20 MCG/24HR IUD 1 each by Intrauterine route once.      metroNIDAZOLE  (FLAGYL ) 500 MG tablet Take 2 flagyl  500mg  tablets at 2 pm, 3pm, and 10 pm the day before surgery. 6 tablet 0   neomycin  (MYCIFRADIN ) 500 MG tablet Take 2 tablets (1,000 mg total) by mouth as directed. Take 2 neomycin  500mg  tablets at 2 pm, 3pm, and 10 pm the day before surgery. 6 tablet 0   ondansetron  (ZOFRAN ) 4 MG tablet Take 1 tablet (4 mg total) by mouth daily as needed for nausea or vomiting. 15 tablet 0   pantoprazole  (PROTONIX ) 40 MG tablet Take 1 tablet (40 mg total) by mouth 2 (two) times daily. 60 tablet 11   senna-docusate (SENOKOT-S) 8.6-50 MG tablet Take 2 tablets by mouth at bedtime. (Patient taking  differently: Take 1 tablet by mouth every other day.) 60 tablet 1   simvastatin (ZOCOR) 10 MG tablet Take 10 mg by mouth daily.     No current facility-administered medications for this visit.    Allergies   Allergies as of 06/24/2023 - Review Complete 06/10/2023  Allergen Reaction Noted   Bextra [valdecoxib] Other (See Comments) 05/01/2012     Past Medical History   Past Medical History:  Diagnosis Date   Anxiety    Bipolar 1 disorder (HCC)    Complication of anesthesia    Depression complicating pregnancy, postpartum 2012   Diabetes mellitus without complication (HCC)    GERD (gastroesophageal reflux disease)    Headache(784.0)    hx of migraines-last one few months ago.   High cholesterol    Hypertension    IBS (irritable bowel syndrome)    Migraines    PTSD (post-traumatic stress disorder)    Thyroid disease     Past Surgical History   Past Surgical History:  Procedure Laterality Date   CARPAL TUNNEL RELEASE Left 08/20/2018   Procedure: CARPAL TUNNEL RELEASE;  Surgeon: Darrin Emerald, MD;  Location: [facility];  Service: Orthopedics;  Laterality: Left;   COLONOSCOPY N/A 04/29/2023   Procedure: COLONOSCOPY;  Surgeon: Vinetta Greening, DO;  Location: [facility];  Service: Endoscopy;  Laterality: N/A;  900AM, ok RM 1/2   ESOPHAGOGASTRODUODENOSCOPY N/A 04/29/2023   Procedure: EGD (ESOPHAGOGASTRODUODENOSCOPY);  Surgeon: Vinetta Greening, DO;  Location: [facility];  Service: Endoscopy;  Laterality: N/A;   INSERTION OF MESH N/A 05/25/2012   Procedure: INSERTION OF MESH;  Surgeon: Beau Bound, MD;  Location: [facility];  Service: General;  Laterality: N/A;   ORIF KNEE DISLOCATION Right    VENTRAL HERNIA REPAIR N/A 05/25/2012   Procedure: LAPAROSCOPIC VENTRAL HERNIA;  Surgeon: Beau Bound, MD;  Location: [facility];  Service: General;  Laterality: N/A;    Past Family History   Family History  Problem Relation Age of Onset   High blood pressure Mother     Diverticulitis Mother        had colostomy   Diabetes Father    High blood pressure Father    Diverticulitis Maternal Grandmother        had to have colon surgery   Cancer Paternal Aunt        ovarian   Colon cancer Neg Hx     Past Social History   Social History   Socioeconomic History   Marital status: Married    Spouse name: Not on file   Number of children: Not on file   Years of education: Not on file   Highest education level: Not on file  Occupational History   Not on file  Tobacco Use   Smoking status: Every Day    Current packs/day: 0.00    Average packs/day: 1 pack/day for 15.0 years (15.0 ttl pk-yrs)    Types: Cigarettes    Start date: 01/31/2002    Last attempt to quit: 01/31/2017    Years since quitting: 6.3   Smokeless tobacco: Never  Vaping Use   Vaping status: Former  Substance and Sexual Activity   Alcohol use: Never   Drug use: Yes    Types: Marijuana   Sexual activity: Yes    Birth control/protection: I.U.D.  Other Topics Concern   Not on file  Social History Narrative   ** Merged History Encounter **       Social Drivers of Health   Financial Resource Strain: Not on file  Food Insecurity: Food Insecurity Present (02/15/2023)   Hunger Vital Sign    Worried About Running Out of Food in the Last Year: Sometimes true    Ran Out of Food in the Last Year: Sometimes true  Transportation Needs: No Transportation Needs (02/15/2023)   PRAPARE - Administrator, Civil Service (Medical): No    Lack of Transportation (Non-Medical): No  Physical Activity: Not on file  Stress: Not on file  Social Connections: Not on file  Intimate Partner Violence: Not At Risk (02/15/2023)   Humiliation, Afraid, Rape, and Kick questionnaire    Fear of Current or Ex-Partner: No    Emotionally Abused: No    Physically Abused: No    Sexually Abused: No    Review of Systems   General: Negative for anorexia, weight loss, fever, chills, fatigue, weakness. ENT:  Negative for hoarseness, difficulty swallowing , nasal congestion. CV: Negative for chest pain, angina, palpitations, dyspnea on exertion, peripheral edema.  Respiratory: Negative for dyspnea at rest, dyspnea on exertion, cough, sputum, wheezing.  GI: See history of present illness. GU:  Negative for dysuria, hematuria, urinary incontinence, urinary frequency, nocturnal urination.  Endo: Negative for unusual weight change.     Physical Exam   There were no vitals taken for this visit.   General: Well-nourished, well-developed in no acute distress.  Eyes: No icterus. Mouth: Oropharyngeal mucosa moist and pink   Lungs: Clear to auscultation bilaterally.  Heart: Regular rate and rhythm, no murmurs rubs or gallops.  Abdomen: Bowel sounds are normal, nontender, nondistended, no hepatosplenomegaly or masses,  no abdominal bruits or hernia , no rebound or guarding.  Rectal: not performed Extremities: No lower extremity edema. No clubbing or deformities. Neuro: Alert and oriented x 4   Skin: Warm and dry, no jaundice.   Psych: Alert and cooperative, normal mood and affect.  Labs   *** Imaging Studies   No results found.  Assessment/Plan:        Trudie Fuse. Harles Lied, MHS, PA-C [facility]

## 2023-06-25 ENCOUNTER — Encounter: Payer: Self-pay | Admitting: Gastroenterology

## 2023-06-27 ENCOUNTER — Inpatient Hospital Stay (HOSPITAL_COMMUNITY)
Admission: RE | Admit: 2023-06-27 | Discharge: 2023-07-01 | DRG: 331 | Disposition: A | Discharge status: Home / Self Care | Attending: General Surgery | Admitting: General Surgery

## 2023-06-27 ENCOUNTER — Other Ambulatory Visit: Payer: Self-pay

## 2023-06-27 ENCOUNTER — Encounter (HOSPITAL_COMMUNITY): Payer: Self-pay | Admitting: General Surgery

## 2023-06-27 ENCOUNTER — Encounter (HOSPITAL_COMMUNITY)
Admission: RE | Disposition: A | Discharge status: Home / Self Care | Payer: Self-pay | Source: Home / Self Care | Attending: General Surgery

## 2023-06-27 ENCOUNTER — Inpatient Hospital Stay (HOSPITAL_COMMUNITY): Payer: Self-pay | Admitting: Anesthesiology

## 2023-06-27 DIAGNOSIS — I10 Essential (primary) hypertension: Secondary | ICD-10-CM | POA: Diagnosis present

## 2023-06-27 DIAGNOSIS — Z886 Allergy status to analgesic agent status: Secondary | ICD-10-CM | POA: Diagnosis not present

## 2023-06-27 DIAGNOSIS — Z01818 Encounter for other preprocedural examination: Secondary | ICD-10-CM

## 2023-06-27 DIAGNOSIS — E7800 Pure hypercholesterolemia, unspecified: Secondary | ICD-10-CM | POA: Diagnosis present

## 2023-06-27 DIAGNOSIS — K5732 Diverticulitis of large intestine without perforation or abscess without bleeding: Secondary | ICD-10-CM

## 2023-06-27 DIAGNOSIS — Z8041 Family history of malignant neoplasm of ovary: Secondary | ICD-10-CM | POA: Diagnosis not present

## 2023-06-27 DIAGNOSIS — X58XXXA Exposure to other specified factors, initial encounter: Secondary | ICD-10-CM | POA: Diagnosis present

## 2023-06-27 DIAGNOSIS — Z833 Family history of diabetes mellitus: Secondary | ICD-10-CM | POA: Diagnosis not present

## 2023-06-27 DIAGNOSIS — Z6841 Body Mass Index (BMI) 40.0 and over, adult: Secondary | ICD-10-CM | POA: Diagnosis not present

## 2023-06-27 DIAGNOSIS — E66813 Obesity, class 3: Secondary | ICD-10-CM | POA: Diagnosis present

## 2023-06-27 DIAGNOSIS — F4310 Post-traumatic stress disorder, unspecified: Secondary | ICD-10-CM | POA: Diagnosis present

## 2023-06-27 DIAGNOSIS — S0501XA Injury of conjunctiva and corneal abrasion without foreign body, right eye, initial encounter: Secondary | ICD-10-CM | POA: Diagnosis present

## 2023-06-27 DIAGNOSIS — F319 Bipolar disorder, unspecified: Secondary | ICD-10-CM | POA: Diagnosis present

## 2023-06-27 DIAGNOSIS — K5720 Diverticulitis of large intestine with perforation and abscess without bleeding: Secondary | ICD-10-CM | POA: Diagnosis present

## 2023-06-27 DIAGNOSIS — E119 Type 2 diabetes mellitus without complications: Secondary | ICD-10-CM | POA: Diagnosis present

## 2023-06-27 DIAGNOSIS — K219 Gastro-esophageal reflux disease without esophagitis: Secondary | ICD-10-CM | POA: Diagnosis present

## 2023-06-27 DIAGNOSIS — E66812 Obesity, class 2: Principal | ICD-10-CM

## 2023-06-27 DIAGNOSIS — F17210 Nicotine dependence, cigarettes, uncomplicated: Secondary | ICD-10-CM | POA: Diagnosis present

## 2023-06-27 HISTORY — PX: LAPAROSCOPIC LYSIS OF ADHESIONS: SHX5905

## 2023-06-27 LAB — TYPE AND SCREEN
ABO/RH(D): A NEG
Antibody Screen: NEGATIVE

## 2023-06-27 LAB — GLUCOSE, CAPILLARY
Glucose-Capillary: 129 mg/dL — ABNORMAL HIGH (ref 70–99)
Glucose-Capillary: 175 mg/dL — ABNORMAL HIGH (ref 70–99)

## 2023-06-27 SURGERY — COLECTOMY, PARTIAL, ROBOT-ASSISTED, LAPAROSCOPIC
Anesthesia: General | Site: Abdomen

## 2023-06-27 MED ORDER — MORPHINE SULFATE (PF) 2 MG/ML IV SOLN
INTRAVENOUS | Status: AC
  Filled 2023-06-27: qty 1

## 2023-06-27 MED ORDER — FENTANYL CITRATE (PF) 100 MCG/2ML IJ SOLN
INTRAMUSCULAR | Status: AC
Start: 2023-06-27 — End: 2023-06-27
  Filled 2023-06-27: qty 2

## 2023-06-27 MED ORDER — HEPARIN SODIUM (PORCINE) 5000 UNIT/ML IJ SOLN
5000.0000 [IU] | Freq: Once | INTRAMUSCULAR | Status: AC
  Administered 2023-06-27: 5000 [IU] via SUBCUTANEOUS
  Filled 2023-06-27: qty 1

## 2023-06-27 MED ORDER — ACETAMINOPHEN 500 MG PO TABS
1000.0000 mg | ORAL_TABLET | Freq: Four times a day (QID) | ORAL | Status: DC
  Administered 2023-06-27 – 2023-07-01 (×14): 1000 mg via ORAL
  Filled 2023-06-27 (×16): qty 2

## 2023-06-27 MED ORDER — MIDAZOLAM HCL 2 MG/2ML IJ SOLN
INTRAMUSCULAR | Status: AC
Start: 2023-06-27 — End: 2023-06-27
  Filled 2023-06-27: qty 2

## 2023-06-27 MED ORDER — ACETAMINOPHEN 500 MG PO TABS
1000.0000 mg | ORAL_TABLET | ORAL | Status: AC
  Administered 2023-06-27: 1000 mg via ORAL
  Filled 2023-06-27: qty 2

## 2023-06-27 MED ORDER — SUGAMMADEX SODIUM 200 MG/2ML IV SOLN
INTRAVENOUS | Status: DC | PRN
  Administered 2023-06-27: 208.6 mg via INTRAVENOUS

## 2023-06-27 MED ORDER — SODIUM CHLORIDE 0.9 % IV SOLN
2.0000 g | Freq: Two times a day (BID) | INTRAVENOUS | Status: AC
  Administered 2023-06-27 – 2023-06-28 (×3): 2 g via INTRAVENOUS
  Filled 2023-06-27 (×3): qty 2

## 2023-06-27 MED ORDER — CHLORHEXIDINE GLUCONATE 0.12 % MT SOLN
15.0000 mL | Freq: Once | OROMUCOSAL | Status: AC
  Administered 2023-06-27: 15 mL via OROMUCOSAL

## 2023-06-27 MED ORDER — PROPOFOL 10 MG/ML IV BOLUS
INTRAVENOUS | Status: DC | PRN
  Administered 2023-06-27: 150 mg via INTRAVENOUS

## 2023-06-27 MED ORDER — ONDANSETRON HCL 4 MG/2ML IJ SOLN
INTRAMUSCULAR | Status: DC | PRN
Start: 2023-06-27 — End: 2023-06-27
  Administered 2023-06-27: 4 mg via INTRAVENOUS

## 2023-06-27 MED ORDER — PHENYLEPHRINE 80 MCG/ML (10ML) SYRINGE FOR IV PUSH (FOR BLOOD PRESSURE SUPPORT)
PREFILLED_SYRINGE | INTRAVENOUS | Status: AC
  Filled 2023-06-27: qty 20

## 2023-06-27 MED ORDER — ONDANSETRON HCL 4 MG/2ML IJ SOLN
INTRAMUSCULAR | Status: AC
Start: 2023-06-27 — End: 2023-06-27
  Filled 2023-06-27: qty 4

## 2023-06-27 MED ORDER — IBUPROFEN 600 MG PO TABS: 600.0000 mg | ORAL_TABLET | Freq: Four times a day (QID) | ORAL | Status: DC | PRN

## 2023-06-27 MED ORDER — SACCHAROMYCES BOULARDII 250 MG PO CAPS
250.0000 mg | ORAL_CAPSULE | Freq: Two times a day (BID) | ORAL | Status: DC
  Administered 2023-06-27 – 2023-07-01 (×8): 250 mg via ORAL
  Filled 2023-06-27 (×8): qty 1

## 2023-06-27 MED ORDER — 0.9 % SODIUM CHLORIDE (POUR BTL) OPTIME
TOPICAL | Status: DC | PRN
  Administered 2023-06-27 (×3): 1000 mL

## 2023-06-27 MED ORDER — SIMVASTATIN 20 MG PO TABS
10.0000 mg | ORAL_TABLET | Freq: Every day | ORAL | Status: DC
  Administered 2023-06-27 – 2023-06-30 (×4): 10 mg via ORAL
  Filled 2023-06-27 (×4): qty 1

## 2023-06-27 MED ORDER — STERILE WATER FOR IRRIGATION IR SOLN
Status: DC | PRN
  Administered 2023-06-27: 500 mL

## 2023-06-27 MED ORDER — SUCCINYLCHOLINE CHLORIDE 200 MG/10ML IV SOSY
PREFILLED_SYRINGE | INTRAVENOUS | Status: DC | PRN
Start: 2023-06-27 — End: 2023-06-27
  Administered 2023-06-27: 140 mg via INTRAVENOUS

## 2023-06-27 MED ORDER — DIPHENHYDRAMINE HCL 12.5 MG/5ML PO ELIX
12.5000 mg | ORAL_SOLUTION | Freq: Four times a day (QID) | ORAL | Status: DC | PRN
  Administered 2023-06-29: 12.5 mg via ORAL
  Filled 2023-06-27: qty 5

## 2023-06-27 MED ORDER — ENSURE PRE-SURGERY PO LIQD
296.0000 mL | Freq: Once | ORAL | Status: DC
  Filled 2023-06-27: qty 296

## 2023-06-27 MED ORDER — CHLORHEXIDINE GLUCONATE CLOTH 2 % EX PADS
6.0000 | MEDICATED_PAD | Freq: Once | CUTANEOUS | Status: DC
  Administered 2023-06-27: 6 via TOPICAL

## 2023-06-27 MED ORDER — LACTATED RINGERS IV SOLN: INTRAVENOUS | Status: AC

## 2023-06-27 MED ORDER — OXYCODONE HCL 5 MG/5ML PO SOLN: 5.0000 mg | Freq: Once | ORAL | Status: DC | PRN

## 2023-06-27 MED ORDER — LIDOCAINE 2% (20 MG/ML) 5 ML SYRINGE
INTRAMUSCULAR | Status: DC | PRN
  Administered 2023-06-27: 60 mg via INTRAVENOUS

## 2023-06-27 MED ORDER — TETRACAINE HCL 0.5 % OP SOLN
1.0000 [drp] | OPHTHALMIC | Status: DC | PRN
  Administered 2023-06-27: 2 [drp] via OPHTHALMIC

## 2023-06-27 MED ORDER — LACTATED RINGERS IV SOLN: INTRAVENOUS | Status: DC

## 2023-06-27 MED ORDER — PROPOFOL 500 MG/50ML IV EMUL
INTRAVENOUS | Status: DC | PRN
  Administered 2023-06-27: 75 ug/kg/min via INTRAVENOUS

## 2023-06-27 MED ORDER — CHLORHEXIDINE GLUCONATE CLOTH 2 % EX PADS
6.0000 | MEDICATED_PAD | Freq: Once | CUTANEOUS | Status: AC
  Administered 2023-06-27: 6 via TOPICAL

## 2023-06-27 MED ORDER — ONDANSETRON HCL 4 MG PO TABS: 4.0000 mg | ORAL_TABLET | Freq: Four times a day (QID) | ORAL | Status: DC | PRN

## 2023-06-27 MED ORDER — SIMETHICONE 80 MG PO CHEW
40.0000 mg | CHEWABLE_TABLET | Freq: Four times a day (QID) | ORAL | Status: DC | PRN
  Administered 2023-06-29 (×2): 40 mg via ORAL
  Filled 2023-06-27 (×2): qty 1

## 2023-06-27 MED ORDER — ALVIMOPAN 12 MG PO CAPS
12.0000 mg | ORAL_CAPSULE | Freq: Two times a day (BID) | ORAL | Status: DC
  Administered 2023-06-28 – 2023-06-30 (×5): 12 mg via ORAL
  Filled 2023-06-27 (×5): qty 1

## 2023-06-27 MED ORDER — OXYCODONE HCL 5 MG PO TABS
5.0000 mg | ORAL_TABLET | ORAL | Status: DC | PRN
  Administered 2023-06-28 – 2023-06-30 (×7): 10 mg via ORAL
  Administered 2023-06-30: 5 mg via ORAL
  Administered 2023-06-30 (×2): 10 mg via ORAL
  Administered 2023-07-01: 5 mg via ORAL
  Filled 2023-06-27 (×5): qty 2
  Filled 2023-06-27: qty 1
  Filled 2023-06-27 (×3): qty 2
  Filled 2023-06-27: qty 1
  Filled 2023-06-27: qty 2
  Filled 2023-06-27: qty 1

## 2023-06-27 MED ORDER — FENTANYL CITRATE (PF) 100 MCG/2ML IJ SOLN
INTRAMUSCULAR | Status: AC
  Filled 2023-06-27: qty 2

## 2023-06-27 MED ORDER — ALVIMOPAN 12 MG PO CAPS
12.0000 mg | ORAL_CAPSULE | ORAL | Status: AC
  Administered 2023-06-27: 12 mg via ORAL
  Filled 2023-06-27: qty 1

## 2023-06-27 MED ORDER — PHENYLEPHRINE HCL-NACL 20-0.9 MG/250ML-% IV SOLN
INTRAVENOUS | Status: AC
Start: 2023-06-27 — End: 2023-06-27
  Filled 2023-06-27: qty 250

## 2023-06-27 MED ORDER — DEXAMETHASONE SODIUM PHOSPHATE 10 MG/ML IJ SOLN
INTRAMUSCULAR | Status: AC
  Filled 2023-06-27: qty 2

## 2023-06-27 MED ORDER — FENTANYL CITRATE (PF) 250 MCG/5ML IJ SOLN
INTRAMUSCULAR | Status: DC | PRN
  Administered 2023-06-27: 50 ug via INTRAVENOUS
  Administered 2023-06-27: 100 ug via INTRAVENOUS
  Administered 2023-06-27: 50 ug via INTRAVENOUS
  Administered 2023-06-27: 100 ug via INTRAVENOUS

## 2023-06-27 MED ORDER — MELATONIN 3 MG PO TABS
3.0000 mg | ORAL_TABLET | Freq: Every evening | ORAL | Status: AC | PRN
Start: 2023-06-27 — End: ?
  Administered 2023-06-30: 3 mg via ORAL
  Filled 2023-06-27: qty 1

## 2023-06-27 MED ORDER — METOPROLOL TARTRATE 5 MG/5ML IV SOLN: 5.0000 mg | Freq: Four times a day (QID) | INTRAVENOUS | Status: DC | PRN

## 2023-06-27 MED ORDER — ENSURE PRE-SURGERY PO LIQD
592.0000 mL | Freq: Once | ORAL | Status: DC
  Filled 2023-06-27: qty 592

## 2023-06-27 MED ORDER — SODIUM CHLORIDE 0.9 % IV SOLN
2.0000 g | INTRAVENOUS | Status: AC
  Administered 2023-06-27: 2 g via INTRAVENOUS
  Filled 2023-06-27: qty 2

## 2023-06-27 MED ORDER — SUCCINYLCHOLINE CHLORIDE 200 MG/10ML IV SOSY
PREFILLED_SYRINGE | INTRAVENOUS | Status: AC
  Filled 2023-06-27: qty 10

## 2023-06-27 MED ORDER — FENTANYL CITRATE PF 50 MCG/ML IJ SOSY
25.0000 ug | PREFILLED_SYRINGE | INTRAMUSCULAR | Status: DC | PRN
  Administered 2023-06-27 (×2): 50 ug via INTRAVENOUS
  Filled 2023-06-27 (×2): qty 1

## 2023-06-27 MED ORDER — ONDANSETRON HCL 4 MG/2ML IJ SOLN: 4.0000 mg | Freq: Once | INTRAMUSCULAR | Status: DC | PRN

## 2023-06-27 MED ORDER — PHENYLEPHRINE 80 MCG/ML (10ML) SYRINGE FOR IV PUSH (FOR BLOOD PRESSURE SUPPORT)
PREFILLED_SYRINGE | INTRAVENOUS | Status: DC | PRN
  Administered 2023-06-27: 200 ug via INTRAVENOUS
  Administered 2023-06-27: 160 ug via INTRAVENOUS

## 2023-06-27 MED ORDER — ROCURONIUM BROMIDE 10 MG/ML (PF) SYRINGE
PREFILLED_SYRINGE | INTRAVENOUS | Status: DC | PRN
  Administered 2023-06-27 (×2): 100 mg via INTRAVENOUS

## 2023-06-27 MED ORDER — LIDOCAINE 2% (20 MG/ML) 5 ML SYRINGE
INTRAMUSCULAR | Status: AC
  Filled 2023-06-27: qty 5

## 2023-06-27 MED ORDER — MORPHINE SULFATE (PF) 2 MG/ML IV SOLN
2.0000 mg | INTRAVENOUS | Status: DC | PRN
  Administered 2023-06-27 – 2023-06-29 (×7): 2 mg via INTRAVENOUS
  Filled 2023-06-27 (×6): qty 1

## 2023-06-27 MED ORDER — ONDANSETRON HCL 4 MG/2ML IJ SOLN
4.0000 mg | Freq: Four times a day (QID) | INTRAMUSCULAR | Status: DC | PRN
Start: 2023-06-27 — End: 2023-07-01

## 2023-06-27 MED ORDER — ORAL CARE MOUTH RINSE: 15.0000 mL | Freq: Once | OROMUCOSAL | Status: AC

## 2023-06-27 MED ORDER — BUPIVACAINE HCL (PF) 0.5 % IJ SOLN
INTRAMUSCULAR | Status: AC
  Filled 2023-06-27: qty 30

## 2023-06-27 MED ORDER — MIDAZOLAM HCL 2 MG/2ML IJ SOLN
INTRAMUSCULAR | Status: DC | PRN
  Administered 2023-06-27: 2 mg via INTRAVENOUS

## 2023-06-27 MED ORDER — HEPARIN SODIUM (PORCINE) 5000 UNIT/ML IJ SOLN
5000.0000 [IU] | Freq: Three times a day (TID) | INTRAMUSCULAR | Status: DC
  Administered 2023-06-28 – 2023-07-01 (×9): 5000 [IU] via SUBCUTANEOUS
  Filled 2023-06-27 (×9): qty 1

## 2023-06-27 MED ORDER — DIPHENHYDRAMINE HCL 50 MG/ML IJ SOLN: 12.5000 mg | Freq: Four times a day (QID) | INTRAMUSCULAR | Status: DC | PRN

## 2023-06-27 MED ORDER — PHENYLEPHRINE HCL-NACL 20-0.9 MG/250ML-% IV SOLN
INTRAVENOUS | Status: DC | PRN
  Administered 2023-06-27: 25 ug/min via INTRAVENOUS

## 2023-06-27 MED ORDER — KETOROLAC TROMETHAMINE 0.5 % OP SOLN
1.0000 [drp] | Freq: Four times a day (QID) | OPHTHALMIC | Status: DC
  Administered 2023-06-27 – 2023-07-01 (×15): 1 [drp] via OPHTHALMIC
  Filled 2023-06-27: qty 3

## 2023-06-27 MED ORDER — OXYCODONE HCL 5 MG PO TABS: 5.0000 mg | ORAL_TABLET | Freq: Once | ORAL | Status: DC | PRN

## 2023-06-27 MED ORDER — PROPOFOL 500 MG/50ML IV EMUL
INTRAVENOUS | Status: AC
  Filled 2023-06-27: qty 150

## 2023-06-27 MED ORDER — SEVOFLURANE IN SOLN
RESPIRATORY_TRACT | Status: AC
  Filled 2023-06-27: qty 500

## 2023-06-27 MED ORDER — BUPIVACAINE HCL 0.5 % IJ SOLN
INTRAMUSCULAR | Status: DC | PRN
  Administered 2023-06-27: 30 mL

## 2023-06-27 MED ORDER — DEXAMETHASONE SODIUM PHOSPHATE 10 MG/ML IJ SOLN
INTRAMUSCULAR | Status: DC | PRN
  Administered 2023-06-27: 10 mg via INTRAVENOUS

## 2023-06-27 MED ORDER — PANTOPRAZOLE SODIUM 40 MG PO TBEC
40.0000 mg | DELAYED_RELEASE_TABLET | Freq: Two times a day (BID) | ORAL | Status: DC
  Administered 2023-06-27 – 2023-07-01 (×8): 40 mg via ORAL
  Filled 2023-06-27 (×8): qty 1

## 2023-06-27 MED ORDER — ENSURE SURGERY PO LIQD
237.0000 mL | Freq: Two times a day (BID) | ORAL | Status: DC
  Administered 2023-07-01: 237 mL via ORAL
  Filled 2023-06-27 (×10): qty 237

## 2023-06-27 MED ORDER — ALUM & MAG HYDROXIDE-SIMETH 200-200-20 MG/5ML PO SUSP: 30.0000 mL | Freq: Four times a day (QID) | ORAL | Status: DC | PRN

## 2023-06-27 SURGICAL SUPPLY — 56 items
COVER LIGHT HANDLE (MISCELLANEOUS) IMPLANT
COVER TIP SHEARS 8 DVNC (MISCELLANEOUS) ×1 IMPLANT
DERMABOND ADVANCED .7 DNX12 (GAUZE/BANDAGES/DRESSINGS) ×1 IMPLANT
DRAPE ARM DVNC X/XI (DISPOSABLE) ×4 IMPLANT
DRAPE COLUMN DVNC XI (DISPOSABLE) ×1 IMPLANT
DRSG OPSITE POSTOP 4X8 (GAUZE/BANDAGES/DRESSINGS) IMPLANT
DRSG TEGADERM 2-3/8X2-3/4 SM (GAUZE/BANDAGES/DRESSINGS) ×4 IMPLANT
ELECTRODE REM PT RTRN 9FT ADLT (ELECTROSURGICAL) ×1 IMPLANT
FORCEPS BPLR 8 MD DVNC XI (FORCEP) ×1 IMPLANT
GAUZE SPONGE 2X2 STRL 8-PLY (GAUZE/BANDAGES/DRESSINGS) ×4 IMPLANT
GAUZE SPONGE 4X4 12PLY STRL (GAUZE/BANDAGES/DRESSINGS) IMPLANT
GAUZE SPONGE 4X4 16PLY XRAY LF (GAUZE/BANDAGES/DRESSINGS) ×1 IMPLANT
GLOVE BIO SURGEON STRL SZ 6.5 (GLOVE) ×3 IMPLANT
GLOVE BIO SURGEON STRL SZ7 (GLOVE) ×5 IMPLANT
GLOVE BIOGEL PI IND STRL 6.5 (GLOVE) ×3 IMPLANT
GLOVE BIOGEL PI IND STRL 7.0 (GLOVE) ×6 IMPLANT
GOWN STRL REUS W/TWL LRG LVL3 (GOWN DISPOSABLE) ×6 IMPLANT
GRASPER SUT TROCAR 14GX15 (MISCELLANEOUS) IMPLANT
GRASPER TIP-UP FEN DVNC XI (INSTRUMENTS) ×1 IMPLANT
IRRIGATOR SUCT 8 DISP DVNC XI (IRRIGATION / IRRIGATOR) ×1 IMPLANT
KIT PINK PAD W/HEAD ARM REST (MISCELLANEOUS) ×1 IMPLANT
KIT TURNOVER CYSTO (KITS) ×1 IMPLANT
LIGASURE IMPACT 36 18CM CVD LR (INSTRUMENTS) IMPLANT
MANIFOLD NEPTUNE II (INSTRUMENTS) ×1 IMPLANT
NDL HYPO 21X1.5 SAFETY (NEEDLE) ×1 IMPLANT
NDL INSUFFLATION 14GA 120MM (NEEDLE) ×1 IMPLANT
NEEDLE HYPO 21X1.5 SAFETY (NEEDLE) ×1 IMPLANT
NEEDLE INSUFFLATION 14GA 120MM (NEEDLE) ×1 IMPLANT
OBTURATOR OPTICALSTD 8 DVNC (TROCAR) ×1 IMPLANT
PACK COLON (CUSTOM PROCEDURE TRAY) ×1 IMPLANT
PENCIL HANDSWITCHING (ELECTRODE) ×1 IMPLANT
POSITIONER HEAD 8X9X4 ADT (SOFTGOODS) ×1 IMPLANT
RELOAD STAPLE 60 2.5 WHT DVNC (STAPLE) ×1 IMPLANT
RELOAD STAPLER 2.5X60 WHT DVNC (STAPLE) ×2 IMPLANT
RETRACTOR WOUND ALXS 18CM MED (MISCELLANEOUS) IMPLANT
SCISSORS MNPLR CVD DVNC XI (INSTRUMENTS) ×1 IMPLANT
SEAL UNIV 5-12 XI (MISCELLANEOUS) ×4 IMPLANT
SEALER VESSEL EXT DVNC XI (MISCELLANEOUS) ×1 IMPLANT
SOL ANTI FOG 6CC (MISCELLANEOUS) ×1 IMPLANT
SPONGE T-LAP 18X18 ~~LOC~~+RFID (SPONGE) ×1 IMPLANT
STAPLER 60 SUREFORM DVNC (STAPLE) ×1 IMPLANT
STAPLER GUN LINEAR PROX 60 (STAPLE) ×1 IMPLANT
STAPLER PROXIMATE 75MM BLUE (STAPLE) IMPLANT
STAPLER VISISTAT (STAPLE) ×1 IMPLANT
SUT CHROMIC 2 0 SH (SUTURE) IMPLANT
SUT PDS AB 0 CTX 60 (SUTURE) IMPLANT
SUT PDS AB CT VIOLET #0 27IN (SUTURE) ×2 IMPLANT
SUT SILK 3 0 SH CR/8 (SUTURE) ×1 IMPLANT
SUT VIC AB 3-0 SH 27X BRD (SUTURE) IMPLANT
SUT VICRYL 0 UR6 27IN ABS (SUTURE) IMPLANT
SYR 30ML LL (SYRINGE) ×1 IMPLANT
TRAY FOLEY MTR SLVR 16FR STAT (SET/KITS/TRAYS/PACK) ×1 IMPLANT
TROCAR Z-THREAD FIOS 5X100MM (TROCAR) IMPLANT
TUBING INSUFFLATION (TUBING) ×1 IMPLANT
WATER STERILE IRR 500ML POUR (IV SOLUTION) ×1 IMPLANT
YANKAUER SUCT BULB TIP 10FT TU (MISCELLANEOUS) ×1 IMPLANT

## 2023-06-27 NOTE — Op Note (Signed)
 [facility] Operative Note  06/27/23  Preoperative Diagnosis: Sigmoid diverticulitis    Postoperative Diagnosis: Same   Procedure(s) Performed: Robotic assisted laparoscopic sigmoid colectomy with extracorporeal side to side anastomosis, lysis of adhesion from the anterior abdominal wall to the mesh    Surgeon: Dixon Fredrickson. Collene Dawson, MD   Assistants: Dr. Alanda Allegra, MD   Anesthesia: General endotracheal   Anesthesiologist: Coretha Dew, MD    Specimens:  Sigmoid colon   Estimated Blood Loss: 100cc   Blood Replacement: None    Complications: None   Wound Class: Clean contaminated    Operative Indications:  [name] is a 43 yo who comes in with recent episode of sigmoid diverticulitis with microperforation. She was managed non operatively and this episode was a recurrent episode. She had a colonoscopy showing no other concerning pathology, and we discussed her options. She had multiple family members needing an end colostomy in the past for perforated diverticulitis and she wanted to avoid this.  We discussed risk of bleeding, infection, anastomotic leak, needing a colostomy, injury to ureter or other organ.   Findings: Upon entering the lower abdomen (organ space), I encountered a phlegmon involving the  inflamed and thickened sigmoid colon and the anterior left lower abdomen/ pelvic abdominal wall.   Procedure: The patient was taken to the operating room and placed supine. General endotracheal anesthesia was induced. Intravenous antibiotics were administered per protocol.  An orogastric tube positioned to decompress the stomach. She was then placed in lithotomy with all pressure points padded.  A foley was placed. The abdomen and perineal region/ anal region were prepped and draped in the usual sterile fashion.   At Palmer's point a stab incision was made. The Veress needle was placed and the saline drop test confirmed. A 5 mm trocar placed in the same  location. There was no signs of any injury to the underlying structures.   Additional trocars were placed in the right abdomen in a linear tangential configuration to allow for access to the sigmoid colon region with the Robot.  A 12 mm trocar was placed in the right lower abdomen, and three 8 mm trocars were placed evenly spaced up the right abdomen with a slight curve due to the patient's abdominal cavity doming in the middle.  All trocars were placed under direct visualization.  There was omentum adherent to the midline mesh she had previously placed. The patient was placed in slight left side up and head down position.    The robot platform was docked (from patient's left).  A vessel sealer was placed in the left lower 12 mm trocar, the camera in next most superior, the fenestrated bipolar and then the tips up grasper in the most cranial trocar.  With care I took down the omentum from the mesh, lysing these adhesions carefully and having to leave some omentum adherent to the mesh where it had incorporated.    Once the adhesions were freed up, I could identify the sigmoid colon. Upon entering the lower abdomen (organ space), I encountered a phlegmon involving the  inflamed and thickened sigmoid colon and the anterior left lower abdomen/ pelvic abdominal wall.  The descending colon was healthy and soft, and there was redundant distal sigmoid colon past this area that was soft and healthy without diverticula.  With great care, I carefully dissected out the sigmoid colon from the phlegmon ensuring that I was not anywhere close to the bladder. I took this down with a combination of  suction blunt dissection, vessel sealer dissection and dissection with the scissors. As I freed up the inflamed sigmoid colon from the phlegmon, I was able to identify the uterus and the left fallopian tube and ovary. I identified the left ureter. With care I took down the white line of Toldt from the pelvic brim up the descending  colon with the scissors.  The mesentery was long and floppy.    I found the point of transection on the distal colon and took this with a white Sureform staple line after clearing the colon with the Vessel sealer and creating a mesenteric defect at the mesenteric edge.  I found the proximal point of transection and again cleared the mesenteric edge of fat and vessels, and placed the white Sureform staple load.  Both staple lines were closed and healthy colon remained. The mesentery was taken high close to the colon to ensure no injury to any retroperitoneal structures (I had previously identified).  The proximal end of the  colon reached into the pelvis, and the distal end of the colon reached to the umbilical region.  There were no signs of bleeding.  The robot was undocked. The camera was used to remove the 12 mm trocar and a PMI with 0 Vicryl was used to close the defect. A lower midline incision was made avoiding the mesh at the umbilicus.  The remaining trocars were removed, and a wound protector was placed. The robot equipment was passed off the field.  The two ends of the colon eviscerated into the wound with ease and laid side to side without any twisting or tension.  Towels were draped. The small bowel was packed with a laparotomy pad. A standard side to side anastomosis was done with a 75 mm linear cutting stapler. The common colotomy was closed with a TA 60. Two crotch sutures of 3-0 Silk were placed. The staple line was oversewn with 3-0 Silk Lembert suture and epiploic fat was placed over the staple line to protect it. The colon anastomosis laid in the abdomen without any tension. The laparotomy pad and towels were removed. The wound protector was removed.   New drapes and closing equipment were used. The entire team changed gown and gloves as per protocol. The lower midline was closed with 0 PDS suture. Hemostasis was achieved. The wound was irritated. Marcaine  was injected into the extraction  site  and port site. The deep space was closed with 3-0 Vicyrl interrupted suture. The skin was closed with staples and dressings  were placed.   Dr. Larrie Po was assisting throughout the procedure and was present for the critical portions of the case including the anastomosis and closure.   Final inspection revealed acceptable hemostasis. All counts were correct at the end of the case. The patient was awakened from anesthesia and extubated without complication.  The patient went to the PACU in stable condition.   Deena Farrier, MD [facility] [address] [place], [place] [address] [phone] (office)

## 2023-06-27 NOTE — Interval H&P Note (Signed)
 History and Physical Interval Note:  06/27/2023 7:30 AM  Isabella Walker  has presented today for surgery, with the diagnosis of SIGMOID DIVERTICULITIS.  The various methods of treatment have been discussed with the patient and family. After consideration of risks, benefits and other options for treatment, the patient has consented to  Procedure(s) with comments: COLECTOMY, PARTIAL, ROBOT-ASSISTED, LAPAROSCOPIC (N/A) - pt to arrive at 6:15 as a surgical intervention.  The patient's history has been reviewed, patient examined, no change in status, stable for surgery.  I have reviewed the patient's chart and labs.  Questions were answered to the patient's satisfaction.     Awilda Bogus

## 2023-06-27 NOTE — Transfer of Care (Signed)
 Immediate Anesthesia Transfer of Care Note  Patient: Isabella Walker  Procedure(s) Performed: COLECTOMY, PARTIAL, ROBOT-ASSISTED, LAPAROSCOPIC (Abdomen) LYSIS, ADHESIONS, LAPAROSCOPIC (Abdomen)  Patient Location: PACU  Anesthesia Type:General  Level of Consciousness: drowsy  Airway & Oxygen Therapy: Patient Spontanous Breathing and Patient connected to face mask oxygen  Post-op Assessment: Report given to RN and Post -op Vital signs reviewed and stable  Post vital signs: Reviewed and stable  Last Vitals:  Vitals Value Taken Time  BP 110/64   Temp 98   Pulse 82   Resp 16   SpO2 98     Last Pain:  Vitals:   06/27/23 0649  TempSrc: Oral  PainSc:       Patients Stated Pain Goal: 7 (06/27/23 9147)  Complications: No notable events documented.

## 2023-06-27 NOTE — Anesthesia Preprocedure Evaluation (Signed)
 Anesthesia Evaluation  Patient identified by MRN, date of birth, ID band Patient awake    Reviewed: Allergy & Precautions, H&P , NPO status , Patient's Chart, lab work & pertinent test results, reviewed documented beta blocker date and time   History of Anesthesia Complications (+) history of anesthetic complications  Airway Mallampati: II  TM Distance: >3 FB Neck ROM: full    Dental no notable dental hx.    Pulmonary neg pulmonary ROS, Current Smoker   Pulmonary exam normal breath sounds clear to auscultation       Cardiovascular Exercise Tolerance: Good hypertension,  Rhythm:regular Rate:Normal     Neuro/Psych  Headaches PSYCHIATRIC DISORDERS Anxiety Depression Bipolar Disorder    Neuromuscular disease    GI/Hepatic Neg liver ROS,GERD  ,,  Endo/Other  diabetes    Renal/GU negative Renal ROS  negative genitourinary   Musculoskeletal   Abdominal   Peds  Hematology negative hematology ROS (+)   Anesthesia Other Findings   Reproductive/Obstetrics negative OB ROS                             Anesthesia Physical Anesthesia Plan  ASA: 2  Anesthesia Plan: General and General ETT   Post-op Pain Management:    Induction:   PONV Risk Score and Plan: Ondansetron   Airway Management Planned:   Additional Equipment:   Intra-op Plan:   Post-operative Plan:   Informed Consent: I have reviewed the patients History and Physical, chart, labs and discussed the procedure including the risks, benefits and alternatives for the proposed anesthesia with the patient or authorized representative who has indicated his/her understanding and acceptance.     Dental Advisory Given  Plan Discussed with: CRNA  Anesthesia Plan Comments:        Anesthesia Quick Evaluation

## 2023-06-27 NOTE — Plan of Care (Signed)

## 2023-06-27 NOTE — Anesthesia Procedure Notes (Signed)
 Procedure Name: Intubation Date/Time: 06/27/2023 7:46 AM  Performed by: Robert Chimes, CRNAPre-anesthesia Checklist: Patient identified, Emergency Drugs available, Suction available and Patient being monitored Patient Re-evaluated:Patient Re-evaluated prior to induction Oxygen Delivery Method: Circle system utilized Preoxygenation: Pre-oxygenation with 100% oxygen Induction Type: IV induction Ventilation: Mask ventilation without difficulty Laryngoscope Size: Mac and 3 Grade View: Grade I Tube type: Oral Tube size: 7.0 mm Number of attempts: 1 Airway Equipment and Method: Stylet Placement Confirmation: ETT inserted through vocal cords under direct vision, positive ETCO2 and breath sounds checked- equal and bilateral Secured at: 21 cm Tube secured with: Tape Dental Injury: Teeth and Oropharynx as per pre-operative assessment

## 2023-06-27 NOTE — OR Nursing (Signed)
 Called to update spouse- Mont Antis and let him know that we were able to proceed with robot portion of the case and that everything was going well.

## 2023-06-27 NOTE — Progress Notes (Signed)
 [facility]  Husband was updated on the phone.  PRN For pain Scheduled tylenol  Binder Monitor for 24 hrs Clear diet  Entereg  Foley overnight Labs in AM Cefotetan for 3 doses given the phlegmon  Dr. Larrie Po will see this weekend.   Deena Farrier, MD [facility] [address] [place], [place] [address] [phone] (office)

## 2023-06-28 LAB — BASIC METABOLIC PANEL WITH GFR
Anion gap: 7 (ref 5–15)
BUN: 9 mg/dL (ref 6–20)
CO2: 22 mmol/L (ref 22–32)
Calcium: 8.3 mg/dL — ABNORMAL LOW (ref 8.9–10.3)
Chloride: 106 mmol/L (ref 98–111)
Creatinine, Ser: 0.67 mg/dL (ref 0.44–1.00)
GFR, Estimated: >60 mL/min (ref >60)
Glucose, Bld: 93 mg/dL (ref 70–99)
Potassium: 3.7 mmol/L (ref 3.5–5.1)
Sodium: 135 mmol/L (ref 135–145)

## 2023-06-28 LAB — PHOSPHORUS: Phosphorus: 2.7 mg/dL (ref 2.5–4.6)

## 2023-06-28 LAB — MAGNESIUM: Magnesium: 1.8 mg/dL (ref 1.7–2.4)

## 2023-06-28 LAB — CBC
HCT: 41 % (ref 36.0–46.0)
Hemoglobin: 13.4 g/dL (ref 12.0–15.0)
MCH: 33.5 pg (ref 26.0–34.0)
MCHC: 32.7 g/dL (ref 30.0–36.0)
MCV: 102.5 fL — ABNORMAL HIGH (ref 80.0–100.0)
Platelets: 294 10*3/uL (ref 150–400)
RBC: 4 MIL/uL (ref 3.87–5.11)
RDW: 12.2 % (ref 11.5–15.5)
WBC: 10.1 10*3/uL (ref 4.0–10.5)
nRBC: 0 % (ref 0.0–0.2)

## 2023-06-28 NOTE — Progress Notes (Signed)
 1 Day Post-Op  Subjective: Patient has moderate incisional pain which is well-controlled.  Has not had flatus or bowel movement yet.  Objective: Vital signs in last 24 hours: Temp:  [96.3 F (35.7 C)-99.1 F (37.3 C)] 99.1 F (37.3 C) (06/21 0352) Pulse Rate:  [69-89] 88 (06/21 0352) Resp:  [13-19] 16 (06/21 0352) BP: (114-141)/(53-97) 121/57 (06/21 0352) SpO2:  [92 %-100 %] 94 % (06/21 0352) Weight:  [106.7 kg] 106.7 kg (06/21 0352) Last BM Date : 06/26/23  Intake/Output from previous day: 06/20 0701 - 06/21 0700 In: 2834.9 [P.O.:240; I.V.:2494.9; IV Piggyback:100] Out: 1450 [Urine:1350; Blood:100] Intake/Output this shift: No intake/output data recorded.  General appearance: alert, cooperative, and no distress Resp: clear to auscultation bilaterally Cardio: regular rate and rhythm, S1, S2 normal, no murmur, click, rub or gallop GI: Soft, incisions healing well.  Abdominal binder in place.  No bowel sounds present.  Lab Results:  Recent Labs    06/28/23 0354  WBC 10.1  HGB 13.4  HCT 41.0  PLT 294   BMET Recent Labs    06/28/23 0354  NA 135  K 3.7  CL 106  CO2 22  GLUCOSE 93  BUN 9  CREATININE 0.67  CALCIUM 8.3*   PT/INR No results for input(s): LABPROT, INR in the last 72 hours.  Studies/Results: No results found.  Anti-infectives: Anti-infectives (From admission, onward)    Start     Dose/Rate Route Frequency Ordered Stop   06/27/23 2000  cefoTEtan  (CEFOTAN ) 2 g in sodium chloride  0.9 % 100 mL IVPB        2 g 200 mL/hr over 30 Minutes Intravenous Every 12 hours 06/27/23 1537 06/29/23 0959   06/27/23 0630  cefoTEtan  (CEFOTAN ) 2 g in sodium chloride  0.9 % 100 mL IVPB        2 g 200 mL/hr over 30 Minutes Intravenous On call to O.R. 06/27/23 0629 06/27/23 0900       Assessment/Plan: s/p Procedure(s): COLECTOMY, PARTIAL, ROBOT-ASSISTED, LAPAROSCOPIC LYSIS, ADHESIONS, LAPAROSCOPIC Impression: Stable on postoperative day 1.  Awaiting return of  bowel function.  Labs look good.  Patient to be getting up in chair and ambulating today.  Continue clear liquid diet for today.  Foley is out.  LOS: 1 day    Isabella Walker 06/28/2023

## 2023-06-28 NOTE — Anesthesia Postprocedure Evaluation (Signed)
 Anesthesia Post Note  Patient: Isabella Walker  Procedure(s) Performed: COLECTOMY, PARTIAL, ROBOT-ASSISTED, LAPAROSCOPIC (Abdomen) LYSIS, ADHESIONS, LAPAROSCOPIC (Abdomen)  Patient location during evaluation: Phase II Anesthesia Type: General Level of consciousness: awake Pain management: pain level controlled Vital Signs Assessment: post-procedure vital signs reviewed and stable Respiratory status: spontaneous breathing and respiratory function stable Cardiovascular status: blood pressure returned to baseline and stable Postop Assessment: no headache and no apparent nausea or vomiting Anesthetic complications: no Comments: Late entry   No notable events documented.   Last Vitals:  Vitals:   06/28/23 0050 06/28/23 0352  BP: (!) 126/57 (!) 121/57  Pulse: 69 88  Resp: 16 16  Temp: 37.2 C 37.3 C  SpO2: 94% 94%    Last Pain:  Vitals:   06/28/23 0703  TempSrc:   PainSc: Asleep                 Yvonna JINNY Bosworth

## 2023-06-28 NOTE — Progress Notes (Signed)
   06/28/23 1725  TOC Brief Assessment  Insurance and Status Reviewed  Patient has primary care physician Yes  Home environment has been reviewed From home  Prior level of function: Independent  Prior/Current Home Services No current home services  Social Drivers of Health Review SDOH reviewed no interventions necessary  Readmission risk has been reviewed Yes  Transition of care needs no transition of care needs at this time   Pt is active c/DSS. Food insecurity is listed in SDOH. Pt has BMI of 41.  Transition of Care Department [facility]) has reviewed patient and no TOC needs have been identified at this time. We will continue to monitor patient advancement through interdisciplinary progression rounds. If new patient transition needs arise, please place a TOC consult.

## 2023-06-28 NOTE — Plan of Care (Signed)
   Problem: Education: Goal: Understanding of discharge needs will improve Outcome: Progressing   Problem: Activity: Goal: Ability to tolerate increased activity will improve Outcome: Progressing

## 2023-06-29 ENCOUNTER — Encounter (HOSPITAL_COMMUNITY): Payer: Self-pay | Admitting: General Surgery

## 2023-06-29 LAB — CBC
HCT: 40.6 % (ref 36.0–46.0)
Hemoglobin: 12.8 g/dL (ref 12.0–15.0)
MCH: 32.8 pg (ref 26.0–34.0)
MCHC: 31.5 g/dL (ref 30.0–36.0)
MCV: 104.1 fL — ABNORMAL HIGH (ref 80.0–100.0)
Platelets: 235 10*3/uL (ref 150–400)
RBC: 3.9 MIL/uL (ref 3.87–5.11)
RDW: 12.6 % (ref 11.5–15.5)
WBC: 7.9 10*3/uL (ref 4.0–10.5)
nRBC: 0 % (ref 0.0–0.2)

## 2023-06-29 LAB — PHOSPHORUS: Phosphorus: 2.3 mg/dL — ABNORMAL LOW (ref 2.5–4.6)

## 2023-06-29 LAB — MAGNESIUM: Magnesium: 1.9 mg/dL (ref 1.7–2.4)

## 2023-06-29 MED ORDER — POTASSIUM PHOSPHATES 15 MMOLE/5ML IV SOLN
15.0000 mmol | Freq: Once | INTRAVENOUS | Status: AC
  Administered 2023-06-29: 15 mmol via INTRAVENOUS
  Filled 2023-06-29: qty 5

## 2023-06-29 NOTE — Plan of Care (Signed)
  Problem: Education: Goal: Understanding of discharge needs will improve Outcome: Progressing Goal: Verbalization of understanding of the causes of altered bowel function will improve Outcome: Progressing   Problem: Activity: Goal: Ability to tolerate increased activity will improve Outcome: Progressing   Problem: Bowel/Gastric: Goal: Gastrointestinal status for postoperative course will improve Outcome: Progressing   Problem: Health Behavior/Discharge Planning: Goal: Identification of community resources to assist with postoperative recovery needs will improve Outcome: Progressing   Problem: Clinical Measurements: Goal: Postoperative complications will be avoided or minimized Outcome: Progressing   Problem: Skin Integrity: Goal: Will show signs of wound healing Outcome: Progressing

## 2023-06-29 NOTE — Progress Notes (Signed)
 2 Days Post-Op  Subjective: Patient still having moderate incisional pain which is well-controlled with Dilaudid .  Is passing gas.  Has not had a bowel movement.  Objective: Vital signs in last 24 hours: Temp:  [98.4 F (36.9 C)-99.1 F (37.3 C)] 98.4 F (36.9 C) (06/22 0351) Pulse Rate:  [63-77] 77 (06/22 0351) Resp:  [16-18] 16 (06/22 0351) BP: (113-124)/(47-60) 119/50 (06/22 0351) SpO2:  [94 %-97 %] 97 % (06/22 0351) Weight:  [107.3 kg] 107.3 kg (06/22 0351) Last BM Date : 06/26/23  Intake/Output from previous day: 06/21 0701 - 06/22 0700 In: 865.1 [P.O.:600; I.V.:265.1] Out: -  Intake/Output this shift: No intake/output data recorded.  General appearance: alert, cooperative, and no distress Resp: clear to auscultation bilaterally Cardio: regular rate and rhythm, S1, S2 normal, no murmur, click, rub or gallop GI: Soft, incisions healing wellbowel sounds present.  Lab Results:  Recent Labs    06/28/23 0354 06/29/23 0359  WBC 10.1 7.9  HGB 13.4 12.8  HCT 41.0 40.6  PLT 294 235   BMET Recent Labs    06/28/23 0354  NA 135  K 3.7  CL 106  CO2 22  GLUCOSE 93  BUN 9  CREATININE 0.67  CALCIUM 8.3*   PT/INR No results for input(s): LABPROT, INR in the last 72 hours.  Studies/Results: No results found.  Anti-infectives: Anti-infectives (From admission, onward)    Start     Dose/Rate Route Frequency Ordered Stop   06/27/23 2000  cefoTEtan  (CEFOTAN ) 2 g in sodium chloride  0.9 % 100 mL IVPB        2 g 200 mL/hr over 30 Minutes Intravenous Every 12 hours 06/27/23 1537 06/28/23 2242   06/27/23 0630  cefoTEtan  (CEFOTAN ) 2 g in sodium chloride  0.9 % 100 mL IVPB        2 g 200 mL/hr over 30 Minutes Intravenous On call to O.R. 06/27/23 0629 06/27/23 0900       Assessment/Plan: s/p Procedure(s): COLECTOMY, PARTIAL, ROBOT-ASSISTED, LAPAROSCOPIC LYSIS, ADHESIONS, LAPAROSCOPIC Impression: Stable and postoperative day 2.  Bowel function starting to return.   Patient reports not having a bowel movement yet.  Mild hypophosphatemia noted and this will be addressed.  I encouraged patient to ambulate.  Awaiting full return of bowel function.  Will advance to full liquid diet.  LOS: 2 days    Oneil Budge 06/29/2023

## 2023-06-29 NOTE — Plan of Care (Signed)

## 2023-06-29 NOTE — Progress Notes (Signed)
 Pharmacy Electrolyte Replacement  Recent Labs:  Recent Labs    06/28/23 0354 06/29/23 0359  K 3.7  --   MG 1.8 1.9  PHOS 2.7 2.3*  CREATININE 0.67  --     Low Critical Values (K </= 2.5, Phos </= 1, Mg </= 1) Present: Phos = 2.3  Plan: KPhos 15mmol IV x 1 F/U Phos in AM  Cherlyn Boers, BS Pharm D, BCPS Clinical Pharmacist

## 2023-06-30 LAB — BASIC METABOLIC PANEL WITH GFR
Anion gap: 8 (ref 5–15)
BUN: 8 mg/dL (ref 6–20)
CO2: 23 mmol/L (ref 22–32)
Calcium: 8.5 mg/dL — ABNORMAL LOW (ref 8.9–10.3)
Chloride: 106 mmol/L (ref 98–111)
Creatinine, Ser: 0.57 mg/dL (ref 0.44–1.00)
GFR, Estimated: >60 mL/min (ref >60)
Glucose, Bld: 132 mg/dL — ABNORMAL HIGH (ref 70–99)
Potassium: 3.4 mmol/L — ABNORMAL LOW (ref 3.5–5.1)
Sodium: 137 mmol/L (ref 135–145)

## 2023-06-30 LAB — CBC
HCT: 40.3 % (ref 36.0–46.0)
Hemoglobin: 13.2 g/dL (ref 12.0–15.0)
MCH: 33.9 pg (ref 26.0–34.0)
MCHC: 32.8 g/dL (ref 30.0–36.0)
MCV: 103.6 fL — ABNORMAL HIGH (ref 80.0–100.0)
Platelets: 270 10*3/uL (ref 150–400)
RBC: 3.89 MIL/uL (ref 3.87–5.11)
RDW: 12.8 % (ref 11.5–15.5)
WBC: 8 10*3/uL (ref 4.0–10.5)
nRBC: 0 % (ref 0.0–0.2)

## 2023-06-30 LAB — MAGNESIUM: Magnesium: 1.9 mg/dL (ref 1.7–2.4)

## 2023-06-30 LAB — PHOSPHORUS: Phosphorus: 3.3 mg/dL (ref 2.5–4.6)

## 2023-06-30 LAB — SURGICAL PATHOLOGY

## 2023-06-30 MED ORDER — POTASSIUM CHLORIDE 20 MEQ PO PACK
60.0000 meq | PACK | Freq: Once | ORAL | Status: AC
  Administered 2023-06-30: 60 meq via ORAL
  Filled 2023-06-30: qty 3

## 2023-06-30 MED ORDER — ALVIMOPAN 12 MG PO CAPS
12.0000 mg | ORAL_CAPSULE | Freq: Two times a day (BID) | ORAL | Status: AC
  Administered 2023-06-30 – 2023-07-01 (×2): 12 mg via ORAL
  Filled 2023-06-30 (×2): qty 1

## 2023-06-30 MED ORDER — DOCUSATE SODIUM 100 MG PO CAPS
100.0000 mg | ORAL_CAPSULE | Freq: Two times a day (BID) | ORAL | Status: DC
  Administered 2023-06-30 – 2023-07-01 (×2): 100 mg via ORAL
  Filled 2023-06-30 (×2): qty 1

## 2023-06-30 NOTE — Progress Notes (Signed)
 [name]  3 Days Post-Op  Subjective: Had another small BM. No blood. No nausea/ adv to soft diet. Up in the chair.   Objective: Vital signs in last 24 hours: Temp:  [98 F (36.7 C)-98.8 F (37.1 C)] 98.5 F (36.9 C) (06/23 1326) Pulse Rate:  [61-87] 87 (06/23 1326) Resp:  [16-18] 18 (06/23 0800) BP: (109-134)/(54-59) 115/58 (06/23 1326) SpO2:  [93 %-99 %] 99 % (06/23 1326) Weight:  [110.1 kg] 110.1 kg (06/23 0433) Last BM Date : 06/30/23  Intake/Output from previous day: 06/22 0701 - 06/23 0700 In: 960 [P.O.:960] Out: -  Intake/Output this shift: No intake/output data recorded.  General appearance: alert and no distress GI: soft, nondistened, appropriately tender, gauze removed from port sites, c/d/I without erythema or drainage, the midline honeycomb without staining, left in place   Lab Results:  Recent Labs    06/29/23 0359 06/30/23 0409  WBC 7.9 8.0  HGB 12.8 13.2  HCT 40.6 40.3  PLT 235 270   BMET Recent Labs    06/28/23 0354 06/30/23 0409  NA 135 137  K 3.7 3.4*  CL 106 106  CO2 22 23  GLUCOSE 93 132*  BUN 9 8  CREATININE 0.67 0.57  CALCIUM 8.3* 8.5*   PT/INR No results for input(s): LABPROT, INR in the last 72 hours.  Studies/Results: No results found.  Anti-infectives: Anti-infectives (From admission, onward)    Start     Dose/Rate Route Frequency Ordered Stop   06/27/23 2000  cefoTEtan  (CEFOTAN ) 2 g in sodium chloride  0.9 % 100 mL IVPB        2 g 200 mL/hr over 30 Minutes Intravenous Every 12 hours 06/27/23 1537 06/28/23 2242   06/27/23 0630  cefoTEtan  (CEFOTAN ) 2 g in sodium chloride  0.9 % 100 mL IVPB        2 g 200 mL/hr over 30 Minutes Intravenous On call to O.R. 06/27/23 0629 06/27/23 0900       Assessment/Plan: Patient s/p robotic assisted sigmoid colectomy. Doing well.  PRN for pain Encouraged her to use the roxicodone   IS< OOB HD wnl Soft diet Colace added Entereg  to stop after 2  more doses, since reported small BM No labs tomorrow, no leukocytosis, H&H stable K replaced today SCDs, heparin  sq  Hopefully home tomorrow   LOS: 3 days    Manuelita JAYSON Pander 06/30/2023

## 2023-06-30 NOTE — Discharge Instructions (Signed)
 Discharge Open Abdominal Surgery Instructions:  Common Complaints: Pain at the incision site is common. This will improve with time. Take your pain medications as described below. Some nausea is common and poor appetite. The main goal is to stay hydrated the first few days after surgery.   Diet/ Activity: Diet as tolerated. You have started and tolerated a diet in the hospital, and should continue to increase what you are able to eat.   You may not have a large appetite, but it is important to stay hydrated. Drink 64 ounces of water  a day. Your appetite will return with time.  Keep a dry dressing in place over your staples daily or as needed. Some minor pink/ blood tinged drainage is expected. This will stop in a few days after surgery.  Shower per your regular routine daily.  Pat the incision dry. Wear an abdominal binder daily with activity. You do not have to wear this while sleeping or sitting.  Rest and listen to your body, but do not remain in bed all day.  Walk everyday for at least 15-20 minutes. Deep cough and move around every 1-2 hours in the first few days after surgery.  Do not lift > 10 lbs, perform excessive bending, pushing, pulling, squatting for 6-8 weeks after surgery.  The activity restrictions and the abdominal binder are to prevent hernia formation at your incision while you are healing.  Do not place lotions or balms on your incision unless instructed to specifically by Dr. Kallie.   Pain Expectations and Narcotics: -After surgery you will have pain associated with your incisions and this is normal. The pain is muscular and nerve pain, and will get better with time. -You are encouraged and expected to take non narcotic medications like tylenol  and ibuprofen  (when able) to treat pain as multiple modalities can aid with pain treatment. -Narcotics are only used when pain is severe or there is breakthrough pain. -You are not expected to have a pain score of 0 after surgery,  as we cannot prevent pain. A pain score of 3-4 that allows you to be functional, move, walk, and tolerate some activity is the goal. The pain will continue to improve over the days after surgery and is dependent on your surgery. -Due to [place] law, we are only able to give a certain amount of pain medication to treat post operative pain, and we only give additional narcotics on a patient by patient basis.  -For most laparoscopic surgery, studies have shown that the majority of patients only need 10-15 narcotic pills, and for open surgeries most patients only need 15-20.   -Having appropriate expectations of pain and knowledge of pain management with non narcotics is important as we do not want anyone to become addicted to narcotic pain medication.  -Using ice packs in the first 48 hours and heating pads after 48 hours, wearing an abdominal binder (when recommended), and using over the counter medications are all ways to help with pain management.   -Simple acts like meditation and mindfulness practices after surgery can also help with pain control and research has proven the benefit of these practices.  Medication: Take tylenol  and ibuprofen  as needed for pain control, alternating every 4-6 hours.  Example:  Tylenol  1000mg  @ 6am, 12noon, 6pm, (Do not exceed 4000mg  of tylenol  a day). Ibuprofen  800mg  @ 9am, 3pm, 9pm, 3am (Do not exceed 3600mg  of ibuprofen  a day).  Take Roxicodone  for breakthrough pain every 4 hours.  Take Colace for constipation  related to narcotic pain medication. If you do not have a bowel movement in 2 days, take Miralax over the counter.  Drink plenty of water  to also prevent constipation.   Contact Information: If you have questions or concerns, please call our office, [phone], Monday- Thursday 8AM-5PM and Friday 8AM-12Noon.  If it is after hours or on the weekend, please call [facility], [phone], [phone], and ask to speak to the surgeon on call for  Dr. Kallie at [facility].

## 2023-07-01 DIAGNOSIS — S0501XA Injury of conjunctiva and corneal abrasion without foreign body, right eye, initial encounter: Secondary | ICD-10-CM | POA: Insufficient documentation

## 2023-07-01 MED ORDER — KETOROLAC TROMETHAMINE 0.5 % OP SOLN: 1.0000 [drp] | Freq: Four times a day (QID) | OPHTHALMIC | Status: AC

## 2023-07-01 MED ORDER — DOCUSATE SODIUM 100 MG PO CAPS: 100.0000 mg | ORAL_CAPSULE | Freq: Two times a day (BID) | ORAL | 0 refills | Status: AC | PRN

## 2023-07-01 MED ORDER — ONDANSETRON HCL 4 MG PO TABS
4.0000 mg | ORAL_TABLET | Freq: Four times a day (QID) | ORAL | 0 refills | Status: DC | PRN
Start: 2023-07-01 — End: 2023-07-09

## 2023-07-01 MED ORDER — OXYCODONE HCL 5 MG PO TABS: 5.0000 mg | ORAL_TABLET | ORAL | 0 refills | Status: DC | PRN

## 2023-07-01 NOTE — Plan of Care (Signed)
  Problem: Education: Goal: Understanding of discharge needs will improve Outcome: Progressing Goal: Verbalization of understanding of the causes of altered bowel function will improve Outcome: Progressing   Problem: Bowel/Gastric: Goal: Gastrointestinal status for postoperative course will improve Outcome: Progressing   Problem: Clinical Measurements: Goal: Postoperative complications will be avoided or minimized Outcome: Progressing   Problem: Respiratory: Goal: Respiratory status will improve Outcome: Progressing   Problem: Skin Integrity: Goal: Will show signs of wound healing Outcome: Progressing   Problem: Clinical Measurements: Goal: Ability to maintain clinical measurements within normal limits will improve Outcome: Progressing Goal: Will remain free from infection Outcome: Progressing Goal: Diagnostic test results will improve Outcome: Progressing Goal: Respiratory complications will improve Outcome: Progressing Goal: Cardiovascular complication will be avoided Outcome: Progressing   Problem: Elimination: Goal: Will not experience complications related to bowel motility Outcome: Progressing Goal: Will not experience complications related to urinary retention Outcome: Progressing   Problem: Pain Managment: Goal: General experience of comfort will improve and/or be controlled Outcome: Progressing   Problem: Safety: Goal: Ability to remain free from injury will improve Outcome: Progressing   Problem: Skin Integrity: Goal: Risk for impaired skin integrity will decrease Outcome: Progressing

## 2023-07-01 NOTE — Discharge Summary (Signed)
 Physician Discharge Summary  Patient ID: Isabella Walker MRN: [id number] DOB/AGE: February 01, 1980 43 y.o.  Admit date: 06/27/2023 Discharge date: 07/01/2023  Admission Diagnoses: Sigmoid diverticulitis   Discharge Diagnoses:  Principal Problem:   Sigmoid diverticulitis Active Problems:   Corneal abrasion, right   Discharged Condition: good  Hospital Course: Isabella Walker is a 43 yo who had complicated diverticulitis with microperforation in the winter, and came in for an elective partial colectomy to remove the diseased colon. She underwent a robotic assist ed sigmoid colectomy and did well post operatively. She has been eating, having pain control with oral pain medication. She did have a slight corneal abrasion and she was treated with toradol  eye drops in the hospital. She has no pain complaints on the eye or vision issues at the time of discharge.   She has been walking, and is ready to go home. She took a shower yesterday.   Consults: None  Significant Diagnostic Studies:  Lab Results  Component Value Date   WBC 8.0 06/30/2023   HGB 13.2 06/30/2023   HCT 40.3 06/30/2023   MCV 103.6 (H) 06/30/2023   PLT 270 06/30/2023   Lab Results  Component Value Date   NA 137 06/30/2023   K 3.4 (L) 06/30/2023   CO2 23 06/30/2023   GLUCOSE 132 (H) 06/30/2023   BUN 8 06/30/2023   CREATININE 0.57 06/30/2023   CALCIUM 8.5 (L) 06/30/2023   GFRNONAA >60 06/30/2023     Treatments: IV hydration and surgery: Robotic assisted sigmoid colectomy 06/27/23  Pathology: A. COLON, SIGMOID, RESECTION:  Diverticulitis with pericolonic inflammation and fibrosis.  Negative for malignancy   Discharge Exam: Blood pressure (!) 107/59, pulse 62, temperature 98 F (36.7 C), temperature source Oral, resp. rate 18, height 5' 3 (1.6 m), weight 105 kg, SpO2 97%. General appearance: alert and no distress Eyes: no redness of the eyes, no drainage Resp: normal work of breathing GI: soft,nondistended,  appropriately tender, port sites c/d/I with staples, midline c/d/I with staples, mild bruising at the midline   Disposition: Discharge disposition: 01-Home or Self Care       Discharge Instructions     Call MD for:  difficulty breathing, headache or visual disturbances   Complete by: As directed    Call MD for:  extreme fatigue   Complete by: As directed    Call MD for:  persistant dizziness or light-headedness   Complete by: As directed    Call MD for:  persistant nausea and vomiting   Complete by: As directed    Call MD for:  redness, tenderness, or signs of infection (pain, swelling, redness, odor or green/yellow discharge around incision site)   Complete by: As directed    Call MD for:  severe uncontrolled pain   Complete by: As directed    Call MD for:  temperature >100.4   Complete by: As directed    Increase activity slowly   Complete by: As directed       Allergies as of 07/01/2023       Reactions   Bextra [valdecoxib] Other (See Comments)   Severe nose bleeds        Medication List     TAKE these medications    Accu-Chek Guide test strip Generic drug: glucose blood   acetaminophen  325 MG tablet Commonly known as: TYLENOL  Take 2 tablets (650 mg total) by mouth every 6 (six) hours as needed for mild pain (pain score 1-3), fever or headache (or Fever >/= 101).  docusate sodium  100 MG capsule Commonly known as: COLACE Take 1 capsule (100 mg total) by mouth 2 (two) times daily as needed for mild constipation (keep stools regular and soft).   ketorolac  0.5 % ophthalmic solution Commonly known as: ACULAR  Place 1 drop into the right eye every 6 (six) hours for 3 days.   levonorgestrel 20 MCG/24HR IUD Commonly known as: MIRENA 1 each by Intrauterine route once.   ondansetron  4 MG tablet Commonly known as: ZOFRAN  Take 1 tablet (4 mg total) by mouth every 6 (six) hours as needed for nausea.   oxyCODONE  5 MG immediate release tablet Commonly known as:  Oxy IR/ROXICODONE  Take 1 tablet (5 mg total) by mouth every 4 (four) hours as needed for severe pain (pain score 7-10) or breakthrough pain.        Follow-up Information     Kallie Manuelita BROCKS, MD Follow up on 07/09/2023.   Specialty: General Surgery Why: staple removal Contact information: [address] [place] [facility] [address] [phone]                Future Appointments  Date Time Provider Department Center  07/09/2023  9:45 AM Kallie Manuelita BROCKS, MD [facility] None    Signed: Manuelita BROCKS Kallie 07/01/2023, 9:17 AM

## 2023-07-09 ENCOUNTER — Encounter: Payer: Self-pay | Admitting: General Surgery

## 2023-07-09 ENCOUNTER — Ambulatory Visit (INDEPENDENT_AMBULATORY_CARE_PROVIDER_SITE_OTHER): Admitting: General Surgery

## 2023-07-09 VITALS — BP 155/92 | HR 80 | Temp 98.7°F | Resp 16 | Ht 63.0 in | Wt 233.0 lb

## 2023-07-09 DIAGNOSIS — K5732 Diverticulitis of large intestine without perforation or abscess without bleeding: Secondary | ICD-10-CM

## 2023-07-09 MED ORDER — OXYCODONE HCL 5 MG PO TABS: 5.0000 mg | ORAL_TABLET | ORAL | 0 refills | Status: AC | PRN

## 2023-07-09 NOTE — Progress Notes (Signed)
 [facility]  Doing well. Having pain at the midline extraction site.  BP (!) 155/92   Pulse 80   Temp 98.7 F (37.1 C) (Oral)   Resp 16   Ht 5' 3 (1.6 m)   Wt 233 lb (105.7 kg)   SpO2 99%   BMI 41.27 kg/m  Soft, nondistended, tender at the midline, staples removed, no erythema or drainage, steri strips placed   Patient s/p robotic sigmoid colectomy. Doing well.   Continue to eat and keep your stool regular and soft. Monitor your incisions for any redness extending out or signs of purulent drainage. If you have any worsening internal pain or fevers (>101), you will need to go to the ED to get worked up. If you are worried you can call the office, or you can call the after hour numbers and ask to speak with me. [phone] or [phone].   Steristrips will peel off in the next 5-7 days. You can remove them once they are peeling off. It is ok to shower. Pat the area dry.  No heavy lifting > 10 lbs, excessive bending, pushing, pulling, or squatting for 6-8 weeks after surgery.   Future Appointments  Date Time Provider Department Center  07/15/2023  9:00 AM Kallie Manuelita BROCKS, MD [facility] None   Manuelita Kallie, MD [facility] [address] [place], [place] [address] [phone] (office)

## 2023-07-09 NOTE — Patient Instructions (Addendum)
 Continue to eat and keep your stool regular and soft. Monitor your incisions for any redness extending out or signs of purulent drainage. If you have any worsening internal pain or fevers (>101), you will need to go to the ED to get worked up. If you are worried you can call the office, or you can call the after hour numbers and ask to speak with me. [phone] or [phone].   Steristrips will peel off in the next 5-7 days. You can remove them once they are peeling off. It is ok to shower. Pat the area dry.  No heavy lifting > 10 lbs, excessive bending, pushing, pulling, or squatting for 6-8 weeks after surgery.

## 2023-07-15 ENCOUNTER — Encounter: Admitting: General Surgery

## 2023-07-21 ENCOUNTER — Encounter (HOSPITAL_COMMUNITY): Payer: Self-pay

## 2023-07-21 ENCOUNTER — Emergency Department (HOSPITAL_COMMUNITY)
Admission: EM | Admit: 2023-07-21 | Discharge: 2023-07-21 | Disposition: A | Discharge status: Home / Self Care | Attending: Emergency Medicine | Admitting: Emergency Medicine

## 2023-07-21 ENCOUNTER — Emergency Department (HOSPITAL_COMMUNITY)

## 2023-07-21 ENCOUNTER — Other Ambulatory Visit: Payer: Self-pay

## 2023-07-21 ENCOUNTER — Telehealth: Payer: Self-pay | Admitting: *Deleted

## 2023-07-21 DIAGNOSIS — E119 Type 2 diabetes mellitus without complications: Secondary | ICD-10-CM | POA: Insufficient documentation

## 2023-07-21 DIAGNOSIS — R110 Nausea: Secondary | ICD-10-CM | POA: Insufficient documentation

## 2023-07-21 DIAGNOSIS — Z9049 Acquired absence of other specified parts of digestive tract: Secondary | ICD-10-CM | POA: Diagnosis not present

## 2023-07-21 DIAGNOSIS — R1084 Generalized abdominal pain: Secondary | ICD-10-CM | POA: Insufficient documentation

## 2023-07-21 DIAGNOSIS — R109 Unspecified abdominal pain: Secondary | ICD-10-CM

## 2023-07-21 LAB — COMPREHENSIVE METABOLIC PANEL WITH GFR
ALT: 13 U/L (ref 0–44)
AST: 12 U/L — ABNORMAL LOW (ref 15–41)
Albumin: 3.7 g/dL (ref 3.5–5.0)
Alkaline Phosphatase: 45 U/L (ref 38–126)
Anion gap: 11 (ref 5–15)
BUN: 16 mg/dL (ref 6–20)
CO2: 23 mmol/L (ref 22–32)
Calcium: 9.1 mg/dL (ref 8.9–10.3)
Chloride: 107 mmol/L (ref 98–111)
Creatinine, Ser: 0.72 mg/dL (ref 0.44–1.00)
GFR, Estimated: >60 mL/min (ref >60)
Glucose, Bld: 117 mg/dL — ABNORMAL HIGH (ref 70–99)
Potassium: 4 mmol/L (ref 3.5–5.1)
Sodium: 141 mmol/L (ref 135–145)
Total Bilirubin: 0.7 mg/dL (ref 0.0–1.2)
Total Protein: 7.2 g/dL (ref 6.5–8.1)

## 2023-07-21 LAB — CBC
HCT: 42.2 % (ref 36.0–46.0)
Hemoglobin: 13.9 g/dL (ref 12.0–15.0)
MCH: 33.7 pg (ref 26.0–34.0)
MCHC: 32.9 g/dL (ref 30.0–36.0)
MCV: 102.4 fL — ABNORMAL HIGH (ref 80.0–100.0)
Platelets: 305 K/uL (ref 150–400)
RBC: 4.12 MIL/uL (ref 3.87–5.11)
RDW: 12.7 % (ref 11.5–15.5)
WBC: 7.5 K/uL (ref 4.0–10.5)
nRBC: 0 % (ref 0.0–0.2)

## 2023-07-21 LAB — LIPASE, BLOOD: Lipase: 29 U/L (ref 11–51)

## 2023-07-21 LAB — HCG, QUANTITATIVE, PREGNANCY: hCG, Beta Chain, Quant, S: <1 m[IU]/mL (ref <5)

## 2023-07-21 MED ORDER — ONDANSETRON HCL 4 MG/2ML IJ SOLN
4.0000 mg | Freq: Once | INTRAMUSCULAR | Status: AC
  Administered 2023-07-21: 4 mg via INTRAVENOUS
  Filled 2023-07-21: qty 2

## 2023-07-21 MED ORDER — IOHEXOL 300 MG/ML  SOLN
100.0000 mL | Freq: Once | INTRAMUSCULAR | Status: AC | PRN
  Administered 2023-07-21: 100 mL via INTRAVENOUS

## 2023-07-21 MED ORDER — SODIUM CHLORIDE 0.9 % IV BOLUS
1000.0000 mL | Freq: Once | INTRAVENOUS | Status: AC
  Administered 2023-07-21: 1000 mL via INTRAVENOUS

## 2023-07-21 MED ORDER — MORPHINE SULFATE (PF) 4 MG/ML IV SOLN
4.0000 mg | Freq: Once | INTRAVENOUS | Status: AC
  Administered 2023-07-21: 4 mg via INTRAVENOUS
  Filled 2023-07-21: qty 1

## 2023-07-21 NOTE — ED Provider Notes (Signed)
 [place] EMERGENCY DEPARTMENT AT [facility] Provider Note   CSN: [id number] Arrival date & time: 07/21/23  8366     Patient presents with: Abdominal Pain  HPI Isabella Walker is a 43 y.o. female s/p partial colectomy in June, diabetes presenting for abdominal pain.  Started last night.  It is all about the lower abdomen.  She states the incision site looks okay but the pain is new since the surgery.  Endorses some nausea but no vomiting or diarrhea.  Denies abnormal vaginal bleeding or discharge.  Denies urinary symptoms.  Denies fever.    Abdominal Pain      Prior to Admission medications   Medication Sig Start Date End Date Taking? Authorizing Provider  ACCU-CHEK GUIDE test strip  08/15/21   [provider]  acetaminophen  (TYLENOL ) 325 MG tablet Take 2 tablets (650 mg total) by mouth every 6 (six) hours as needed for mild pain (pain score 1-3), fever or headache (or Fever >/= 101). 02/18/23   Pearlean Manus, MD  docusate sodium  (COLACE) 100 MG capsule Take 1 capsule (100 mg total) by mouth 2 (two) times daily as needed for mild constipation (keep stools regular and soft). 07/01/23   Kallie Manuelita BROCKS, MD  levonorgestrel (MIRENA) 20 MCG/24HR IUD 1 each by Intrauterine route once.     [provider]  oxyCODONE  (OXY IR/ROXICODONE ) 5 MG immediate release tablet Take 1 tablet (5 mg total) by mouth every 4 (four) hours as needed for severe pain (pain score 7-10) or breakthrough pain. 07/09/23   Kallie Manuelita BROCKS, MD    Allergies: Bextra [valdecoxib]    Review of Systems  Gastrointestinal:  Positive for abdominal pain.    Updated Vital Signs BP 124/67 (BP Location: Right Arm)   Pulse 83   Temp 99 F (37.2 C) (Oral)   Resp 18   Ht 5' 3 (1.6 m)   Wt 105.7 kg   SpO2 99%   BMI 41.27 kg/m   Physical Exam Vitals and nursing note reviewed.  HENT:     Head: Normocephalic and atraumatic.     Mouth/Throat:     Mouth: Mucous membranes are moist.   Eyes:     General:        Right eye: No discharge.        Left eye: No discharge.     Conjunctiva/sclera: Conjunctivae normal.  Cardiovascular:     Rate and Rhythm: Normal rate and regular rhythm.     Pulses: Normal pulses.     Heart sounds: Normal heart sounds.  Pulmonary:     Effort: Pulmonary effort is normal.     Breath sounds: Normal breath sounds.  Abdominal:     General: Abdomen is flat.     Palpations: Abdomen is soft.     Tenderness: There is abdominal tenderness. There is no right CVA tenderness or left CVA tenderness.      Comments: General lower abdominal tenderness  Skin:    General: Skin is warm and dry.  Neurological:     General: No focal deficit present.  Psychiatric:        Mood and Affect: Mood normal.     (all labs ordered are listed, but only abnormal results are displayed) Labs Reviewed  COMPREHENSIVE METABOLIC PANEL WITH GFR - Abnormal; Notable for the following components:      Result Value   Glucose, Bld 117 (*)    AST 12 (*)    All other components within normal limits  CBC - Abnormal; Notable for the following components:   MCV 102.4 (*)    All other components within normal limits  LIPASE, BLOOD  HCG, QUANTITATIVE, PREGNANCY    EKG: None  Radiology: CT ABDOMEN PELVIS W CONTRAST Result Date: 07/21/2023 CLINICAL DATA:  Postoperative abdominal pain, recent laparoscopic partial colectomy secondary to diverticulitis EXAM: CT ABDOMEN AND PELVIS WITH CONTRAST TECHNIQUE: Multidetector CT imaging of the abdomen and pelvis was performed using the standard protocol following bolus administration of intravenous contrast. RADIATION DOSE REDUCTION: This exam was performed according to the departmental dose-optimization program which includes automated exposure control, adjustment of the mA and/or kV according to patient size and/or use of iterative reconstruction technique. CONTRAST:  OMNIPAQUE  IOHEXOL  300 MG/ML  SOLN COMPARISON:  02/15/2023  FINDINGS: Lower chest: No acute abnormality. Hepatobiliary: No solid liver abnormality is seen. Hepatic steatosis. Gallstones. No gallbladder wall thickening or biliary ductal dilatation. Pancreas: Unremarkable. No pancreatic ductal dilatation or surrounding inflammatory changes. Spleen: Normal in size without significant abnormality. Adrenals/Urinary Tract: Adrenal glands are unremarkable. Kidneys are normal, without renal calculi, solid lesion, or hydronephrosis. Bladder is unremarkable. Stomach/Bowel: Stomach is within normal limits. Appendix appears normal. No evidence of bowel wall thickening, distention, or inflammatory changes. Sigmoid colon resection and reanastomosis. Vascular/Lymphatic: Scattered aortoiliac atherosclerosis. No enlarged abdominal or pelvic lymph nodes. Reproductive: No mass or other significant abnormality. IUD present in the fundal endometrial cavity. Other: Ventral hernia mesh repair. Low midline laparoscopic access with skin staples subcutaneous fat stranding. No focal fluid collection (series 2, image 73). No ascites. Musculoskeletal: No acute or significant osseous findings. IMPRESSION: 1. Low midline laparoscopic access with skin staples and subcutaneous fat stranding. No focal fluid collection. 2. Sigmoid colon resection and reanastomosis. No evidence of postoperative complication. 3. Ventral hernia mesh repair. 4. Hepatic steatosis. 5. Cholelithiasis. Aortic Atherosclerosis (ICD10-I70.0). Electronically Signed   By: Marolyn JONETTA Jaksch M.D.   On: 07/21/2023 18:57     Procedures   Medications Ordered in the ED  sodium chloride  0.9 % bolus 1,000 mL (0 mLs Intravenous Stopped 07/21/23 1902)  ondansetron  (ZOFRAN ) injection 4 mg (4 mg Intravenous Given 07/21/23 1736)  morphine  (PF) 4 MG/ML injection 4 mg (4 mg Intravenous Given 07/21/23 1737)  iohexol  (OMNIPAQUE ) 300 MG/ML solution 100 mL (100 mLs Intravenous Contrast Given 07/21/23 1845)                                    Medical  Decision Making Amount and/or Complexity of Data Reviewed Labs: ordered. Radiology: ordered.  Risk Prescription drug management.   Initial Impression and Ddx 43 year old well-appearing female presenting for postop abdominal pain.  Exam notable for generalized lower abdominal pain.  DDx includes postop infection, bowel obstruction, kidney stone, abscess, cellulitis, other. Patient PMH that increases complexity of ED encounter:  s/p partial colectomy in June, diabetes   Interpretation of Diagnostics - I independent reviewed and interpreted the labs as followed: none  - I independently visualized the following imaging with scope of interpretation limited to determining acute life threatening conditions related to emergency care: CT ab/pelvis, which revealed no acute findings.  See details above.  Patient Reassessment and Ultimate Disposition/Management On reassessment pain had improved.  Workup was unremarkable.  Per chart review, had her partial colectomy with Dr. Kallie on June 20.  Also incision site looks like it is healing very nicely with no obvious signs of infection.  CT scan was unremarkable.  Advised her to follow-up with general surgery and PCP and continue treating her postop abdominal pain conservatively at home and recommended Tylenol  and ibuprofen .  Discussed return precautions.  Fluid challenge with no issue.  Discharged good condition.  Patient management required discussion with the following services or consulting groups:  None  Complexity of Problems Addressed Acute complicated illness or Injury  Additional Data Reviewed and Analyzed Further history obtained from: Further history from spouse/family member, Past medical history and medications listed in the EMR, and Prior ED visit notes  Patient Encounter Risk Assessment Consideration of hospitalization      Final diagnoses:  Abdominal pain, unspecified abdominal location    ED Discharge Orders     None           Lang Norleen POUR, PA-C 07/21/23 1927    Jerrol Agent, MD 07/21/23 2053

## 2023-07-21 NOTE — ED Triage Notes (Signed)
 Pt arrived via POV c/o lower abdominal pain around incision sites from recent laparoscopic partial colectomy procedure. Pt reports last night she began noticing a burning pain around the incisions.

## 2023-07-21 NOTE — Discharge Instructions (Addendum)
 Evaluation today was overall reassuring.  Please follow-up with your PCP and general surgeon.  If symptoms worsen, you develop fever, nausea vomiting diarrhea or any other concerning symptom please return to the ED for further evaluation.

## 2023-07-21 NOTE — Telephone Encounter (Signed)
 Surgical Date: 06/27/2023 Procedure: Partial Colectomy  Received call from patient [phone]~ telephone  Patient reports that she is having sharp pains in her lower abdomen. States that she is also noticing swelling and bulging in her lower abdomen.   States that she does not have any Sx of infection, no fever, chills, nausea, etc.   Advised if pain is severe, go to ER for evaluation. Patient agreeable.

## 2023-07-21 NOTE — ED Notes (Signed)
..  The patient is A&OX4, ambulatory at d/c with independent steady gait,  but was wheeled out of ED via wheelchair. NAD. Pt verbalized understanding of d/c instructions and follow up care.

## 2023-07-30 ENCOUNTER — Encounter: Admitting: General Surgery

## 2023-08-19 IMAGING — CT CT ABD-PELV W/ CM
2 of 5 series · 16 of 46 positions shown, 18 images · IV contrast (omnipaque)
Comparison: None.

CLINICAL DATA: LLQ abdominal pain

EXAM:
CT ABDOMEN AND PELVIS WITH CONTRAST
TECHNIQUE: Multidetector CT imaging of the abdomen and pelvis was performed
using the standard protocol following bolus administration of
intravenous contrast.
CONTRAST:  100mL OMNIPAQUE IOHEXOL 300 MG/ML  SOLN

[Series 3: abdomen 5.0 · axial · 0.98mm/px · z∈[+678,+1063]mm · 13 of 89 slices shown, 15 images]
[im 6/89  soft-tissue]
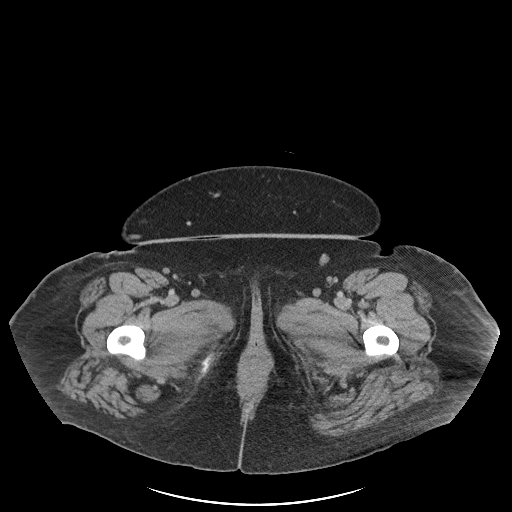
[im 6/89  bone]
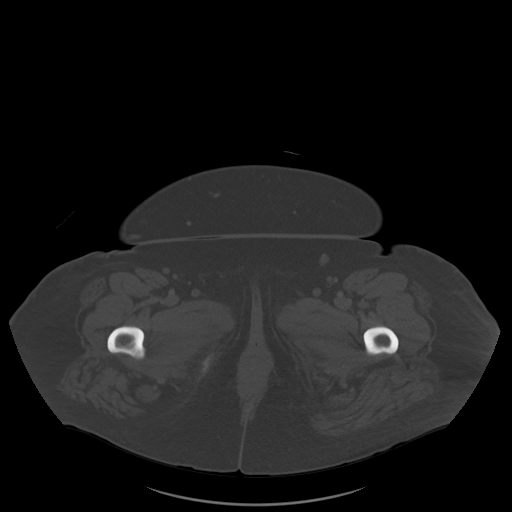
[im 12/89  soft-tissue]
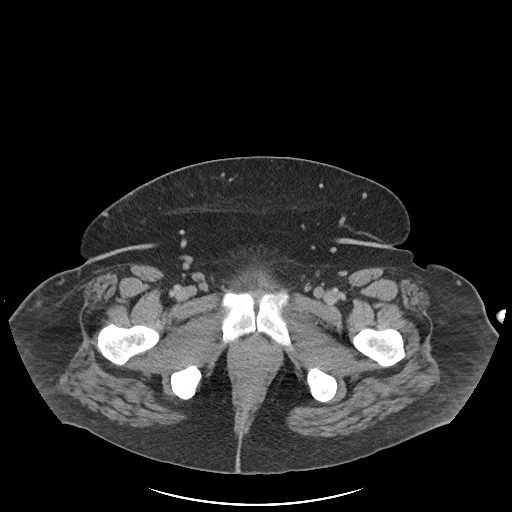
[im 17/89  soft-tissue]
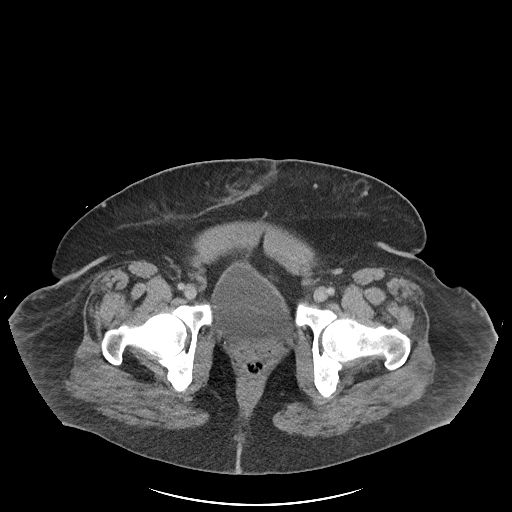
[im 28/89  soft-tissue]
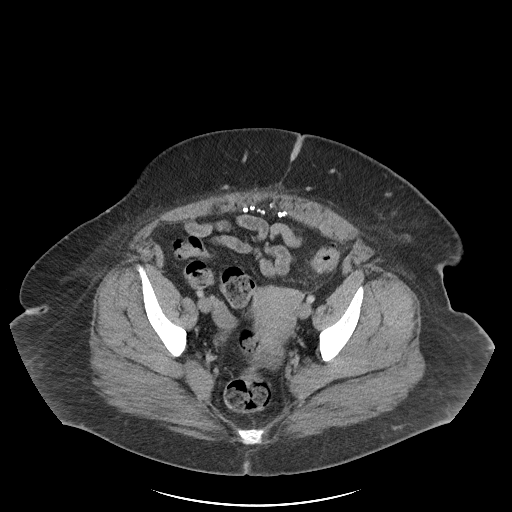
[im 34/89  soft-tissue]
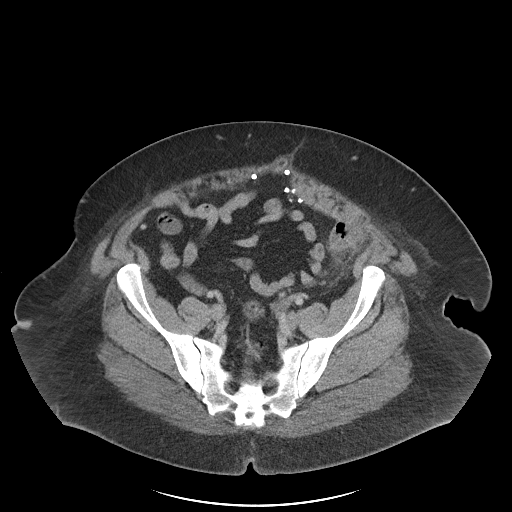
[im 39/89  soft-tissue]
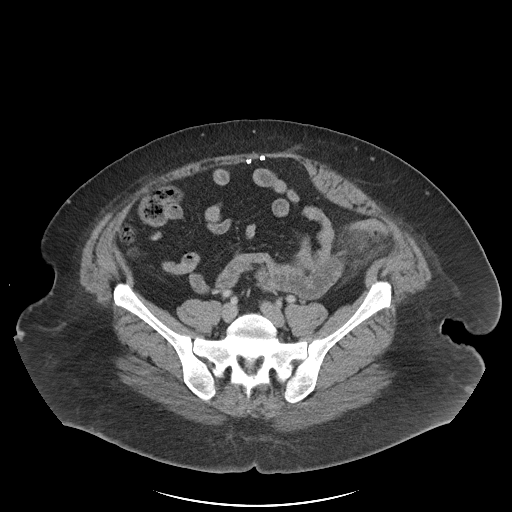
[im 45/89  soft-tissue]
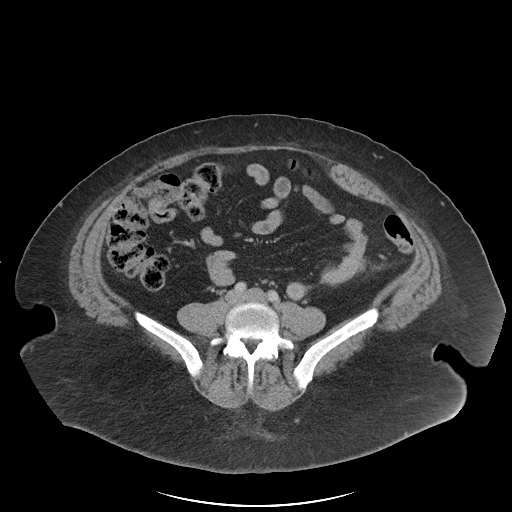
[im 50/89  soft-tissue]
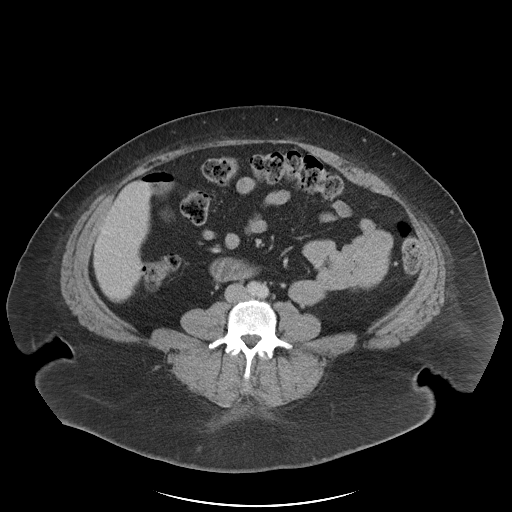
[im 56/89  soft-tissue]
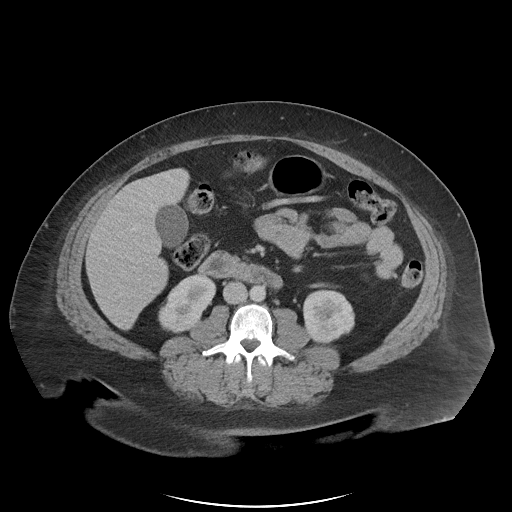
[im 56/89  bone]
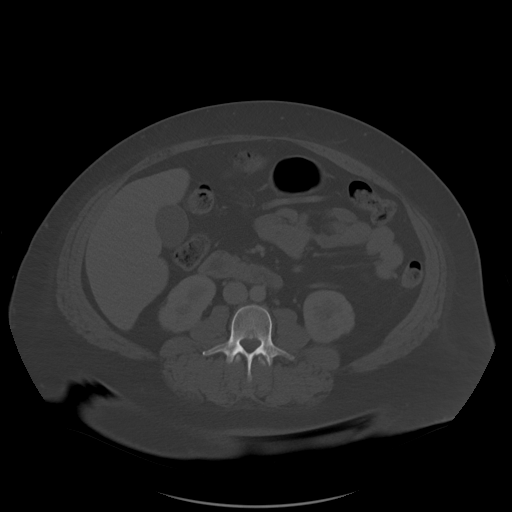
[im 61/89  soft-tissue]
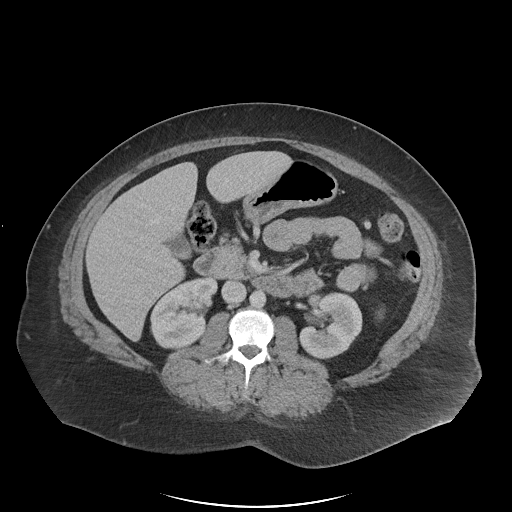
[im 72/89  soft-tissue]
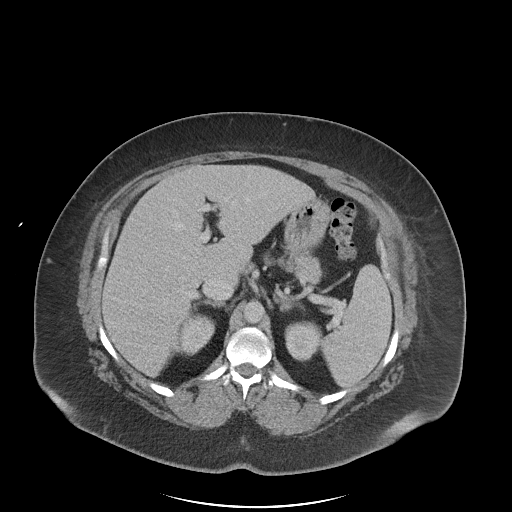
[im 78/89  soft-tissue]
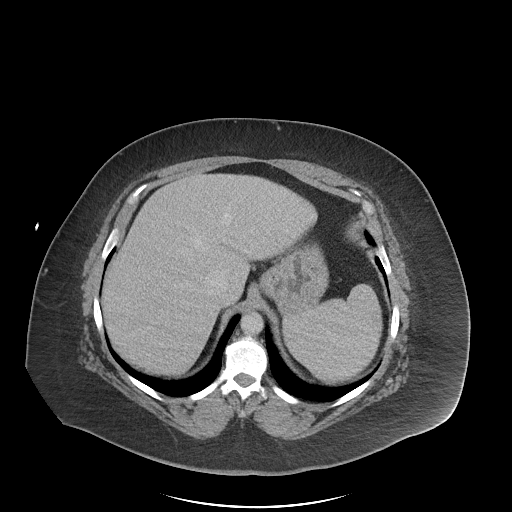
[im 83/89  soft-tissue]
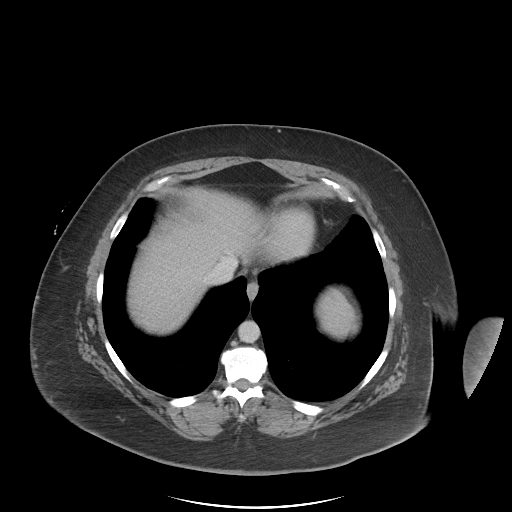

[Series 6: abdomen 3.0 mpr cor · coronal · 0.87mm/px · 3 of 123 slices shown]
[im 41/123  soft-tissue]
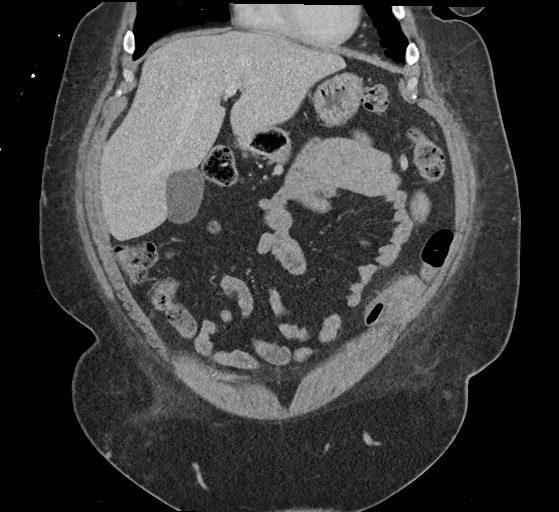
[im 55/123  soft-tissue]
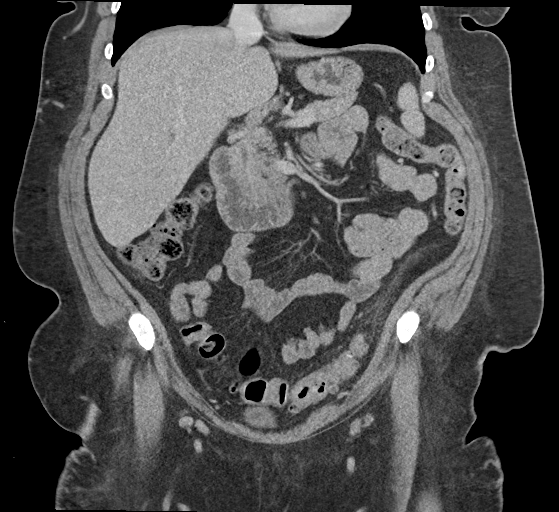
[im 68/123  soft-tissue]
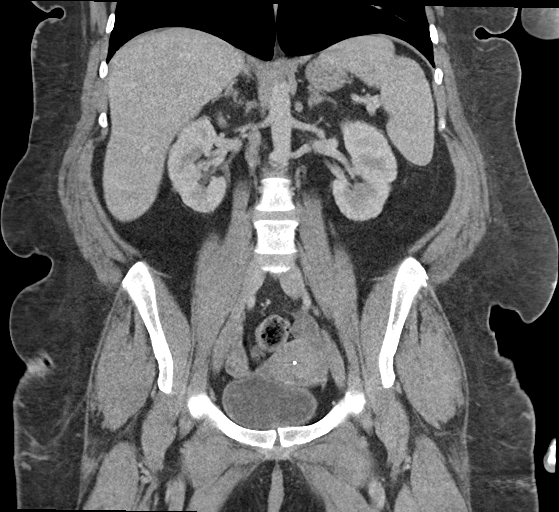

[16 of 46 positions shown; findings below may reference images not displayed]

FINDINGS: Lower chest: No acute abnormality.

Hepatobiliary: No focal liver abnormality. Calcified gallstone noted
within the gallbladder lumen. No gallbladder wall thickening or
pericholecystic fluid. No biliary dilatation.

Pancreas: No focal lesion. Normal pancreatic contour. No surrounding
inflammatory changes. No main pancreatic ductal dilatation.

Spleen: Normal in size without focal abnormality.

Adrenals/Urinary Tract:

No adrenal nodule bilaterally.

Bilateral kidneys enhance symmetrically.

No hydronephrosis. No hydroureter.

The urinary bladder is unremarkable.

On delayed imaging, there is no urothelial wall thickening and there
are no filling defects in the opacified portions of the bilateral
collecting systems or ureters.

Stomach/Bowel: Stomach is within normal limits. No evidence of small
bowel wall thickening or dilatation. Colonic diverticulosis. No
large bowel dilatation. Proximal sigmoid colon bowel wall thickening
surrounding a diverticula with associated pericolonic fat stranding.
No intramural abscess formation. Appendix appears normal.

Vascular/Lymphatic: No abdominal aorta or iliac aneurysm. No
abdominal, pelvic, or inguinal lymphadenopathy.

Reproductive: T-shaped intrauterine device within the expected
region of the endometrial canal within the uterus. Uterus and
bilateral adnexa are unremarkable.

Other: No intraperitoneal free fluid. No intraperitoneal free gas.
No organized fluid collection.

Musculoskeletal:

Anterior abdominal hernia repair with mesh.

No suspicious lytic or blastic osseous lesions. No acute displaced
fracture.
IMPRESSION: 1. Colonic diverticulosis with uncomplicated proximal sigmoid acute
diverticulitis.
2. Cholelithiasis with no CT finding of acute cholecystitis.
3. T-shaped intrauterine device in appropriate position.
4. Anterior abdominal wall hernia repair with mesh.

## 2023-11-10 ENCOUNTER — Encounter: Payer: Self-pay | Admitting: Radiology
